# Patient Record
Sex: Female | Born: 1950 | Race: Black or African American | Hispanic: No | Smoking: Never smoker
Health system: Southern US, Community
[De-identification: ages and names within clinical notes are randomized; demographics above are authoritative.]

## PROBLEM LIST (undated history)

## (undated) DIAGNOSIS — E119 Type 2 diabetes mellitus without complications: Secondary | ICD-10-CM

## (undated) DIAGNOSIS — I214 Non-ST elevation (NSTEMI) myocardial infarction: Secondary | ICD-10-CM

## (undated) DIAGNOSIS — I82409 Acute embolism and thrombosis of unspecified deep veins of unspecified lower extremity: Secondary | ICD-10-CM

## (undated) DIAGNOSIS — D6851 Activated protein C resistance: Secondary | ICD-10-CM

## (undated) DIAGNOSIS — D689 Coagulation defect, unspecified: Secondary | ICD-10-CM

## (undated) DIAGNOSIS — K219 Gastro-esophageal reflux disease without esophagitis: Secondary | ICD-10-CM

## (undated) DIAGNOSIS — K5900 Constipation, unspecified: Secondary | ICD-10-CM

## (undated) DIAGNOSIS — N289 Disorder of kidney and ureter, unspecified: Secondary | ICD-10-CM

## (undated) DIAGNOSIS — E039 Hypothyroidism, unspecified: Secondary | ICD-10-CM

## (undated) DIAGNOSIS — I10 Essential (primary) hypertension: Secondary | ICD-10-CM

## (undated) DIAGNOSIS — G4730 Sleep apnea, unspecified: Secondary | ICD-10-CM

## (undated) HISTORY — DX: Sleep apnea, unspecified: G47.30

## (undated) HISTORY — DX: Disorder of kidney and ureter, unspecified: N28.9

## (undated) HISTORY — DX: Constipation, unspecified: K59.00

## (undated) HISTORY — DX: Gastro-esophageal reflux disease without esophagitis: K21.9

## (undated) HISTORY — DX: Coagulation defect, unspecified: D68.9

## (undated) HISTORY — DX: Activated protein C resistance: D68.51

## (undated) HISTORY — DX: Hypothyroidism, unspecified: E03.9

## (undated) HISTORY — DX: Acute embolism and thrombosis of unspecified deep veins of unspecified lower extremity: I82.409

## (undated) HISTORY — PX: COLONOSCOPY: SHX174

## (undated) HISTORY — DX: Non-ST elevation (NSTEMI) myocardial infarction: I21.4

## (undated) HISTORY — PX: UPPER GASTROINTESTINAL ENDOSCOPY: SHX188

## (undated) HISTORY — PX: INGUINAL HERNIA REPAIR: SUR1180

---

## 1993-02-18 HISTORY — PX: TUBAL LIGATION: SHX77

## 1997-09-13 ENCOUNTER — Other Ambulatory Visit: Admission: RE | Admit: 1997-09-13 | Discharge: 1997-09-13 | Payer: Self-pay | Admitting: *Deleted

## 1998-02-14 ENCOUNTER — Other Ambulatory Visit: Admission: RE | Admit: 1998-02-14 | Discharge: 1998-02-14 | Payer: Self-pay | Admitting: *Deleted

## 1998-12-11 ENCOUNTER — Encounter: Payer: Self-pay | Admitting: Family Medicine

## 1998-12-11 ENCOUNTER — Encounter: Admission: RE | Admit: 1998-12-11 | Discharge: 1998-12-11 | Payer: Self-pay | Admitting: Family Medicine

## 1999-09-19 ENCOUNTER — Other Ambulatory Visit: Admission: RE | Admit: 1999-09-19 | Discharge: 1999-09-19 | Payer: Self-pay | Admitting: Obstetrics and Gynecology

## 1999-09-27 ENCOUNTER — Ambulatory Visit (HOSPITAL_COMMUNITY): Admission: RE | Admit: 1999-09-27 | Discharge: 1999-09-27 | Payer: Self-pay | Admitting: Obstetrics and Gynecology

## 1999-09-27 ENCOUNTER — Encounter: Payer: Self-pay | Admitting: Obstetrics and Gynecology

## 1999-10-10 ENCOUNTER — Encounter: Payer: Self-pay | Admitting: Obstetrics and Gynecology

## 1999-10-10 ENCOUNTER — Encounter: Admission: RE | Admit: 1999-10-10 | Discharge: 1999-10-10 | Payer: Self-pay | Admitting: Obstetrics and Gynecology

## 2001-04-20 ENCOUNTER — Ambulatory Visit (HOSPITAL_COMMUNITY): Admission: RE | Admit: 2001-04-20 | Discharge: 2001-04-20 | Payer: Self-pay | Admitting: *Deleted

## 2001-08-26 ENCOUNTER — Other Ambulatory Visit: Admission: RE | Admit: 2001-08-26 | Discharge: 2001-08-26 | Payer: Self-pay | Admitting: Obstetrics and Gynecology

## 2001-08-28 ENCOUNTER — Other Ambulatory Visit: Admission: RE | Admit: 2001-08-28 | Discharge: 2001-08-28 | Payer: Self-pay | Admitting: Radiology

## 2001-08-28 ENCOUNTER — Encounter: Admission: RE | Admit: 2001-08-28 | Discharge: 2001-08-28 | Payer: Self-pay | Admitting: Obstetrics and Gynecology

## 2001-08-28 ENCOUNTER — Encounter (INDEPENDENT_AMBULATORY_CARE_PROVIDER_SITE_OTHER): Payer: Self-pay | Admitting: Specialist

## 2001-08-28 ENCOUNTER — Encounter: Payer: Self-pay | Admitting: Obstetrics and Gynecology

## 2003-01-26 ENCOUNTER — Inpatient Hospital Stay (HOSPITAL_COMMUNITY): Admission: EM | Admit: 2003-01-26 | Discharge: 2003-02-03 | Payer: Self-pay | Admitting: Emergency Medicine

## 2003-01-27 ENCOUNTER — Encounter (INDEPENDENT_AMBULATORY_CARE_PROVIDER_SITE_OTHER): Payer: Self-pay | Admitting: *Deleted

## 2003-02-08 ENCOUNTER — Encounter: Admission: RE | Admit: 2003-02-08 | Discharge: 2003-02-08 | Payer: Self-pay | Admitting: Family Medicine

## 2003-02-28 ENCOUNTER — Encounter: Admission: RE | Admit: 2003-02-28 | Discharge: 2003-02-28 | Payer: Self-pay | Admitting: Sports Medicine

## 2003-03-16 ENCOUNTER — Encounter: Admission: RE | Admit: 2003-03-16 | Discharge: 2003-03-16 | Payer: Self-pay | Admitting: Family Medicine

## 2003-04-21 ENCOUNTER — Encounter: Admission: RE | Admit: 2003-04-21 | Discharge: 2003-04-21 | Payer: Self-pay | Admitting: Obstetrics and Gynecology

## 2003-12-19 ENCOUNTER — Ambulatory Visit: Payer: Self-pay | Admitting: Sports Medicine

## 2003-12-26 ENCOUNTER — Ambulatory Visit: Payer: Self-pay | Admitting: Family Medicine

## 2004-04-11 ENCOUNTER — Encounter: Admission: RE | Admit: 2004-04-11 | Discharge: 2004-04-11 | Payer: Self-pay | Admitting: Gynecology

## 2004-06-06 ENCOUNTER — Ambulatory Visit: Payer: Self-pay | Admitting: Sports Medicine

## 2004-06-06 ENCOUNTER — Ambulatory Visit (HOSPITAL_COMMUNITY): Admission: RE | Admit: 2004-06-06 | Discharge: 2004-06-06 | Payer: Self-pay | Admitting: Sports Medicine

## 2004-06-07 ENCOUNTER — Ambulatory Visit: Payer: Self-pay | Admitting: Sports Medicine

## 2004-06-18 ENCOUNTER — Ambulatory Visit: Payer: Self-pay | Admitting: Sports Medicine

## 2004-12-31 ENCOUNTER — Ambulatory Visit: Payer: Self-pay | Admitting: Family Medicine

## 2005-01-21 ENCOUNTER — Ambulatory Visit: Payer: Self-pay | Admitting: Family Medicine

## 2005-02-14 ENCOUNTER — Ambulatory Visit: Payer: Self-pay | Admitting: Sports Medicine

## 2005-03-20 ENCOUNTER — Ambulatory Visit: Payer: Self-pay | Admitting: Family Medicine

## 2005-04-09 ENCOUNTER — Ambulatory Visit: Payer: Self-pay | Admitting: Sports Medicine

## 2005-05-19 ENCOUNTER — Encounter (INDEPENDENT_AMBULATORY_CARE_PROVIDER_SITE_OTHER): Payer: Self-pay | Admitting: *Deleted

## 2005-05-19 LAB — CONVERTED CEMR LAB

## 2005-06-05 ENCOUNTER — Ambulatory Visit: Payer: Self-pay | Admitting: Sports Medicine

## 2005-06-13 ENCOUNTER — Ambulatory Visit: Payer: Self-pay | Admitting: Family Medicine

## 2005-06-20 ENCOUNTER — Encounter: Admission: RE | Admit: 2005-06-20 | Discharge: 2005-06-20 | Payer: Self-pay | Admitting: *Deleted

## 2005-07-08 ENCOUNTER — Encounter: Admission: RE | Admit: 2005-07-08 | Discharge: 2005-07-08 | Payer: Self-pay | Admitting: *Deleted

## 2005-07-12 ENCOUNTER — Ambulatory Visit: Payer: Self-pay | Admitting: Family Medicine

## 2005-07-18 ENCOUNTER — Ambulatory Visit: Payer: Self-pay | Admitting: Family Medicine

## 2005-07-31 ENCOUNTER — Ambulatory Visit: Payer: Self-pay | Admitting: Obstetrics and Gynecology

## 2005-11-28 ENCOUNTER — Ambulatory Visit: Payer: Self-pay | Admitting: Family Medicine

## 2005-11-28 ENCOUNTER — Inpatient Hospital Stay (HOSPITAL_COMMUNITY): Admission: EM | Admit: 2005-11-28 | Discharge: 2005-12-01 | Payer: Self-pay | Admitting: Family Medicine

## 2005-11-28 ENCOUNTER — Encounter: Payer: Self-pay | Admitting: Vascular Surgery

## 2005-12-02 ENCOUNTER — Ambulatory Visit: Payer: Self-pay | Admitting: Sports Medicine

## 2005-12-04 ENCOUNTER — Emergency Department (HOSPITAL_COMMUNITY): Admission: EM | Admit: 2005-12-04 | Discharge: 2005-12-05 | Payer: Self-pay | Admitting: Emergency Medicine

## 2005-12-05 ENCOUNTER — Ambulatory Visit: Payer: Self-pay | Admitting: Family Medicine

## 2005-12-07 ENCOUNTER — Emergency Department (HOSPITAL_COMMUNITY): Admission: EM | Admit: 2005-12-07 | Discharge: 2005-12-07 | Payer: Self-pay | Admitting: Emergency Medicine

## 2005-12-16 ENCOUNTER — Ambulatory Visit: Payer: Self-pay | Admitting: Sports Medicine

## 2005-12-19 ENCOUNTER — Ambulatory Visit: Payer: Self-pay | Admitting: Family Medicine

## 2005-12-26 ENCOUNTER — Ambulatory Visit: Payer: Self-pay | Admitting: Sports Medicine

## 2005-12-30 ENCOUNTER — Ambulatory Visit: Payer: Self-pay | Admitting: Family Medicine

## 2006-01-13 ENCOUNTER — Ambulatory Visit: Payer: Self-pay | Admitting: Family Medicine

## 2006-01-27 ENCOUNTER — Ambulatory Visit: Payer: Self-pay | Admitting: Family Medicine

## 2006-02-19 ENCOUNTER — Ambulatory Visit: Payer: Self-pay | Admitting: Family Medicine

## 2006-03-12 ENCOUNTER — Ambulatory Visit: Payer: Self-pay | Admitting: Family Medicine

## 2006-03-26 ENCOUNTER — Ambulatory Visit: Payer: Self-pay | Admitting: Family Medicine

## 2006-04-09 ENCOUNTER — Ambulatory Visit: Payer: Self-pay | Admitting: Family Medicine

## 2006-04-17 DIAGNOSIS — I10 Essential (primary) hypertension: Secondary | ICD-10-CM | POA: Insufficient documentation

## 2006-04-17 DIAGNOSIS — E039 Hypothyroidism, unspecified: Secondary | ICD-10-CM | POA: Insufficient documentation

## 2006-04-17 DIAGNOSIS — I80299 Phlebitis and thrombophlebitis of other deep vessels of unspecified lower extremity: Secondary | ICD-10-CM | POA: Insufficient documentation

## 2006-04-18 ENCOUNTER — Encounter (INDEPENDENT_AMBULATORY_CARE_PROVIDER_SITE_OTHER): Payer: Self-pay | Admitting: *Deleted

## 2006-04-24 ENCOUNTER — Ambulatory Visit: Payer: Self-pay | Admitting: Sports Medicine

## 2006-04-24 LAB — CONVERTED CEMR LAB
INR: 1.7
Prothrombin Time: 20.9 s

## 2006-05-07 ENCOUNTER — Encounter: Payer: Self-pay | Admitting: Family Medicine

## 2006-05-07 ENCOUNTER — Ambulatory Visit: Payer: Self-pay | Admitting: Family Medicine

## 2006-05-07 LAB — CONVERTED CEMR LAB
INR: 2.3
Prothrombin Time: 27.6 s
TSH: 2.635 microintl units/mL (ref 0.350–5.50)

## 2006-05-08 ENCOUNTER — Telehealth: Payer: Self-pay | Admitting: Family Medicine

## 2006-05-21 ENCOUNTER — Ambulatory Visit: Payer: Self-pay | Admitting: Family Medicine

## 2006-05-21 LAB — CONVERTED CEMR LAB: INR: 1.5

## 2006-06-13 ENCOUNTER — Ambulatory Visit: Payer: Self-pay | Admitting: Family Medicine

## 2006-07-28 ENCOUNTER — Emergency Department (HOSPITAL_COMMUNITY): Admission: EM | Admit: 2006-07-28 | Discharge: 2006-07-29 | Payer: Self-pay | Admitting: Emergency Medicine

## 2006-07-29 ENCOUNTER — Telehealth: Payer: Self-pay | Admitting: *Deleted

## 2006-07-30 ENCOUNTER — Ambulatory Visit: Payer: Self-pay | Admitting: Family Medicine

## 2006-07-30 DIAGNOSIS — I872 Venous insufficiency (chronic) (peripheral): Secondary | ICD-10-CM | POA: Insufficient documentation

## 2006-09-26 ENCOUNTER — Telehealth (INDEPENDENT_AMBULATORY_CARE_PROVIDER_SITE_OTHER): Payer: Self-pay | Admitting: *Deleted

## 2006-09-29 ENCOUNTER — Ambulatory Visit: Payer: Self-pay | Admitting: Sports Medicine

## 2006-09-29 DIAGNOSIS — I808 Phlebitis and thrombophlebitis of other sites: Secondary | ICD-10-CM | POA: Insufficient documentation

## 2006-11-20 ENCOUNTER — Encounter: Admission: RE | Admit: 2006-11-20 | Discharge: 2006-11-20 | Payer: Self-pay | Admitting: Internal Medicine

## 2007-11-25 ENCOUNTER — Encounter: Admission: RE | Admit: 2007-11-25 | Discharge: 2007-11-25 | Payer: Self-pay | Admitting: Internal Medicine

## 2007-11-27 ENCOUNTER — Ambulatory Visit: Payer: Self-pay | Admitting: Internal Medicine

## 2007-11-27 DIAGNOSIS — R05 Cough: Secondary | ICD-10-CM

## 2007-11-27 DIAGNOSIS — R059 Cough, unspecified: Secondary | ICD-10-CM | POA: Insufficient documentation

## 2007-11-27 LAB — CONVERTED CEMR LAB
Basophils Absolute: 0 10*3/uL (ref 0.0–0.1)
Basophils Relative: 0 % (ref 0.0–3.0)
Eosinophils Absolute: 0.1 10*3/uL (ref 0.0–0.7)
Eosinophils Relative: 1.8 % (ref 0.0–5.0)
HCT: 37.4 % (ref 36.0–46.0)
Hemoglobin: 12.6 g/dL (ref 12.0–15.0)
IgE (Immunoglobulin E), Serum: 323.6 intl units/mL — ABNORMAL HIGH (ref 0.0–180.0)
Lymphocytes Relative: 30.4 % (ref 12.0–46.0)
MCHC: 33.5 g/dL (ref 30.0–36.0)
MCV: 85.7 fL (ref 78.0–100.0)
Monocytes Absolute: 0.7 10*3/uL (ref 0.1–1.0)
Monocytes Relative: 12.2 % — ABNORMAL HIGH (ref 3.0–12.0)
Neutro Abs: 3.2 10*3/uL (ref 1.4–7.7)
Neutrophils Relative %: 55.6 % (ref 43.0–77.0)
Platelets: 327 10*3/uL (ref 150–400)
RBC: 4.37 M/uL (ref 3.87–5.11)
RDW: 13.7 % (ref 11.5–14.6)
WBC: 5.8 10*3/uL (ref 4.5–10.5)

## 2007-12-03 ENCOUNTER — Ambulatory Visit: Payer: Self-pay | Admitting: Internal Medicine

## 2008-01-05 ENCOUNTER — Ambulatory Visit: Payer: Self-pay | Admitting: Internal Medicine

## 2008-06-17 ENCOUNTER — Emergency Department (HOSPITAL_COMMUNITY): Admission: EM | Admit: 2008-06-17 | Discharge: 2008-06-18 | Payer: Self-pay | Admitting: Emergency Medicine

## 2008-06-18 ENCOUNTER — Emergency Department (HOSPITAL_COMMUNITY): Admission: EM | Admit: 2008-06-18 | Discharge: 2008-06-18 | Payer: Self-pay | Admitting: Emergency Medicine

## 2008-07-27 ENCOUNTER — Encounter: Admission: RE | Admit: 2008-07-27 | Discharge: 2008-07-27 | Payer: Self-pay | Admitting: Internal Medicine

## 2008-07-27 ENCOUNTER — Inpatient Hospital Stay (HOSPITAL_COMMUNITY): Admission: EM | Admit: 2008-07-27 | Discharge: 2008-07-28 | Payer: Self-pay | Admitting: Emergency Medicine

## 2008-07-28 ENCOUNTER — Encounter (INDEPENDENT_AMBULATORY_CARE_PROVIDER_SITE_OTHER): Payer: Self-pay | Admitting: Internal Medicine

## 2008-09-28 ENCOUNTER — Ambulatory Visit: Payer: Self-pay | Admitting: Oncology

## 2008-10-11 LAB — LUPUS ANTICOAGULANT PANEL
DRVVT 1:1 Mix: 39.8 secs (ref 36.1–47.0)
DRVVT: 60.5 secs — ABNORMAL HIGH (ref 36.1–47.0)
PTT Lupus Anticoagulant: 45.8 secs (ref 36.3–48.8)

## 2008-10-11 LAB — PROTHROMBIN GENE MUTATION

## 2008-10-11 LAB — BETA-2 GLYCOPROTEIN ANTIBODIES
Beta-2 Glyco I IgG: <20 U/mL (ref <20)
Beta-2-Glycoprotein I IgA: <10 U/mL (ref <20)
Beta-2-Glycoprotein I IgM: <10 U/mL (ref <20)

## 2008-10-11 LAB — FACTOR 8 ASSAY: Coagulation Factor VIII: 215 % — ABNORMAL HIGH (ref 73–140)

## 2009-02-18 HISTORY — PX: TRANSTHORACIC ECHOCARDIOGRAM: SHX275

## 2009-02-18 HISTORY — PX: OTHER SURGICAL HISTORY: SHX169

## 2009-06-21 ENCOUNTER — Ambulatory Visit: Payer: Self-pay | Admitting: Oncology

## 2009-06-21 ENCOUNTER — Other Ambulatory Visit: Payer: Self-pay | Admitting: Oncology

## 2009-06-21 ENCOUNTER — Ambulatory Visit: Payer: Self-pay | Admitting: Vascular Surgery

## 2009-06-21 ENCOUNTER — Ambulatory Visit: Admission: RE | Admit: 2009-06-21 | Discharge: 2009-06-21 | Payer: Self-pay | Admitting: Oncology

## 2009-06-21 ENCOUNTER — Encounter: Payer: Self-pay | Admitting: Oncology

## 2009-06-21 LAB — CBC WITH DIFFERENTIAL/PLATELET
BASO%: 0.3 % (ref 0.0–2.0)
Basophils Absolute: 0 10*3/uL (ref 0.0–0.1)
EOS%: 1.9 % (ref 0.0–7.0)
Eosinophils Absolute: 0.1 10*3/uL (ref 0.0–0.5)
HCT: 37.5 % (ref 34.8–46.6)
HGB: 12.6 g/dL (ref 11.6–15.9)
LYMPH%: 38.7 % (ref 14.0–49.7)
MCH: 27.5 pg (ref 25.1–34.0)
MCHC: 33.6 g/dL (ref 31.5–36.0)
MCV: 81.7 fL (ref 79.5–101.0)
MONO#: 0.5 10*3/uL (ref 0.1–0.9)
MONO%: 7.7 % (ref 0.0–14.0)
NEUT#: 3 10*3/uL (ref 1.5–6.5)
NEUT%: 51.4 % (ref 38.4–76.8)
Platelets: 318 10*3/uL (ref 145–400)
RBC: 4.59 10*6/uL (ref 3.70–5.45)
RDW: 14.7 % — ABNORMAL HIGH (ref 11.2–14.5)
WBC: 5.8 10*3/uL (ref 3.9–10.3)
lymph#: 2.3 10*3/uL (ref 0.9–3.3)
nRBC: 0 % (ref 0–0)

## 2009-06-21 LAB — PROTIME-INR
INR: 1 — ABNORMAL LOW (ref 2.00–3.50)
Protime: 12 Seconds (ref 10.6–13.4)

## 2009-06-22 LAB — COMPREHENSIVE METABOLIC PANEL
ALT: 14 U/L (ref 0–35)
AST: 21 U/L (ref 0–37)
Albumin: 4.3 g/dL (ref 3.5–5.2)
Alkaline Phosphatase: 65 U/L (ref 39–117)
BUN: 14 mg/dL (ref 6–23)
CO2: 24 mEq/L (ref 19–32)
Calcium: 9.3 mg/dL (ref 8.4–10.5)
Chloride: 105 mEq/L (ref 96–112)
Creatinine, Ser: 1.13 mg/dL (ref 0.40–1.20)
Glucose, Bld: 121 mg/dL — ABNORMAL HIGH (ref 70–99)
Potassium: 3.8 mEq/L (ref 3.5–5.3)
Sodium: 139 mEq/L (ref 135–145)
Total Bilirubin: 0.5 mg/dL (ref 0.3–1.2)
Total Protein: 7.6 g/dL (ref 6.0–8.3)

## 2009-06-22 LAB — D-DIMER, QUANTITATIVE: D-Dimer, Quant: 0.3 ug/mL-FEU (ref 0.00–0.48)

## 2009-06-22 LAB — FACTOR 8 ASSAY: Coagulation Factor VIII: 206 % — ABNORMAL HIGH (ref 73–140)

## 2009-06-22 LAB — FIBRINOGEN: Fibrinogen: 444 mg/dL (ref 204–475)

## 2009-08-07 ENCOUNTER — Encounter: Payer: Self-pay | Admitting: Internal Medicine

## 2009-09-05 IMAGING — US US EXTREM LOW VENOUS*R*
1 series · 14 of 24 positions shown · non-contrast
Comparison: None.

CLINICAL DATA: Right leg pain



[Series 1: us extrem low venous*right* · 14 of 32 slices shown]
[im 1/32]
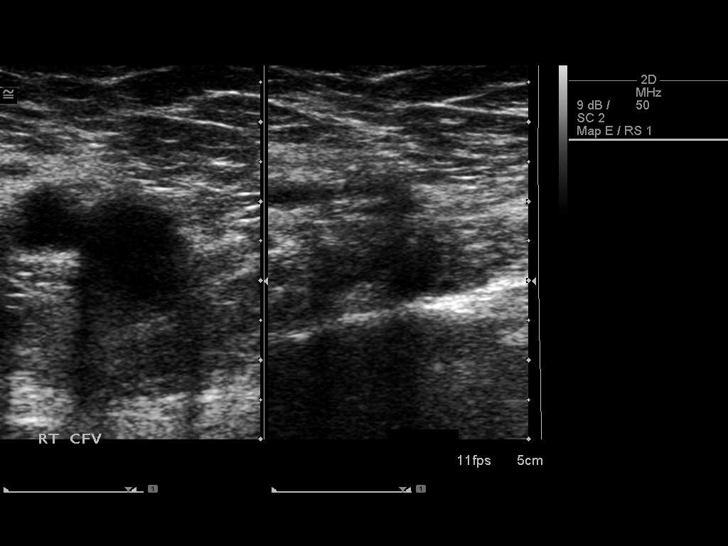
[im 3/32]
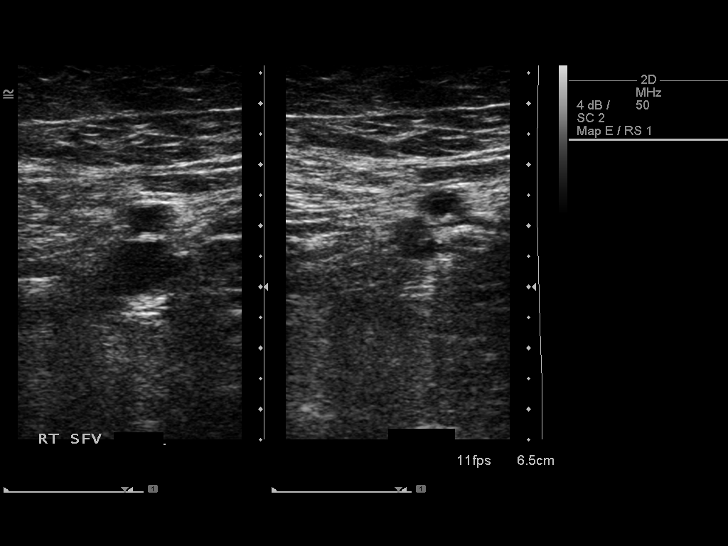
[im 6/32]
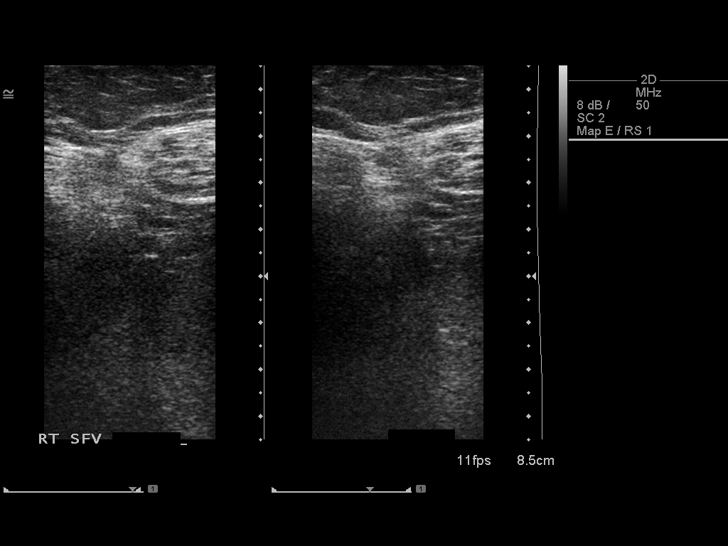
[im 9/32]
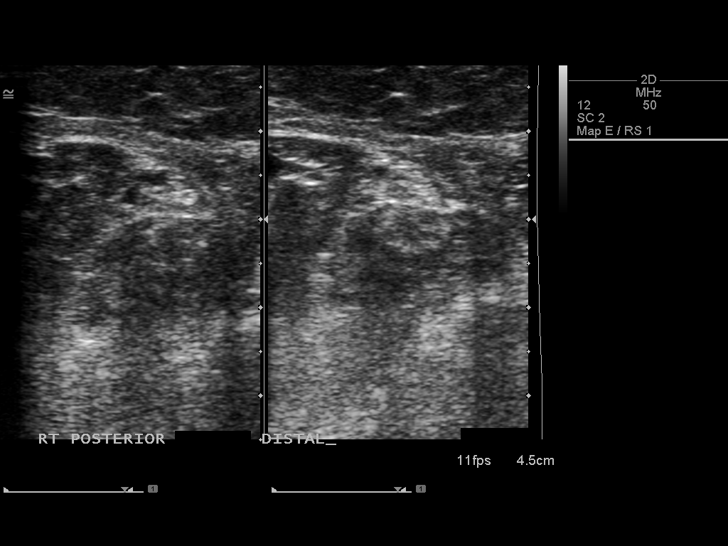
[im 10/32]
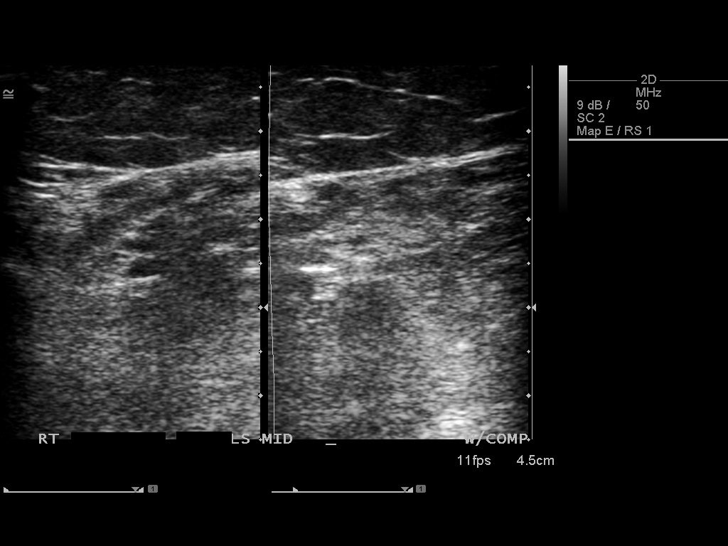
[im 13/32]
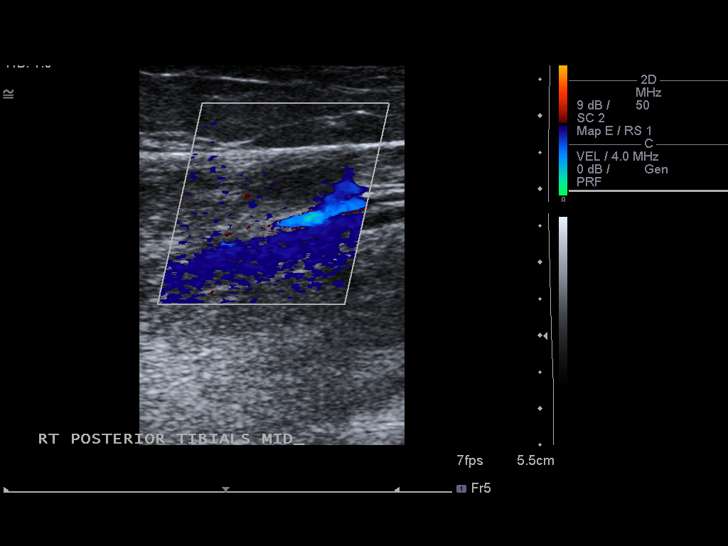
[im 15/32]
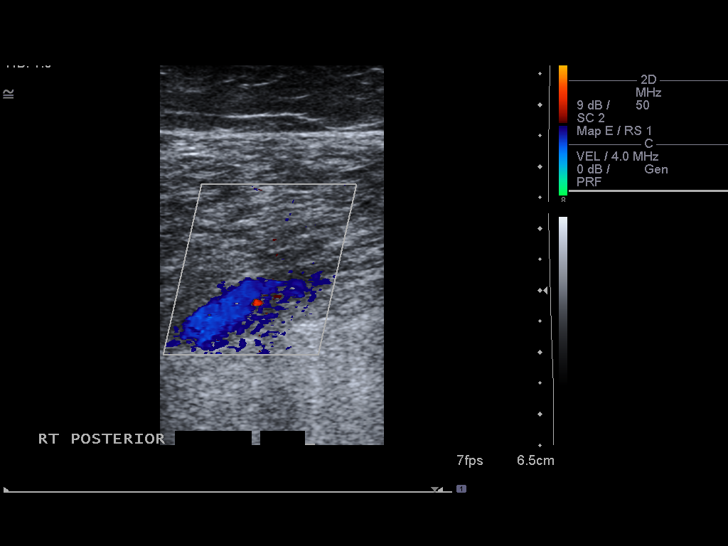
[im 17/32]
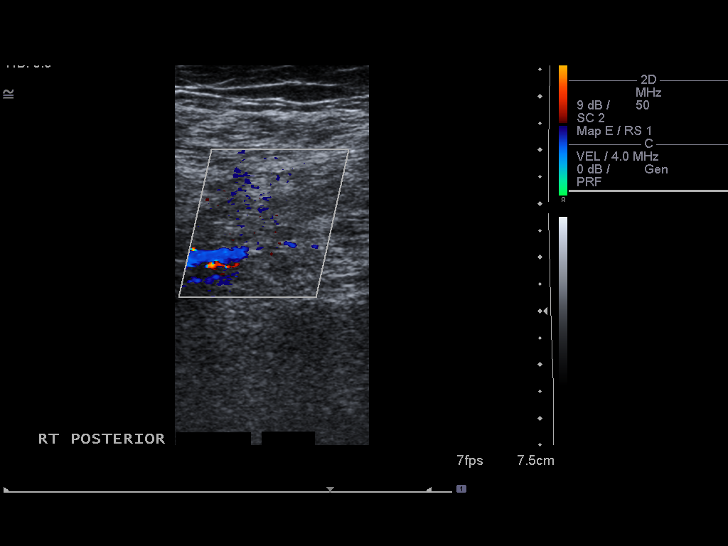
[im 19/32]
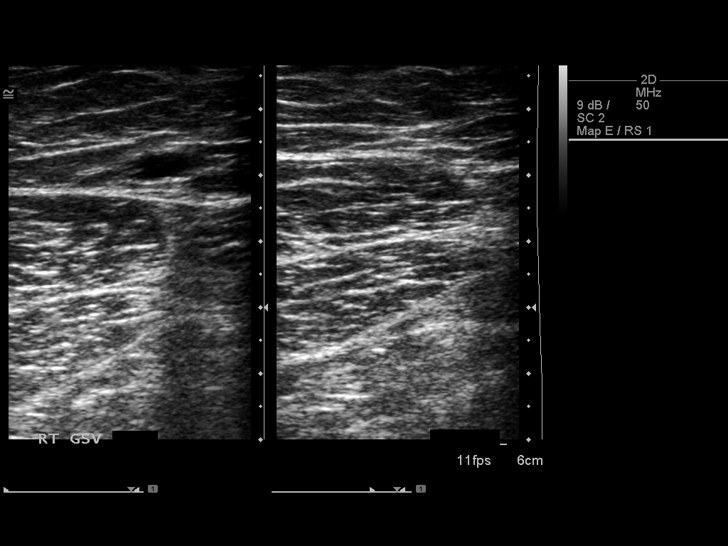
[im 22/32]
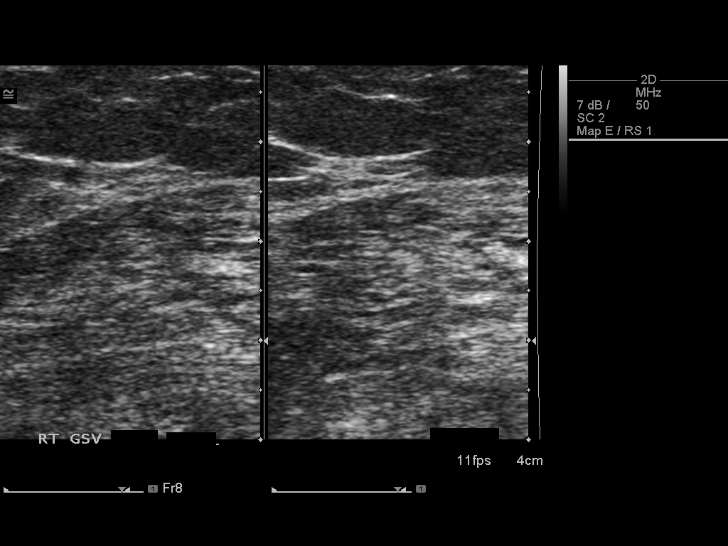
[im 25/32]
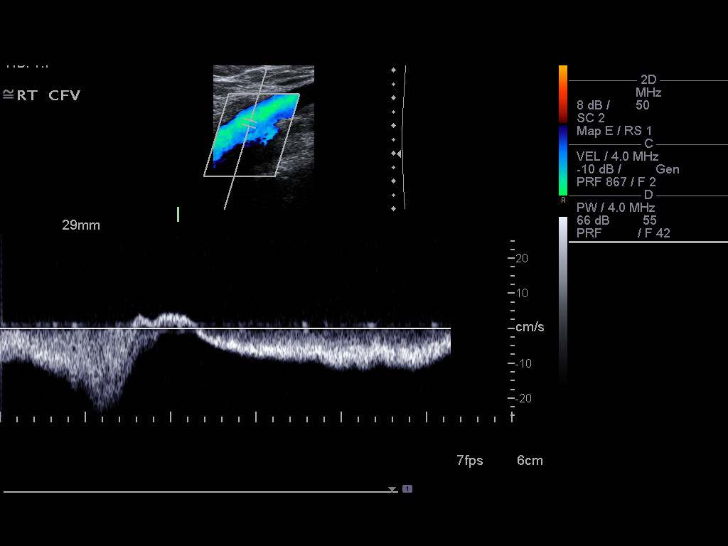
[im 26/32]
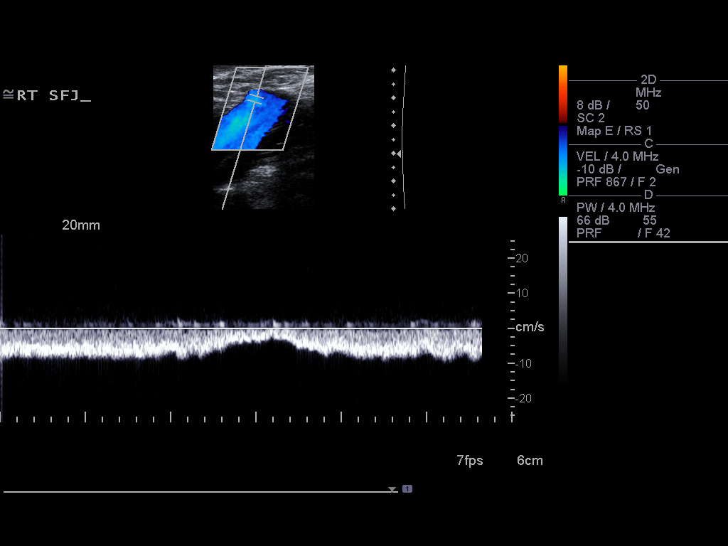
[im 29/32]
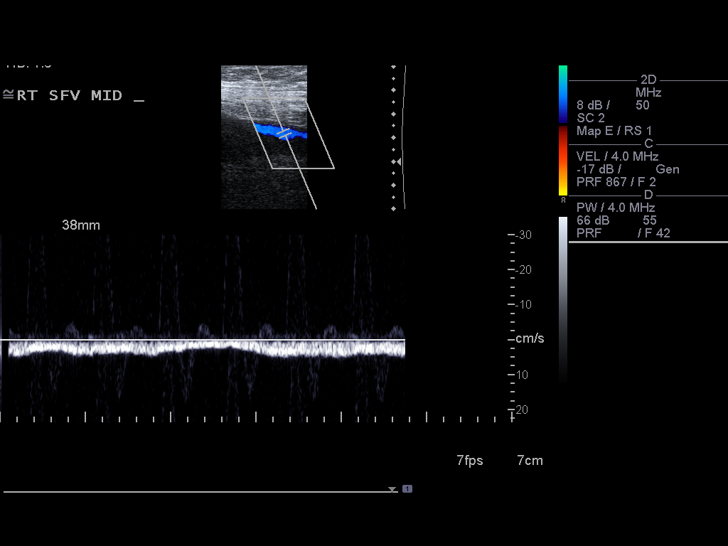
[im 32/32]
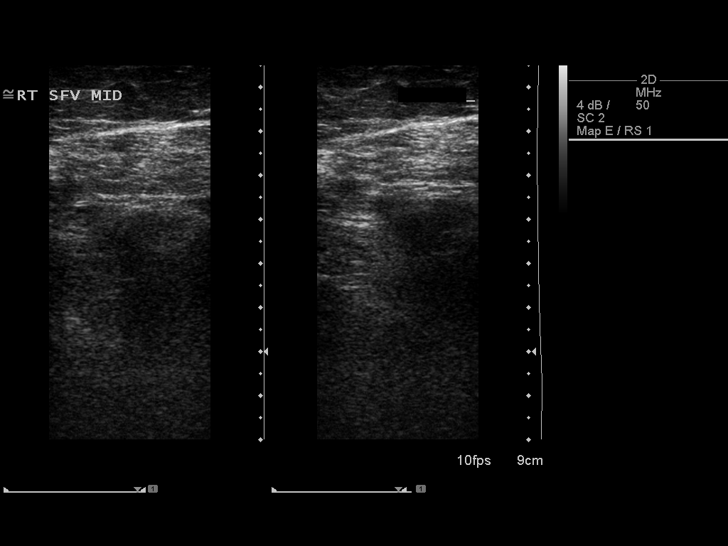

[14 of 24 positions shown; findings below may reference images not displayed]

FINDINGS: The right saphenofemoral junction, common femoral and
profunda femoral veins are normal with compressibility and
phasicity.  Within the mid and distal right superficial femoral
vein, there is hypoechoic nonocclusive clot identified.  There is
venous flow adjacent to the nonocclusive clot.  The right G S V is
normal.  Posterior tibial veins are also patent and compressible.
IMPRESSION: Right mid and distal superficial femoral nonocclusive DVT. This may
be subacute and already partially recanalized.

Critical test results telephoned to Dr. Rautela at the time of
interpretation on 07/27/2008 at [DATE] p.m.

## 2009-09-12 ENCOUNTER — Encounter: Payer: Self-pay | Admitting: Internal Medicine

## 2009-10-09 ENCOUNTER — Encounter: Payer: Self-pay | Admitting: Internal Medicine

## 2009-10-21 ENCOUNTER — Inpatient Hospital Stay (HOSPITAL_COMMUNITY): Admission: EM | Admit: 2009-10-21 | Discharge: 2009-10-28 | Payer: Self-pay | Admitting: Cardiovascular Disease

## 2009-10-21 ENCOUNTER — Encounter: Payer: Self-pay | Admitting: Emergency Medicine

## 2009-10-21 HISTORY — PX: CARDIAC CATHETERIZATION: SHX172

## 2009-10-24 ENCOUNTER — Encounter (INDEPENDENT_AMBULATORY_CARE_PROVIDER_SITE_OTHER): Payer: Self-pay | Admitting: Cardiovascular Disease

## 2009-10-27 ENCOUNTER — Telehealth (INDEPENDENT_AMBULATORY_CARE_PROVIDER_SITE_OTHER): Payer: Self-pay | Admitting: *Deleted

## 2009-11-17 ENCOUNTER — Encounter: Admission: RE | Admit: 2009-11-17 | Discharge: 2009-11-17 | Payer: Self-pay | Admitting: Internal Medicine

## 2009-11-19 ENCOUNTER — Encounter (HOSPITAL_COMMUNITY)
Admission: RE | Admit: 2009-11-19 | Discharge: 2010-02-17 | Payer: Self-pay | Source: Home / Self Care | Attending: Internal Medicine | Admitting: Internal Medicine

## 2010-01-02 ENCOUNTER — Ambulatory Visit (HOSPITAL_COMMUNITY): Admission: RE | Admit: 2010-01-02 | Discharge: 2010-01-02 | Payer: Self-pay | Admitting: Cardiovascular Disease

## 2010-02-18 ENCOUNTER — Encounter (HOSPITAL_COMMUNITY)
Admission: RE | Admit: 2010-02-18 | Discharge: 2010-03-20 | Payer: Self-pay | Source: Home / Self Care | Attending: Internal Medicine | Admitting: Internal Medicine

## 2010-02-23 LAB — GLUCOSE, CAPILLARY: Glucose-Capillary: 148 mg/dL — ABNORMAL HIGH (ref 70–99)

## 2010-03-11 ENCOUNTER — Encounter: Payer: Self-pay | Admitting: Obstetrics and Gynecology

## 2010-03-11 ENCOUNTER — Encounter: Payer: Self-pay | Admitting: *Deleted

## 2010-03-13 LAB — GLUCOSE, CAPILLARY: Glucose-Capillary: 127 mg/dL — ABNORMAL HIGH (ref 70–99)

## 2010-03-21 ENCOUNTER — Encounter (HOSPITAL_COMMUNITY): Payer: Self-pay | Attending: Internal Medicine

## 2010-03-21 DIAGNOSIS — Z7901 Long term (current) use of anticoagulants: Secondary | ICD-10-CM | POA: Insufficient documentation

## 2010-03-21 DIAGNOSIS — Z5189 Encounter for other specified aftercare: Secondary | ICD-10-CM | POA: Insufficient documentation

## 2010-03-21 DIAGNOSIS — D649 Anemia, unspecified: Secondary | ICD-10-CM | POA: Insufficient documentation

## 2010-03-21 DIAGNOSIS — E039 Hypothyroidism, unspecified: Secondary | ICD-10-CM | POA: Insufficient documentation

## 2010-03-21 DIAGNOSIS — I214 Non-ST elevation (NSTEMI) myocardial infarction: Secondary | ICD-10-CM | POA: Insufficient documentation

## 2010-03-21 DIAGNOSIS — N289 Disorder of kidney and ureter, unspecified: Secondary | ICD-10-CM | POA: Insufficient documentation

## 2010-03-21 DIAGNOSIS — Z86718 Personal history of other venous thrombosis and embolism: Secondary | ICD-10-CM | POA: Insufficient documentation

## 2010-03-21 DIAGNOSIS — E119 Type 2 diabetes mellitus without complications: Secondary | ICD-10-CM | POA: Insufficient documentation

## 2010-03-21 DIAGNOSIS — G4733 Obstructive sleep apnea (adult) (pediatric): Secondary | ICD-10-CM | POA: Insufficient documentation

## 2010-03-21 DIAGNOSIS — I059 Rheumatic mitral valve disease, unspecified: Secondary | ICD-10-CM | POA: Insufficient documentation

## 2010-03-22 NOTE — Progress Notes (Signed)
  Phone Note Other Incoming   Request: Send information Summary of Call: Request for records received from [facility]. Request forwarded to [facility].     

## 2010-03-22 NOTE — Progress Notes (Signed)
Summary: wi request  Phone Note Call from Patient Call back at Home Phone [phone]   Reason for Call: Talk to Nurse Summary of Call: pt is requesting wi appt, she sts she went to the ER last night & was told to fu with her doctor Initial call taken by: ERIN LEVAN,  July 29, 2006 2:09 PM  Follow-up for Phone Call        message left to return call Follow-up by: Theresia Lo RN,  July 29, 2006 2:34 PM  Additional Follow-up for Phone Call Additional follow up Details #1::        reports went to ER last night for severe rt calf pain and ankle swelling. has history of DVT. no evidence of DVT noted last night. was told to f/u with PCP. appointment scheduled tomorrow. Additional Follow-up by: Theresia Lo RN,  July 29, 2006 3:05 PM

## 2010-03-22 NOTE — Assessment & Plan Note (Signed)
Summary: clot to rt arm , discoloration/ls    Vital Signs:  Patient Profile:   60 Years Old Female Height:     64.75 inches Weight:      197 pounds BMI:     33.16 Pulse rate:   80 / minute BP sitting:   142 / 81  Vitals Entered By: Lillia Pauls CMA (September 29, 2006 9:43 AM)               Chief Complaint:  L ARM BRUISE AND HARDNESS WITH PAIN.  History of Present Illness: 60 yo AAF with hx of HTN, DVT ( 2 to trauma , on coumadin from 10/07 to 04/08) c/o L forearm spontaneous echymoses , now with a "lump" . Ecchymosis has been present for 3 wks (  ~ a 3 cm diameter area on the upper 1/3 of the L forearm) . She has noticed a nodule , mildly tender on palpation in the center of the echymosis.   Denies trauma , SOB, CP, L extremity pain/ sensory or motor deficit.  There is a nodule of  ~ 6 to 8 mm of diammeter in the center of the ecchymosis of top of a superficial vein.  NO swelling/ erythema/ calor. Denies fever         Physical Exam  General:     Well-developed,well-nourished,in no acute distress; alert,appropriate and cooperative throughout examination Lungs:     Normal respiratory effort, chest expands symmetrically. Lungs are clear to auscultation, no crackles or wheezes. Heart:     Normal rate and regular rhythm. S1 and S2 normal without gallop, murmur, click, rub or other extra sounds. Pulses:     2 + peripheral pulses x 4  Extremities:     no edema Neurologic:     alert & oriented X3.   5/5 x 4 . NO sensory or motor deficits. Skin:     1-   ~ a 3 cm diameter area of ecchymosis  on the upper 1/3 of the L forearm ( extension surface) 2- nodule of  ~ 6 to 8 mm of diammeter in the center of the ecchymosis of top of a superficial vein. Hard rubber consistency, partially  attached to underlying tissue , mildly tender on palpation     Impression & Recommendations:  Problem # 1:  PHLEBITIS, SUPERFICIAL VEINS, UPPER LIMB (ICD-451.82) Based on physical findings ,  ecchymosis plus nodule most likely 2 to superficial phlebitis now on resolution. Preceptor : Dr. Leveda Anna. Plan: observation. Nodule and ecchymosis are spected to reduce their size with time. PCP to f/u. If increased size or erythema, swelling, calor, tenderness, sob, cp pt is to return to [facility] or ER. Pt will make app. for F/u in 3 wks and . Also  to meet new MD           Appended Document: Orders Update     Clinical Lists Changes  Orders: Added new Test order of [facility]- Est Level  3 (04540) - Signed

## 2010-03-22 NOTE — Progress Notes (Signed)
Summary: WI request   Phone Note Call from Patient Call back at [phone]   Reason for Call: Talk to Nurse Summary of Call: pt thinks she has a blood clot, isn't sure though Initial call taken by: Haydee Salter,  September 26, 2006 10:59 AM  Follow-up for Phone Call        Pt states she has a bruise on her left forearm that she sustained approx one week ago.  She noticed a couple of days ago that a knot had developed underneath the bruise, and her whole arm started hurting.  She is concerned about blood clot, states the area where the bruise is feels warm.  Worked pt in today at 1:30pm.  Follow-up by: Jacki Cones RN,  September 26, 2006 11:27 AM

## 2010-03-22 NOTE — Assessment & Plan Note (Signed)
Summary: skin test/apc   Vital Signs:  Patient Profile:   60 Years Old Female Height:     64.75 inches Weight:      205.38 pounds O2 Sat:      100 % O2 treatment:    Room Air Pulse rate:   87 / minute BP sitting:   130 / 78  (left arm) Cuff size:   regular  Vitals Entered By: Reynaldo Minium CMA (January 05, 2008 3:06 PM)             Comments Medications reviewed with patient Reynaldo Minium CMA  January 05, 2008 3:06 PM      PCP:  Kellie Shropshire  Chief Complaint:  allergy skin testing.  History of Present Illness:   From 11/27/07- 11/27/07- 33 year old woman self-referred for allergy evaluation.  New onset of cough, 8 to 10 weeks ago.  No prior history of allergy or asthma.  She thought she had a cold.  Antihistamines help, but cough returns when they are stopped.  Throat itches.  Eyes are red.  Denies sneeze, wheeze, phlegm.  No shortness of breath.  Comfortable supine.  Much indigestion, with a hiatal hernia.Taking a "digestive cleanser". House 1983, crawl space, gas heat, denies mold, pets, smokers  01/05/08- cough, hx DVT- For ST On amoxacillin from dentist.  CBC 10/9- EOS 1.8%. Immunocap- POS- mite, dust, [place] grass. IgE 323.6 CXR NAD, mild volume loss R base.  PFT 10/15-Mild restriction, reduced DLCO 66%.  Skin Test- POS - grass, tree, dust mite.      Prior Medications Reviewed Using: Patient Recall  Prior Medication List:  HYDROCHLOROTHIAZIDE 12.5 MG TABS (HYDROCHLOROTHIAZIDE) take 1 by mouth once daily SYNTHROID 175 MCG TABS (LEVOTHYROXINE SODIUM) take 1 by mouth once daily   Updated Prior Medication List: HYDROCHLOROTHIAZIDE 12.5 MG TABS (HYDROCHLOROTHIAZIDE) take 1 by mouth once daily SYNTHROID 175 MCG TABS (LEVOTHYROXINE SODIUM) take 1 by mouth once daily  Current Allergies (reviewed today): No known allergies   Past Medical History:    Reviewed history from 11/27/2007 and no changes required:       Fe deficient anemia in hospital as well,   h/o polio, age 58,        normal EKG July 06,        Pyelonephritis in Dec `04,        R leg length descrepancy 2 cm,        TSH 19.7 11/06       COUGH (ICD-786.2)- POS skin test 01/05/08       HYPOTHYROIDISM, UNSPECIFIED (ICD-244.9)       HYPERTENSION, BENIGN SYSTEMIC (ICD-401.1)       DEEP VEIN THROMBOPHLEBITIS, 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)       PHLEBITIS, SUPERFICIAL VEINS, UPPER LIMB (ICD-451.82)       VENOUS INSUFFICIENCY, CHRONIC, RIGHT LEG (ICD-459.81)       AFTERCARE, LONG-TERM USE, ANTICOAGULANTS (ICD-V58.61)            Review of Systems      See HPI   Physical Exam  General: A/Ox3; pleasant and cooperative, NAD, SKIN: no rash, lesions NODES: no lymphadenopathy HEENT: [place]/AT, EOM- WNL, Conjuctivae- clear, PERRLA, TM-WNL, Nose- clear, Throat- clear and wnl NECK: Supple w/ fair ROM, JVD- none, normal carotid impulses w/o bruits Thyroid- normal to palpation CHEST: Clear to P&A HEART: RRR, no m/g/r heard      Impression & Recommendations:  Problem # 1:  COUGH (ICD-786.2) Elevated IgE and positive skin tests would support allergic rhinitis or cough equivalent  asthma as basis for cough, but would not explain her hx that cough is of very recent onset wiithout change in exposure. We will give inhaled steroid, try antihistamine and educate on dust and pollen control, watching for effect.  Medications Added to Medication List This Visit: 1)  Qvar 80 Mcg/act Aers (Beclomethasone dipropionate) .Marland Kitchen.. 1 puff and rinse twice daily   Patient Instructions: 1)  Please schedule a follow-up appointment in 1 month. 2)  Sample Qvar 80, 1 puff and rinse twice daily 3)  Clarinex after skin test today 4)  Try Claritin antihistamine otc  as needed for cough to see if it helps 5)  Consider info I gave on avoiding dust and pollens   Prescriptions: QVAR 80 MCG/ACT AERS (BECLOMETHASONE DIPROPIONATE) 1 puff and rinse twice daily  #1 x prn   Entered and Authorized by:   Waymon Budge MD   Signed  by:   Waymon Budge MD on 01/05/2008   Method used:   Historical   RxID:   [id number]  ]  SPIROMETRY RESULTS  Date:12/03/2007 height inches: 64.75 Gender: Female  Parameter  Measured Predicted %Predicted  FVC      2.52        3.25      78  FEV1      2.22        2.58      86  FEV1/FVC      0.88        0.79      111.00  FEF25-75              2.58      0  PEF              398      0  Post-Bronchodilator  Parameter Measured Change from Baseline  FVC               FEV1               FEV1/FVC                FEF25-75                PEF               Pulse Oximetry Pulse: 87 O2 Saturation %: 100

## 2010-03-22 NOTE — Miscellaneous (Signed)
Summary: Skin Tests/[facility]  Skin Tests/ Allergy   Imported By: Lanelle Bal 01/27/2008 15:07:02  _____________________________________________________________________  External Attachment:    Type:   Image     Comment:   External Document

## 2010-03-22 NOTE — Assessment & Plan Note (Signed)
Summary: discuss d/c coumadin/dsl   Vital Signs:  Patient Profile:   60 Years Old Female Height:     64.75 inches Weight:      195.3 pounds Temp:     98.1 degrees F Pulse rate:   81 / minute BP sitting:   125 / 77  (right arm)  Pt. in pain?   yes    Location:   back    Intensity:   2    Type:       dull  Vitals Entered By: Theresia Lo RN (June 13, 2006 11:11 AM)                Chief Complaint:  discuss d/c coumadin.  History of Present Illness: On coumadin since 10/07 for RLE DVT.  Has completed 6 months of therapy.  Here for recheck.  Swelling has diminished but is still somewhat present.  Still having some pain along the anterior medial shin that is superficial in nature, not related to movement, but hurts with pressure on the superficial veins.    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  No hisotry of abnormal pap or STD   Risk Factors:  Tobacco use:  never    Physical Exam  General:     Well-developed,well-nourished,in no acute distress; alert,appropriate and cooperative throughout examination Lungs:     Normal respiratory effort, chest expands symmetrically. Lungs are clear to auscultation, no crackles or wheezes. Heart:     Normal rate and regular rhythm. S1 and S2 normal without gallop, murmur, click, rub or other extra sounds. Msk:     normal ROM and no joint  tenderness of r knee and ankle.  tender to palpation of the veins in the anterior shins. Pulses:     R popliteal normal, R posterior tibial normal, and R dorsalis pedis normal.   Extremities:     trace right pedal edema.   Neurologic:     No cranial nerve deficits noted. Station and gait are normal. Plantar reflexes are down-going bilaterally. DTRs are symmetrical throughout. Sensory, motor and coordinative functions appear intact.    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  Life long coumadin if reoccurs.  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  recommended heat and as needed nsaids.  Other Orders: [facility]- Est Level  3 (04540)

## 2010-03-22 NOTE — Assessment & Plan Note (Signed)
Summary: er f/u for rt calf pain/ls   Vital Signs:  Patient Profile:   60 Years Old Female Height:     64.75 inches Weight:      196 pounds Pulse rate:   95 / minute BP sitting:   134 / 78  Vitals Entered By: Lillia Pauls, CMA               Chief Complaint:  er fu for r calf pain and swelling.  History of Present Illness: Pt with swelling to RLE c pain in calf. Pt completed Coumadin treatment at end of April s/p DVT of affected leg. ER visit on Monday night showed neg. Ddimer. Pt reports swelling progressively worse throughout day.       Review of Systems  CV      Complains of swelling of feet.      Swelling to RLE distal to knee c pain in calf.   Physical Exam  General:     WNWD AA female Extremities:     3+ swelling to RLE c 2+ pedal pulse. No erythema of lesions noted. Skin:     turgor normal and color normal.      Impression & Recommendations:  Problem # 1:  VENOUS INSUFFICIENCY, CHRONIC, RIGHT LEG (ICD-459.81)  Orders: [facility]- Est Level  3 (62130) Differentials include post polio syndrome. Because swelling progressively worse throughout day and relieved when lying down, highly suspect venous insufficiency. Compression stockings discussed, pt verbalized understanding.    Patient Instructions: 1)  Please schedule a follow-up appointment as needed. You can buy compression stockings either online as discussed or at any medical supply store. Wear these throughout the day to decrease the swelling and discomfort.

## 2010-03-22 NOTE — Assessment & Plan Note (Signed)
Summary: cough / mbw   Vital Signs:  Patient Profile:   60 Years Old Female Height:     64.75 inches Weight:      204.38 pounds O2 Sat:      100 % O2 treatment:    Room Air Pulse rate:   80 / minute BP sitting:   120 / 68  (left arm) Cuff size:   regular  Vitals Entered By: Reynaldo Minium CMA (November 27, 2007 9:43 AM)             Comments Medications reviewed with patient Reynaldo Minium CMA  November 27, 2007 9:43 AM      Visit Type:  IME Initial PCP:  Kellie Shropshire  Chief Complaint:  allergy consult-cough; has tried Benadryl and Claritin; helps. .  History of Present Illness: Current Problems:  PHLEBITIS, SUPERFICIAL VEINS, UPPER LIMB (ICD-451.82) VENOUS INSUFFICIENCY, CHRONIC, RIGHT LEG (ICD-459.81) HYPOTHYROIDISM, UNSPECIFIED (ICD-244.9) HYPERTENSION, BENIGN SYSTEMIC (ICD-401.1) DEEP VEIN THROMBOPHLEBITIS, 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) HYPOTHYROIDISM NOS (ICD-244.9) AFTERCARE, LONG-TERM USE, ANTICOAGULANTS (ICD-V58.61) PHLEBITIS, DEEP VEINS, LOWER LIMB NOS (ICD-451.19)  11/27/07- 53 year old woman self-referred for allergy evaluation.  New onset of cough, 8 to 10 weeks ago.  No prior history of allergy or asthma.  She thought she had a cold.  Antihistamines help, but cough returns when they are stopped.  Throat itches.  Eyes are red.  Denies sneeze, wheeze, phlegm.  No shortness of breath.  Comfortable supine.  Much indigestion, with a hiatal hernia.Taking a "digestive cleanser". House 1983, crawl space, gas heat, denies mold, pets, smokers      Prior Medications Reviewed Using: Patient Recall  Updated Prior Medication List: HYDROCHLOROTHIAZIDE 12.5 MG TABS (HYDROCHLOROTHIAZIDE) take 1 by mouth once daily SYNTHROID 175 MCG TABS (LEVOTHYROXINE SODIUM) take 1 by mouth once daily  Current Allergies (reviewed today): No known allergies   Past Medical History:    Reviewed history from 06/13/2006 and no changes required:       Fe deficient anemia in hospital as well,        h/o  polio, age 52,        normal EKG July 06,        Pyelonephritis in Dec `04,        R leg length descrepancy 2 cm,        TSH 19.7 11/06       RLE DVT 10/07  Past Surgical History:    Reviewed history from 04/17/2006 and no changes required:       BTL - 02/18/1993   Family History:    Reviewed history from 04/17/2006 and no changes required:       Father died at 32 with Lung CA, HTN and DM as well on both sides of family, Mother died at 66 with heart dz during labor, No siblings  Social History:    Reviewed history from 04/17/2006 and no changes required:        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  No hisotry of abnormal pap or STD       Widowed    Review of Systems      See HPI       nonproductive cough, weight change, sneezing.   Physical Exam  General: A/Ox3; pleasant and cooperative, NAD, overweight AAF SKIN: no rash, lesions NODES: no lymphadenopathy HEENT: [place]/AT, EOM- WNL, Conjuctivae- clear, PERRLA, TM-WNL, Nose- clear, Throat- clear and wnl NECK: Supple w/ fair ROM, JVD- none, normal carotid  impulses w/o bruits Thyroid- normal to palpation CHEST: Clear to P&A, dry cough with deep breath HEART: RRR, no m/g/r heard ABDOMEN: Soft and nl; nml bowel sounds; no organomegalywere well known to her and and he is to do a or masses noted ZOX:WRUE, nl pulses, no edema. Heavy legs, -Homan's and a  NEURO: Grossly intact to observation       Impression & Recommendations:  Problem # 1:  COUGH (ICD-786.2) Nonspecific cough. We can look for allergic mechanism as requested, but hx suggests GERD. Plan CBC , CXR, Immu nocap, PFT. Option to skin test if appropriate later. "RAST" (Allergy Full Profile) IGE ([id number]) Pulmonary Referral (Pulmonary) "RAST" (Allergy Full Profile) IGE ([id number]) Pulmonary Referral (Pulmonary) "RAST" (Allergy Full Profile) IGE ([id number]) Pulmonary Referral (Pulmonary) "RAST"  (Allergy Full Profile) IGE ([id number]) Pulmonary Referral (Pulmonary) "RAST" (Allergy Full Profile) IGE ([id number]) Pulmonary Referral (Pulmonary) "RAST" (Allergy Full Profile) IGE ([id number]) Pulmonary Referral (Pulmonary) "RAST" (Allergy Full Profile) IGE ([id number]) Pulmonary Referral (Pulmonary) "RAST" (Allergy Full Profile) IGE ([id number]) Pulmonary Referral (Pulmonary) "RAST" (Allergy Full Profile) IGE ([id number]) Pulmonary Referral (Pulmonary) "RAST" (Allergy Full Profile) IGE ([id number]) Pulmonary Referral (Pulmonary) "RAST" (Allergy Full Profile) IGE ([id number]) Pulmonary Referral (Pulmonary) "RAST" (Allergy Full Profile) IGE ([id number]) Pulmonary Referral (Pulmonary)   Problem # 2:  VENOUS INSUFFICIENCY, CHRONIC, RIGHT LEG (ICD-459.81) Chronic heavy right leg since polio, with subsequent DVT. Watch for thrombophlebitis.  Medications Added to Medication List This Visit: 1)  Hydrochlorothiazide 12.5 Mg Tabs (Hydrochlorothiazide) .... Take 1 by mouth once daily 2)  Synthroid 175 Mcg Tabs (Levothyroxine sodium) .... Take 1 by mouth once daily   Patient Instructions: 1)  Please schedule a follow-up appointment in 1 month. 2)  We may want to do allergy skin testing on return- tell the check out person. Also, will need to be off of antihistamines like claritin and Benadryl for 3 days before you return. 3)  Lab 4)  A chest x-ray has been recommended.  Your imaging study may require preauthorization. 5)  Schedule  PFT   ]

## 2010-03-23 ENCOUNTER — Encounter (HOSPITAL_COMMUNITY): Payer: Self-pay

## 2010-03-26 ENCOUNTER — Encounter (HOSPITAL_COMMUNITY): Payer: Self-pay

## 2010-03-28 ENCOUNTER — Encounter (HOSPITAL_COMMUNITY): Payer: Self-pay

## 2010-03-30 ENCOUNTER — Encounter (HOSPITAL_COMMUNITY): Payer: Self-pay

## 2010-04-02 ENCOUNTER — Encounter (HOSPITAL_COMMUNITY): Payer: Self-pay

## 2010-04-04 ENCOUNTER — Encounter (HOSPITAL_COMMUNITY): Payer: Self-pay

## 2010-04-06 ENCOUNTER — Encounter (HOSPITAL_COMMUNITY): Payer: Self-pay

## 2010-04-09 ENCOUNTER — Encounter (HOSPITAL_COMMUNITY): Payer: Self-pay

## 2010-04-11 ENCOUNTER — Encounter (HOSPITAL_COMMUNITY): Payer: Self-pay

## 2010-04-13 ENCOUNTER — Encounter (HOSPITAL_COMMUNITY): Payer: Self-pay

## 2010-04-16 ENCOUNTER — Encounter (HOSPITAL_COMMUNITY): Payer: Self-pay

## 2010-04-18 ENCOUNTER — Encounter (HOSPITAL_COMMUNITY): Payer: Self-pay

## 2010-04-20 ENCOUNTER — Encounter (HOSPITAL_COMMUNITY): Payer: Self-pay

## 2010-04-23 ENCOUNTER — Encounter (HOSPITAL_COMMUNITY): Payer: Self-pay

## 2010-04-25 ENCOUNTER — Encounter (HOSPITAL_COMMUNITY): Payer: Self-pay

## 2010-04-27 ENCOUNTER — Encounter (HOSPITAL_COMMUNITY): Payer: Self-pay

## 2010-04-30 ENCOUNTER — Encounter (HOSPITAL_COMMUNITY): Payer: Self-pay

## 2010-04-30 LAB — GLUCOSE, CAPILLARY
Glucose-Capillary: 119 mg/dL — ABNORMAL HIGH (ref 70–99)
Glucose-Capillary: 134 mg/dL — ABNORMAL HIGH (ref 70–99)

## 2010-05-01 LAB — GLUCOSE, CAPILLARY
Glucose-Capillary: 106 mg/dL — ABNORMAL HIGH (ref 70–99)
Glucose-Capillary: 107 mg/dL — ABNORMAL HIGH (ref 70–99)
Glucose-Capillary: 112 mg/dL — ABNORMAL HIGH (ref 70–99)
Glucose-Capillary: 120 mg/dL — ABNORMAL HIGH (ref 70–99)
Glucose-Capillary: 126 mg/dL — ABNORMAL HIGH (ref 70–99)
Glucose-Capillary: 142 mg/dL — ABNORMAL HIGH (ref 70–99)

## 2010-05-02 ENCOUNTER — Encounter (HOSPITAL_COMMUNITY): Payer: Self-pay

## 2010-05-02 LAB — GLUCOSE, CAPILLARY
Glucose-Capillary: 116 mg/dL — ABNORMAL HIGH (ref 70–99)
Glucose-Capillary: 119 mg/dL — ABNORMAL HIGH (ref 70–99)
Glucose-Capillary: 119 mg/dL — ABNORMAL HIGH (ref 70–99)
Glucose-Capillary: 122 mg/dL — ABNORMAL HIGH (ref 70–99)
Glucose-Capillary: 130 mg/dL — ABNORMAL HIGH (ref 70–99)
Glucose-Capillary: 100 mg/dL — ABNORMAL HIGH (ref 70–99)
Glucose-Capillary: 108 mg/dL — ABNORMAL HIGH (ref 70–99)
Glucose-Capillary: 111 mg/dL — ABNORMAL HIGH (ref 70–99)
Glucose-Capillary: 113 mg/dL — ABNORMAL HIGH (ref 70–99)
Glucose-Capillary: 115 mg/dL — ABNORMAL HIGH (ref 70–99)
Glucose-Capillary: 121 mg/dL — ABNORMAL HIGH (ref 70–99)
Glucose-Capillary: 124 mg/dL — ABNORMAL HIGH (ref 70–99)
Glucose-Capillary: 130 mg/dL — ABNORMAL HIGH (ref 70–99)
Glucose-Capillary: 89 mg/dL (ref 70–99)
Glucose-Capillary: 90 mg/dL (ref 70–99)
Glucose-Capillary: 93 mg/dL (ref 70–99)

## 2010-05-03 LAB — BASIC METABOLIC PANEL
BUN: 16 mg/dL (ref 6–23)
BUN: 19 mg/dL (ref 6–23)
BUN: 21 mg/dL (ref 6–23)
BUN: 20 mg/dL (ref 6–23)
CO2: 25 mEq/L (ref 19–32)
CO2: 25 mEq/L (ref 19–32)
CO2: 26 mEq/L (ref 19–32)
CO2: 27 mEq/L (ref 19–32)
Calcium: 8.8 mg/dL (ref 8.4–10.5)
Calcium: 8.9 mg/dL (ref 8.4–10.5)
Calcium: 9 mg/dL (ref 8.4–10.5)
Calcium: 9.6 mg/dL (ref 8.4–10.5)
Chloride: 104 mEq/L (ref 96–112)
Chloride: 107 mEq/L (ref 96–112)
Chloride: 109 mEq/L (ref 96–112)
Chloride: 100 mEq/L (ref 96–112)
Creatinine, Ser: 1.17 mg/dL (ref 0.4–1.2)
Creatinine, Ser: 1.17 mg/dL (ref 0.4–1.2)
Creatinine, Ser: 1.22 mg/dL — ABNORMAL HIGH (ref 0.4–1.2)
Creatinine, Ser: 1.42 mg/dL — ABNORMAL HIGH (ref 0.4–1.2)
GFR calc Af Amer: 55 mL/min — ABNORMAL LOW (ref >60)
GFR calc Af Amer: 57 mL/min — ABNORMAL LOW (ref >60)
GFR calc Af Amer: 57 mL/min — ABNORMAL LOW (ref >60)
GFR calc Af Amer: 46 mL/min — ABNORMAL LOW (ref >60)
GFR calc non Af Amer: 45 mL/min — ABNORMAL LOW (ref >60)
GFR calc non Af Amer: 47 mL/min — ABNORMAL LOW (ref >60)
GFR calc non Af Amer: 47 mL/min — ABNORMAL LOW (ref >60)
GFR calc non Af Amer: 38 mL/min — ABNORMAL LOW (ref >60)
Glucose, Bld: 107 mg/dL — ABNORMAL HIGH (ref 70–99)
Glucose, Bld: 116 mg/dL — ABNORMAL HIGH (ref 70–99)
Glucose, Bld: 208 mg/dL — ABNORMAL HIGH (ref 70–99)
Glucose, Bld: 145 mg/dL — ABNORMAL HIGH (ref 70–99)
Potassium: 3.6 mEq/L (ref 3.5–5.1)
Potassium: 4.2 mEq/L (ref 3.5–5.1)
Potassium: 4.3 mEq/L (ref 3.5–5.1)
Potassium: 4.1 mEq/L (ref 3.5–5.1)
Sodium: 136 mEq/L (ref 135–145)
Sodium: 139 mEq/L (ref 135–145)
Sodium: 140 mEq/L (ref 135–145)
Sodium: 135 mEq/L (ref 135–145)

## 2010-05-03 LAB — CBC
HCT: 28.6 % — ABNORMAL LOW (ref 36.0–46.0)
HCT: 31.7 % — ABNORMAL LOW (ref 36.0–46.0)
HCT: 32 % — ABNORMAL LOW (ref 36.0–46.0)
HCT: 32.4 % — ABNORMAL LOW (ref 36.0–46.0)
HCT: 34.8 % — ABNORMAL LOW (ref 36.0–46.0)
HCT: 34.8 % — ABNORMAL LOW (ref 36.0–46.0)
HCT: 41.5 % (ref 36.0–46.0)
Hemoglobin: 10.6 g/dL — ABNORMAL LOW (ref 12.0–15.0)
Hemoglobin: 10.8 g/dL — ABNORMAL LOW (ref 12.0–15.0)
Hemoglobin: 10.9 g/dL — ABNORMAL LOW (ref 12.0–15.0)
Hemoglobin: 11.7 g/dL — ABNORMAL LOW (ref 12.0–15.0)
Hemoglobin: 12 g/dL (ref 12.0–15.0)
Hemoglobin: 9.6 g/dL — ABNORMAL LOW (ref 12.0–15.0)
Hemoglobin: 13.9 g/dL (ref 12.0–15.0)
MCH: 27.3 pg (ref 26.0–34.0)
MCH: 27.3 pg (ref 26.0–34.0)
MCH: 27.4 pg (ref 26.0–34.0)
MCH: 27.5 pg (ref 26.0–34.0)
MCH: 27.7 pg (ref 26.0–34.0)
MCH: 27.7 pg (ref 26.0–34.0)
MCH: 28.8 pg (ref 26.0–34.0)
MCHC: 33.4 g/dL (ref 30.0–36.0)
MCHC: 33.6 g/dL (ref 30.0–36.0)
MCHC: 33.6 g/dL (ref 30.0–36.0)
MCHC: 33.6 g/dL (ref 30.0–36.0)
MCHC: 33.8 g/dL (ref 30.0–36.0)
MCHC: 34.5 g/dL (ref 30.0–36.0)
MCHC: 33.5 g/dL (ref 30.0–36.0)
MCV: 80.4 fL (ref 78.0–100.0)
MCV: 81.3 fL (ref 78.0–100.0)
MCV: 81.4 fL (ref 78.0–100.0)
MCV: 81.5 fL (ref 78.0–100.0)
MCV: 81.7 fL (ref 78.0–100.0)
MCV: 82.2 fL (ref 78.0–100.0)
MCV: 86.2 fL (ref 78.0–100.0)
Platelets: 281 10*3/uL (ref 150–400)
Platelets: 285 10*3/uL (ref 150–400)
Platelets: 309 10*3/uL (ref 150–400)
Platelets: 311 10*3/uL (ref 150–400)
Platelets: 317 10*3/uL (ref 150–400)
Platelets: 322 10*3/uL (ref 150–400)
Platelets: 344 10*3/uL (ref 150–400)
RBC: 3.52 MIL/uL — ABNORMAL LOW (ref 3.87–5.11)
RBC: 3.88 MIL/uL (ref 3.87–5.11)
RBC: 3.93 MIL/uL (ref 3.87–5.11)
RBC: 3.94 MIL/uL (ref 3.87–5.11)
RBC: 4.27 MIL/uL (ref 3.87–5.11)
RBC: 4.33 MIL/uL (ref 3.87–5.11)
RBC: 4.82 MIL/uL (ref 3.87–5.11)
RDW: 14.8 % (ref 11.5–15.5)
RDW: 14.9 % (ref 11.5–15.5)
RDW: 14.9 % (ref 11.5–15.5)
RDW: 15 % (ref 11.5–15.5)
RDW: 15.1 % (ref 11.5–15.5)
RDW: 15.3 % (ref 11.5–15.5)
RDW: 15.1 % (ref 11.5–15.5)
WBC: 6.6 10*3/uL (ref 4.0–10.5)
WBC: 6.9 10*3/uL (ref 4.0–10.5)
WBC: 7.6 10*3/uL (ref 4.0–10.5)
WBC: 7.6 10*3/uL (ref 4.0–10.5)
WBC: 8 10*3/uL (ref 4.0–10.5)
WBC: 8.3 10*3/uL (ref 4.0–10.5)
WBC: 6.9 10*3/uL (ref 4.0–10.5)

## 2010-05-03 LAB — CARDIAC PANEL(CRET KIN+CKTOT+MB+TROPI)
CK, MB: 11.3 ng/mL (ref 0.3–4.0)
CK, MB: 83.5 ng/mL (ref 0.3–4.0)
CK, MB: 84.7 ng/mL (ref 0.3–4.0)
CK, MB: 85.8 ng/mL (ref 0.3–4.0)
Relative Index: 16.4 — ABNORMAL HIGH (ref 0.0–2.5)
Relative Index: 16.8 — ABNORMAL HIGH (ref 0.0–2.5)
Relative Index: 17.1 — ABNORMAL HIGH (ref 0.0–2.5)
Relative Index: 6.7 — ABNORMAL HIGH (ref 0.0–2.5)
Total CK: 169 U/L (ref 7–177)
Total CK: 496 U/L — ABNORMAL HIGH (ref 7–177)
Total CK: 510 U/L — ABNORMAL HIGH (ref 7–177)
Total CK: 510 U/L — ABNORMAL HIGH (ref 7–177)
Troponin I: 1.11 ng/mL (ref 0.00–0.06)
Troponin I: 11.26 ng/mL (ref 0.00–0.06)
Troponin I: 11.56 ng/mL (ref 0.00–0.06)
Troponin I: 12.68 ng/mL (ref 0.00–0.06)

## 2010-05-03 LAB — GLUCOSE, CAPILLARY
Glucose-Capillary: 101 mg/dL — ABNORMAL HIGH (ref 70–99)
Glucose-Capillary: 102 mg/dL — ABNORMAL HIGH (ref 70–99)
Glucose-Capillary: 104 mg/dL — ABNORMAL HIGH (ref 70–99)
Glucose-Capillary: 106 mg/dL — ABNORMAL HIGH (ref 70–99)
Glucose-Capillary: 107 mg/dL — ABNORMAL HIGH (ref 70–99)
Glucose-Capillary: 108 mg/dL — ABNORMAL HIGH (ref 70–99)
Glucose-Capillary: 109 mg/dL — ABNORMAL HIGH (ref 70–99)
Glucose-Capillary: 109 mg/dL — ABNORMAL HIGH (ref 70–99)
Glucose-Capillary: 110 mg/dL — ABNORMAL HIGH (ref 70–99)
Glucose-Capillary: 112 mg/dL — ABNORMAL HIGH (ref 70–99)
Glucose-Capillary: 113 mg/dL — ABNORMAL HIGH (ref 70–99)
Glucose-Capillary: 114 mg/dL — ABNORMAL HIGH (ref 70–99)
Glucose-Capillary: 116 mg/dL — ABNORMAL HIGH (ref 70–99)
Glucose-Capillary: 116 mg/dL — ABNORMAL HIGH (ref 70–99)
Glucose-Capillary: 117 mg/dL — ABNORMAL HIGH (ref 70–99)
Glucose-Capillary: 117 mg/dL — ABNORMAL HIGH (ref 70–99)
Glucose-Capillary: 120 mg/dL — ABNORMAL HIGH (ref 70–99)
Glucose-Capillary: 128 mg/dL — ABNORMAL HIGH (ref 70–99)
Glucose-Capillary: 129 mg/dL — ABNORMAL HIGH (ref 70–99)
Glucose-Capillary: 129 mg/dL — ABNORMAL HIGH (ref 70–99)
Glucose-Capillary: 129 mg/dL — ABNORMAL HIGH (ref 70–99)
Glucose-Capillary: 133 mg/dL — ABNORMAL HIGH (ref 70–99)
Glucose-Capillary: 144 mg/dL — ABNORMAL HIGH (ref 70–99)
Glucose-Capillary: 144 mg/dL — ABNORMAL HIGH (ref 70–99)
Glucose-Capillary: 147 mg/dL — ABNORMAL HIGH (ref 70–99)
Glucose-Capillary: 162 mg/dL — ABNORMAL HIGH (ref 70–99)
Glucose-Capillary: 204 mg/dL — ABNORMAL HIGH (ref 70–99)
Glucose-Capillary: 215 mg/dL — ABNORMAL HIGH (ref 70–99)

## 2010-05-03 LAB — LIPID PANEL
Cholesterol: 187 mg/dL (ref 0–200)
HDL: 50 mg/dL (ref >39)
LDL Cholesterol: 122 mg/dL — ABNORMAL HIGH (ref 0–99)
Total CHOL/HDL Ratio: 3.7 RATIO
Triglycerides: 76 mg/dL (ref <150)
VLDL: 15 mg/dL (ref 0–40)

## 2010-05-03 LAB — POCT CARDIAC MARKERS
CKMB, poc: 48.7 ng/mL (ref 1.0–8.0)
CKMB, poc: 64.4 ng/mL (ref 1.0–8.0)
Myoglobin, poc: 208 ng/mL (ref 12–200)
Myoglobin, poc: 224 ng/mL (ref 12–200)
Troponin i, poc: 11.7 ng/mL (ref 0.00–0.09)
Troponin i, poc: 9.22 ng/mL (ref 0.00–0.09)

## 2010-05-03 LAB — PROTIME-INR
INR: 0.94 (ref 0.00–1.49)
INR: 0.98 (ref 0.00–1.49)
INR: 1.01 (ref 0.00–1.49)
INR: 1.1 (ref 0.00–1.49)
INR: 0.92 (ref 0.00–1.49)
Prothrombin Time: 12.8 seconds (ref 11.6–15.2)
Prothrombin Time: 13.2 seconds (ref 11.6–15.2)
Prothrombin Time: 13.5 seconds (ref 11.6–15.2)
Prothrombin Time: 14.4 seconds (ref 11.6–15.2)
Prothrombin Time: 12.6 seconds (ref 11.6–15.2)

## 2010-05-03 LAB — DIFFERENTIAL
Basophils Absolute: 0 10*3/uL (ref 0.0–0.1)
Basophils Relative: 0 % (ref 0–1)
Eosinophils Absolute: 0.1 10*3/uL (ref 0.0–0.7)
Eosinophils Relative: 2 % (ref 0–5)
Lymphocytes Relative: 18 % (ref 12–46)
Lymphs Abs: 1.3 10*3/uL (ref 0.7–4.0)
Monocytes Absolute: 0.8 10*3/uL (ref 0.1–1.0)
Monocytes Relative: 11 % (ref 3–12)
Neutro Abs: 4.8 10*3/uL (ref 1.7–7.7)
Neutrophils Relative %: 69 % (ref 43–77)

## 2010-05-03 LAB — HEMOGLOBIN A1C
Hgb A1c MFr Bld: 6.8 % — ABNORMAL HIGH (ref <5.7)
Mean Plasma Glucose: 148 mg/dL — ABNORMAL HIGH (ref <117)

## 2010-05-03 LAB — MAGNESIUM: Magnesium: 2.2 mg/dL (ref 1.5–2.5)

## 2010-05-03 LAB — TSH: TSH: 2.05 u[IU]/mL (ref 0.350–4.500)

## 2010-05-03 LAB — HEPARIN LEVEL (UNFRACTIONATED)
Heparin Unfractionated: 0.46 IU/mL (ref 0.30–0.70)
Heparin Unfractionated: 0.54 IU/mL (ref 0.30–0.70)
Heparin Unfractionated: 0.54 IU/mL (ref 0.30–0.70)
Heparin Unfractionated: 0.55 IU/mL (ref 0.30–0.70)

## 2010-05-03 LAB — D-DIMER, QUANTITATIVE
D-Dimer, Quant: 0.46 ug/mL-FEU (ref 0.00–0.48)
D-Dimer, Quant: 0.25 ug/mL-FEU (ref 0.00–0.48)

## 2010-05-03 LAB — SEDIMENTATION RATE: Sed Rate: 44 mm/hr — ABNORMAL HIGH (ref 0–22)

## 2010-05-03 LAB — MRSA PCR SCREENING: MRSA by PCR: NEGATIVE

## 2010-05-04 ENCOUNTER — Encounter (HOSPITAL_COMMUNITY): Payer: Self-pay

## 2010-05-07 ENCOUNTER — Encounter (HOSPITAL_COMMUNITY): Payer: Self-pay

## 2010-05-09 ENCOUNTER — Encounter (HOSPITAL_COMMUNITY): Payer: Self-pay

## 2010-05-11 ENCOUNTER — Encounter (HOSPITAL_COMMUNITY): Payer: Self-pay

## 2010-05-14 ENCOUNTER — Encounter (HOSPITAL_COMMUNITY): Payer: Self-pay

## 2010-05-16 ENCOUNTER — Encounter (HOSPITAL_COMMUNITY): Payer: Self-pay

## 2010-05-18 ENCOUNTER — Encounter (HOSPITAL_COMMUNITY): Payer: Self-pay

## 2010-05-21 ENCOUNTER — Encounter (HOSPITAL_COMMUNITY): Payer: Self-pay

## 2010-05-23 ENCOUNTER — Encounter (HOSPITAL_COMMUNITY): Payer: Self-pay

## 2010-05-25 ENCOUNTER — Encounter (HOSPITAL_COMMUNITY): Payer: Self-pay

## 2010-05-28 ENCOUNTER — Encounter (HOSPITAL_COMMUNITY): Payer: Self-pay

## 2010-05-28 LAB — POCT I-STAT, CHEM 8
BUN: 15 mg/dL (ref 6–23)
Calcium, Ion: 1.2 mmol/L (ref 1.12–1.32)
Chloride: 102 mEq/L (ref 96–112)
Creatinine, Ser: 1.2 mg/dL (ref 0.4–1.2)
Glucose, Bld: 139 mg/dL — ABNORMAL HIGH (ref 70–99)
HCT: 42 % (ref 36.0–46.0)
Hemoglobin: 14.3 g/dL (ref 12.0–15.0)
Potassium: 3.7 mEq/L (ref 3.5–5.1)
Sodium: 138 mEq/L (ref 135–145)
TCO2: 27 mmol/L (ref 0–100)

## 2010-05-28 LAB — COMPREHENSIVE METABOLIC PANEL
ALT: 18 U/L (ref 0–35)
AST: 28 U/L (ref 0–37)
Albumin: 3.1 g/dL — ABNORMAL LOW (ref 3.5–5.2)
Alkaline Phosphatase: 61 U/L (ref 39–117)
BUN: 13 mg/dL (ref 6–23)
CO2: 26 mEq/L (ref 19–32)
Calcium: 9.2 mg/dL (ref 8.4–10.5)
Chloride: 107 mEq/L (ref 96–112)
Creatinine, Ser: 1.1 mg/dL (ref 0.4–1.2)
GFR calc Af Amer: >60 mL/min (ref >60)
GFR calc non Af Amer: 51 mL/min — ABNORMAL LOW (ref >60)
Glucose, Bld: 113 mg/dL — ABNORMAL HIGH (ref 70–99)
Potassium: 3.9 mEq/L (ref 3.5–5.1)
Sodium: 139 mEq/L (ref 135–145)
Total Bilirubin: 0.6 mg/dL (ref 0.3–1.2)
Total Protein: 6.6 g/dL (ref 6.0–8.3)

## 2010-05-28 LAB — CBC
HCT: 33.4 % — ABNORMAL LOW (ref 36.0–46.0)
Hemoglobin: 11.4 g/dL — ABNORMAL LOW (ref 12.0–15.0)
MCHC: 33.7 g/dL (ref 30.0–36.0)
MCV: 83.6 fL (ref 78.0–100.0)
Platelets: 270 10*3/uL (ref 150–400)
RBC: 3.99 MIL/uL (ref 3.87–5.11)
RDW: 14.4 % (ref 11.5–15.5)
WBC: 7.7 10*3/uL (ref 4.0–10.5)

## 2010-05-28 LAB — APTT: aPTT: 40 seconds — ABNORMAL HIGH (ref 24–37)

## 2010-05-28 LAB — ANTITHROMBIN III: AntiThromb III Func: 102 % (ref 75–120)

## 2010-05-28 LAB — PROTEIN C ACTIVITY: Protein C Activity: 200 % — ABNORMAL HIGH (ref 75–133)

## 2010-05-28 LAB — PROTIME-INR
INR: 0.9 (ref 0.00–1.49)
INR: 1 (ref 0.00–1.49)
Prothrombin Time: 12.2 seconds (ref 11.6–15.2)
Prothrombin Time: 13.4 seconds (ref 11.6–15.2)

## 2010-05-28 LAB — CARDIOLIPIN ANTIBODIES, IGG, IGM, IGA
Anticardiolipin IgA: 12 [APL'U] — ABNORMAL LOW (ref <13)
Anticardiolipin IgG: <7 [GPL'U] — ABNORMAL LOW (ref <11)
Anticardiolipin IgM: <7 [MPL'U] — ABNORMAL LOW (ref <10)

## 2010-05-28 LAB — PROTEIN S, TOTAL: Protein S Ag, Total: 120 % (ref 70–140)

## 2010-05-28 LAB — POCT CARDIAC MARKERS
CKMB, poc: 1.6 ng/mL (ref 1.0–8.0)
Myoglobin, poc: 323 ng/mL (ref 12–200)
Troponin i, poc: <0.05 ng/mL (ref 0.00–0.09)

## 2010-05-28 LAB — PROTEIN S ACTIVITY: Protein S Activity: 157 % — ABNORMAL HIGH (ref 69–129)

## 2010-05-28 LAB — HOMOCYSTEINE: Homocysteine: 8.2 umol/L (ref 4.0–15.4)

## 2010-05-28 LAB — PROTEIN C, TOTAL: Protein C, Total: 104 % (ref 70–140)

## 2010-05-28 LAB — FACTOR 5 LEIDEN

## 2010-05-28 LAB — TSH: TSH: 0.104 u[IU]/mL — ABNORMAL LOW (ref 0.350–4.500)

## 2010-05-28 LAB — BETA 2 MICROGLOBULIN, SERUM: Beta-2 Microglobulin: 2.03 mg/L — ABNORMAL HIGH (ref 1.01–1.73)

## 2010-05-29 LAB — CBC
HCT: 37.6 % (ref 36.0–46.0)
Hemoglobin: 12.9 g/dL (ref 12.0–15.0)
MCHC: 34.1 g/dL (ref 30.0–36.0)
MCV: 84.1 fL (ref 78.0–100.0)
Platelets: 289 10*3/uL (ref 150–400)
RBC: 4.47 MIL/uL (ref 3.87–5.11)
RDW: 14.6 % (ref 11.5–15.5)
WBC: 5.8 10*3/uL (ref 4.0–10.5)

## 2010-05-29 LAB — DIFFERENTIAL
Basophils Absolute: 0 10*3/uL (ref 0.0–0.1)
Basophils Relative: 1 % (ref 0–1)
Eosinophils Absolute: 0.2 10*3/uL (ref 0.0–0.7)
Eosinophils Relative: 3 % (ref 0–5)
Lymphocytes Relative: 39 % (ref 12–46)
Lymphs Abs: 2.3 10*3/uL (ref 0.7–4.0)
Monocytes Absolute: 0.6 10*3/uL (ref 0.1–1.0)
Monocytes Relative: 10 % (ref 3–12)
Neutro Abs: 2.8 10*3/uL (ref 1.7–7.7)
Neutrophils Relative %: 48 % (ref 43–77)

## 2010-05-30 ENCOUNTER — Encounter (HOSPITAL_COMMUNITY): Payer: Self-pay

## 2010-05-30 LAB — APTT: aPTT: 30 seconds (ref 24–37)

## 2010-05-30 LAB — PROTIME-INR
INR: 0.9 (ref 0.00–1.49)
Prothrombin Time: 12.7 seconds (ref 11.6–15.2)

## 2010-06-01 ENCOUNTER — Encounter (HOSPITAL_COMMUNITY): Payer: Self-pay

## 2010-06-04 ENCOUNTER — Encounter (HOSPITAL_COMMUNITY): Payer: Self-pay

## 2010-06-06 ENCOUNTER — Encounter (HOSPITAL_COMMUNITY): Payer: Self-pay

## 2010-06-08 ENCOUNTER — Encounter (HOSPITAL_COMMUNITY): Payer: Self-pay

## 2010-06-11 ENCOUNTER — Encounter (HOSPITAL_COMMUNITY): Payer: Self-pay

## 2010-07-03 NOTE — Discharge Summary (Signed)
[name], [name]              ACCOUNT NO.:  [id number]   MEDICAL RECORD NO.:  [id number]          PATIENT TYPE:  INP   LOCATION:  1332                         FACILITY:  [facility]   PHYSICIAN:  Renee Ramus, MD       DATE OF BIRTH:  [birth year]   DATE OF ADMISSION:  07/27/2008  DATE OF DISCHARGE:  07/28/2008                               DISCHARGE SUMMARY   PRIMARY DISCHARGE DIAGNOSIS:  Deep vein thrombosis.   SECONDARY DIAGNOSES:  1. Hypothyroid.  2. Hypertension.  3. Chronic lower back pain.   HOSPITAL COURSE BY PROBLEM:  1. Deep venous thrombosis.  The patient is a 60 year old female who      was admitted secondary to an outpatient lower extremity venous      Doppler that demonstrated right thigh deep vein thrombosis.  The      patient had previous history of right lower extremity deep vein      thrombosis2007.  The patient had 6 months of Coumadin therapy and      now has developed an additional deep vein thrombosis.  The patient      reports 3 to 4 weeks prior to admission, noting increasing right      lower extremity swelling along with bilateral leg pain.  The      patient will be placed back on Coumadin and Arixtra.  She will      follow up with primary care physician on Monday for PT/INR check.      She is well familiar with this regimen.  She is able to give      herself injections and she has a deep vein thrombosis workup that      is currently pending.  2. Chronic back pain.  The patient will be given a prescription for      Vicodin.  She is seeing a [facility] for this.  She says from      bulging disk and pinched nerve pain.  3. Hypothyroid.  The patient's TSH is somewhat suppressed and we have      advised her to follow up with primary care physician regarding any      medication dose changes regarding her thyroid.  4. Hypertensions.  This has been relatively well controlled throughout      this admission.  She will be continued on hydrochlorothiazide  postdischarge.   LABS OF NOTE:  1. Mild anemia with hemoglobin of 11.4, hematocrit 33.4.  2. INR of 1.0.  3. Elevated protein C at 200 and protein S at 157.  4. Mild increased blood glucose at 113.  5. Beta-2 microglobulin was elevated at 2.03.  6. TSH somewhat depressed at 0.104 with no free T4.   STUDIES:  1. Ultrasound of the lower extremity showing right mid distal      superficial femoral nonocclusive DVT, likely subacute.  There is no      previous ultrasound to compare to.  2. Chest x-ray showing no active lung disease.   MEDICATIONS ON DISCHARGE:  1. Synthroid 150 mcg p.o. daily.  2. Hydrochlorothiazide 25 mg p.o. daily.  3.  Potassium supplements not specified.  4. Probiotic not specified.  5. Biotin 1 tablet daily.  6. Focus smart 1 tablet daily.  7. Colest-Off 2 tablets p.o. b.i.d.  8. Arixtra 7.5 mg subcu daily x4 days.  9. Vicodin 5/500 one to two p.o. q.6 hours p.r.n. pain.  10.Coumadin 7.5 mg p.o. daily.   FOLLOW UP:  The patient is instructed to follow up with primary care  physician on Monday for PT/INR check and adjustment of Coumadin  medication.  If she completes 4 days of subcu Arixtra she will 5 days  total of co-treatment with heparin as well as Coumadin.  There are no  labs or studies pending at time of discharge.  The patient is in stable  condition and anxious for discharge.   Time spent, 35 minutes.      Renee Ramus, MD  Electronically Signed     JF/MEDQ  D:  07/28/2008  T:  07/28/2008  Job:  [id number]   cc:   Merlene Laughter. Renae Gloss, M.D.  Fax: [phone]

## 2010-07-03 NOTE — H&P (Signed)
[name], [name]              ACCOUNT NO.:  [id number]   MEDICAL RECORD NO.:  [id number]          PATIENT TYPE:  INP   LOCATION:  1332                         FACILITY:  [facility]   PHYSICIAN:  Sarajane Marek, MD     DATE OF BIRTH:  Jul 07, 1950   DATE OF ADMISSION:  07/27/2008  DATE OF DISCHARGE:                              HISTORY & PHYSICAL   DIAGNOSIS:  Deep vein thrombosis.   HISTORY OF PRESENT ILLNESS:  [name] is a 60 year old African  American female with a past medical history significant for right lower  extremity DVT in 2007, status post 6 months of Coumadin therapy who  returns today after outpatient lower extremity venous Doppler  demonstrated right thigh DVT.  The patient reports that she has had 3-4  weeks of increasing right lower extremity swelling along with bilateral  leg pain focused in the right lower extremity as well as in the left  hip.  She associated initially this pain with her history of herniated  lumbar disk, however, noticed that the right lower extremity swelling  was somewhat increased yesterday and therefore became concerned given  her history of prior DVT and proceeded to her primary care physician's  office today.  At that time an outpatient venous Doppler was obtained  of the right lower extremity only, per the patient, which did  demonstrate a right thigh deep venous thrombosis.  The patient reports  she has not had any recent surgeries.  There has  not been any recent  injury or immobilization.  Her prior DVT was associated with a fall with  right hip injury in 2007.  She does not take any estrogen supplements  and does not smoke cigarettes.  She reports she does have a cousin who  has had multiple thrombotic events.  Her mother is deceased at age 27  and it is unknown if she had any history of thrombotic events.  The  patient does not have any history of miscarriage.  Otherwise she reports  that she has been doing well with the exception of  a mild cough which  she attributes to allergies, but specifically denies chest pain.  She  has noticed some very mild increased shortness of breath over the last  several days but reports at this time that she feels fine.   REVIEW OF SYSTEMS:  A complete 14-point review of systems was performed  and was negative except as per HPI.   PAST MEDICAL HISTORY:  1. Right lower extremity DVT,  status post Coumadin therapy x6 months      in 2007 associated with a mechanical fall and right hip injury.  2. Hypothyroid.  3. Hypertension.  4. History of bleeding hemorrhoid.  5. Remote history of polio.  6. Status post bilateral tubal ligation.   CURRENT MEDICATIONS:  1. Synthroid 150 mcg p.o. daily.  2. Hydrochlorothiazide 25 mg p.o. daily.   ALLERGIES:  No known drug allergies.   FAMILY HISTORY:  Her mother is deceased age 59 secondary to heart  failure complications during labor.  Her father is deceased age 60  secondary to  lung cancer.  She does not have any siblings.   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  She does not have any history of alcohol,  tobacco or illicit drug use.  She uses multiple herbal medications  including potassium, probiotics, Biotin, Focus Smart and Cholest-Off.   PHYSICAL EXAM:  VITAL SIGNS:  Her temperature is 98.0, blood pressure is  139/77, pulse is 94, respiratory rate is 20 and oxygen saturation is  100% on room air.  GENERAL:  This is a very pleasant 60 year old African American female in  no acute distress.  EYES:  Extraocular muscles intact.  Sclerae are clear.  ENT:  Oropharynx is clear.  Nares are clear.  Mucous membranes are  moist.  She does have a metal plate along the back side of her upper  teeth.  NECK:  Supple without lymphadenopathy.  There is no thyromegaly, bruit  or JVD.  CARDIOVASCULAR:  Heart rate is regular with normal S1 and S2.  No S3 or  S4.  No murmur, gallop or rub.   There are 2+ pulses x4 extremities.  RESPIRATORY:  Lungs are clear to auscultation bilaterally without  increased work of breathing.  ABDOMEN:  Soft and nontender with normoactive bowel sounds.  No rebound,  guarding or mass.  EXTREMITIES:  She does have trace left lower extremity edema and 1+  right lower extremity edema with visibly greater edema on the right than  left.  There is no tenderness to palpation of the right calf.  NEURO:  Alert and oriented x3 with no focal deficits.  PSYCH:  Normal judgment and insight.   Ultrasound venous Doppler right mid and distal superficial femoral  nonocclusive DVT, may be subacute and it has already partially  recanalized.   LABORATORY DATA:  Troponin is less than 0.05.  INR is 0.9.  Sodium 138,  potassium 3.7, chloride 102, bicarb 27, BUN 15, creatinine 1.2.  Hemoglobin is 14.3, hematocrit is 42, platelets are pending at this  time.   ASSESSMENT/PLAN:  [name] is a 60 year old African American female  with right lower extremity deep vein thrombosis.  1. Deep vein thrombosis:  The patient will be admitted for initiation      of Lovenox therapy as well as initiation of Coumadin.  I will      obtain a 12-lead electrocardiogram as well as a transthoracic      echocardiogram given mild shortness of breath over the weekend.  In      addition, since this is her second deep vein thrombosis with no      obvious inciting event, we .  will send a full panel of      hypercoagulable labs including homocysteine, protein C, protein S,      antithrombin III, beta 2 anticardiolipin antibodies, APLA and a      Factor V Leiden.  We will initiate Coumadin per pharmacy protocol.  2. Hypertension:  Well controlled at this time.  Continue      hydrochlorothiazide.  Electrolytes are within normal limits.  3. Hypothyroid:  Continue Synthroid at home dose and check thyroid-      stimulating hormone.  4. Prophylaxis:  The patient will be on Lovenox for full       anticoagulation.  Will initiate p.o. Protonix once daily while      hospitalized.   This patient is a full code.      Sarajane Marek, MD  Electronically  Signed     HH/MEDQ  D:  07/27/2008  T:  07/28/2008  Job:  [id number]   cc:   Merlene Laughter. Renae Gloss, M.D.  Fax: [phone]

## 2010-07-06 NOTE — Discharge Summary (Signed)
[name], [name]              ACCOUNT NO.:  [id number]   MEDICAL RECORD NO.:  [id number]          PATIENT TYPE:  INP   LOCATION:  3034                         FACILITY:  [facility]   PHYSICIAN:  Ruthe Mannan, M.D.       DATE OF BIRTH:  07/26/1950   DATE OF ADMISSION:  11/28/2005  DATE OF DISCHARGE:  12/01/2005                                 DISCHARGE SUMMARY   DATE OF ADMISSION:  October 11. 2007   DATE OF DISCHARGE:  December 01, 2005   REASON FOR ADMISSION:  Treatment of deep vein thrombosis.   DISCHARGE DIAGNOSES:  1. Right lower extremity deep vein thrombosis.  2. Hypothyroidism.  3. Hypertension.   DISCHARGE MEDICATIONS:  1. Hydrochlorothiazide 12.5 mg daily.  2. Aspirin 81 mg daily.  3. Synthroid 150 mcg daily.  4. Coumadin 7.5 mg daily.   DISPOSITION:  Patient will be discharged home with Coumadin and Lovenox.  She is to follow up within our clinic to get her followup INRs on Monday,  December 02, 2005.  She received Coumadin and Lovenox teaching while in the  inpatient service.  Discharge followup with Dr.  Gilmore Laroche on October 24  at 9:30 a.m.  Prior to that, she is to go into our clinic on December 02, 2005, for INR.   FOLLOWUP ISSUES:  Her INR is currently 1.  She is sub therapeutic.   DISCHARGE CONDITION:  Stable.   DISCHARGE LABS:  INR equals 1.  Hemoglobin of 12.5, hematocrit of 36.8,  white count 7.5, creatinine of 1.3.   HOSPITAL COURSE:  1. Right lower extremity DVT.  [name] had fallen on her right hip a      week and a half ago and had progressive swelling and pain in her right      leg.  She then presented to Dr. Caren Macadam clinic on Thursday, October      11.  Lower extremity Doppler was done and found a DVT in the mid      femoral vein/popliteal vein.  She was then sent over to the hospital      and was admitted for treatment of her lower extremity DVT.  We      initially started her on Lovenox and Coumadin 10 mg daily.  We were      hoping that  her INR would increase, but it was only 1 by the third day      of admission.  We, therefore, decided to give her some Coumadin/Lovenox      teaching and treat her as an outpatient.  Her leg pain and swelling had      resolved by the second day of admission and she was completely stable.  2. Her hypothyroidism.  We just continued her home regimen of Synthroid.      She had no problems during her      hospitalization.  3. Her high blood pressure.  We just continued her on her home regimen of      hydrochlorothiazide 12.5 mg daily and it was well-controlled throughout  her hospitalization.           ______________________________  Ruthe Mannan, M.D.     TA/MEDQ  D:  12/01/2005  T:  12/02/2005  Job:  [id number]   cc:   Gilmore Laroche, M.D.

## 2010-07-06 NOTE — H&P (Signed)
Savage, Holly              ACCOUNT NO.:  [id number]   MEDICAL RECORD NO.:  [id number]          PATIENT TYPE:  INP   LOCATION:  3034                         FACILITY:  [facility]   PHYSICIAN:  Broadus John T. Pickard II, MDDATE OF BIRTH:  Mar 25, 1950   DATE OF ADMISSION:  11/28/2005  DATE OF DISCHARGE:  07/31/2005                                HISTORY & PHYSICAL   CHIEF COMPLAINT:  Pain in the right leg.   SUBJECTIVE:  Patient is a 60 year old African American female, who fell on  her right hip approximately 1 and 1/2 weeks ago.  She landed on her greater  trochanter.  Since that time she has noticed progressive swelling in the  right leg.  She also reports achy pain in the right calf and in the right  thigh.  She came to clinic today with these complaints.  In clinic her right  calf at 10 cm distal to the inferior pole of the patella was 45 cm in  diameter.  The left calf 10 cm inferior to the inferior pole of the patella  was 40 cm.   REVIEW OF SYSTEMS:  Otherwise negative.   PAST MEDICAL HISTORY:  1. Hypertension.  2. Hypothyroidism.  3. History of pyelonephritis in December of 2004 was admitted to the      hospital for that.  4. History of iron-deficient anemia in the hospital.  5. She has a history of bleeding hemorrhoids and is followed by Dr.      Bosie Clos with GI.   MEDICATIONS:  1. Aspirin 81 mg p.o. q. day.  2. Hydrochlorothiazide 12.5 mg p.o. q. day.  3. Synthroid 150 mcg p.o. q. day.   ALLERGIES:  No known drug allergies.   PAST SURGICAL HISTORY:  BTL in 1995.   FAMILY HISTORY:  1. Mother died at 54 from heart disease during labor.  2. Father died at 43 with lung cancer.  3. Hypertension and diabetes in both sides of the family with no siblings.   SOCIAL HISTORY:  She lives with her husband.  She is employed as an  Environmental health practitioner with [employer], she also works  part time with [employer].  She has no history of abnormal Pap or STD.   PHYSICAL EXAM:  VITAL SIGNS:  Are noted.  GENERAL:  No apparent distress, alert and oriented x3.  HEENT:  Atraumatic, normocephalic.  Pupils equal, round, and reactive to  light.  Extraocular movements are intact.  TMs and canals are clear.  On  examination there is no erythema or exudate in the posterior oropharynx.  LUNGS:  Clear to auscultation bilaterally, no wheezes, crackles, or rales.  CARDIOVASCULAR:  Regular rate and rhythm, no murmurs, rubs, or gallops.  ABDOMEN:  Soft, nontender, nondistended, positive bowel sounds.  EXTREMITY EXAM:  As dictated in the H&P at 10 cm distal to the inferior pole  of the patella, the calves are 45 cm in circumference on the right with a  positive Homans sign and trace edema, and 40 cm in circumference in the left  with no edema and no pain.  She has  full internal and external rotation of  her right hip without pain and full flexion and extension of the hip without  pain.   STUDIES:  A right lower extremity Doppler ultrasound shows a DVT in the mid  femoral vein and popliteal vein.   ASSESSMENT AND PLAN:  1. Right lower extremity deep vein thrombosis, I think its cause status      post her fall and trauma.  We will admit.  We will start the patient on      Lovenox until Coumadin is therapeutic.  We will get a social work      consult to arrange outpatient Lovenox therapy.  If patient elects the      trial about oral __________  antagonist we will switch from Lovenox to      trial protocol.  She is determining whether or not she wants to start      the trial now.  At present however, she will be on Lovenox 1 mg/kg Sub-      Q b.i.d. and we will give her a dose of Coumadin 10 mg p.o. x1 in the      morning and then arrange her to be followed at our Coumadin Clinic if      she declines the trial.  2. FEN.  We will start her on a regular diet, keep her on IV fluids of      normal saline and check a BNP.  3. Heme, we will check a CBC and guaiac her  stools.  4. Prophylaxis, we will start her on Protonix.  5. Activity, we will ad lib.  6. Hypothyroidism, we will start her Synthroid at its present dose.  7. Hypertension, we will resume her hydrochlorothiazide.      Broadus John T. Pamalee Leyden, MD     WTP/MEDQ  D:  11/28/2005  T:  11/29/2005  Job:  [id number]

## 2010-07-06 NOTE — Procedures (Signed)
[facility]  Patient:    [name], [name] Visit Number: [id number] MRN: [id number]          Service Type: END Location: ENDO Attending Physician:  Sabino Gasser Dictated by:   Sabino Gasser, M.D. Proc. Date: 04/20/01 Admit Date:  04/20/2001                             Procedure Report  PROCEDURE:  Colonoscopy.  INDICATION FOR PROCEDURE:  Rectal bleeding.  ANESTHESIA:  Demerol 30, Versed 3 mg.  DESCRIPTION OF PROCEDURE:  With the patient mildly sedated in the left lateral decubitus position, the Olympus videoscopic colonoscope was inserted in the rectum and passed under direct vision to the cecum identified by the ileocecal valve and appendiceal orifice both of which were photographed. From this point, the colonoscope was slowly withdrawn taking circumferential views of the entire colonic mucosa, stopping in the rectum which appeared normal in direct and in retroflexed view. The endoscope was then straightened and withdrawn through the anal canal where external hemorrhoids were seen and photographed. The endoscope was withdrawn. The patients vital signs and pulse oximeter remained stable. The patient tolerated the procedure well without apparent complications.  FINDINGS:  Hemorrhoids otherwise unremarkable examination.  PLAN:  See endoscopy note. Dictated by:   Sabino Gasser, M.D. Attending Physician:  Sabino Gasser DD:  04/20/01 TD:  04/20/01 Job: [id number]

## 2010-07-06 NOTE — Discharge Summary (Signed)
[name], [name]                        ACCOUNT NO.:  [id number]   MEDICAL RECORD NO.:  [id number]                   PATIENT TYPE:  INP   LOCATION:  5704                                 FACILITY:  [facility]   PHYSICIAN:  Franchot Mimes, MD                   DATE OF BIRTH:  05/18/1950   DATE OF ADMISSION:  01/26/2003  DATE OF DISCHARGE:  02/03/2003                                 DISCHARGE SUMMARY   DISCHARGE DIAGNOSES:  1. Pyelonephritis.  2. Pneumonia.  3. Hyperthyroidism.  4. Anemia.   PROCEDURES:  1. Renal ultrasound performed on January 28, 2003.  Results:  Symmetric     kidneys with bilateral pyelectasis but not definite caliectasis.  Cannot     rule out mild obstructive uropathy.  Bilateral edematous renal cortex     with a questionable pyelonephritis.  2. Abdominal ultrasound on January 31, 2003:  Results:  Normal gallbladder.     Normal common bile duct.  Interval improvement, left pelvocalycectasis,     and slight improvement in the right pelvocalycectasis.   CONSULTATIONS:  None.   HISTORY OF PRESENT ILLNESS:  [name] is a 60 year old African-American  female who has been relatively healthy, who presents with a five-day history  of productive cough, rhinorrhea, and having chills.  The patient states that  four days prior to admission, she had vomiting that was primarily  posttussive, but she was unable to keep any liquids or foods down.  She also  had a decreased appetite.  Patient was seen one day prior to admission at  [facility].  Was noted to have UA suspicious for a urinary tract infection.  Was given Rocephin IV and a Levaquin course; however, she had not been able  to take her Levaquin.  Patient returned for a recheck and was found to have  a creatinine of 3.6.  The decision was made to send to [facility] emergency  department for evaluation and admission.   PHYSICAL EXAMINATION:  VITAL SIGNS:  Temperature 98.1, blood pressure  119/80, pulse 126.  GENERAL:  The patient is moderately ill-appearing but in no apparent  distress.  HEENT:  Dry mucous membranes.  CARDIOVASCULAR:  Normal.  LUNGS:  Bilateral crackles in the bases; however, there were no wheezes or  rhonchi.  ABDOMEN:  Epigastric tenderness to palpation but no rebound or guarding.  BACK:  No CVA tenderness at the time of admission.   LABORATORY DATA:  On admission, the patient had a white count of 21.1,  hemoglobin 12.6.  Sodium 137, potassium 3.7, chloride 107, bicarb 21, BUN  39, creatinine 2.3, total bilirubin 1.7.   Urine culture was negative.   Blood cultures were also negative.  Please see the admission H&P for full  admission details.   HOSPITAL COURSE:  1. Pyelonephritis:  Patient was originally decided to be started on Tequin;     however, due  to hospital protocol, the patient was started on Avelox.     This was continued for approximately one day after admission; however,     the patient did not improve.  The patient was then started on Rocephin     and azithromycin, IV antibiotics.  She began to show slow improvement in     approximately three days after the start of IV antibiotics.  The patient     did not spike any temperatures.  White blood cell count returned to     normal.  Creatinine also returned to normal to a level of 1.1.  Patient     began to tolerate IV fluids with a saline lock.  At the time of     discharge, the patient was tolerating p.o. without complication.  2. Bilateral pneumonia:  The antibiotic course, as stated above.  The     patient would have periods of hyperventilation.  It is unclear whether     she was undergoing actual respiratory distress or if she was simply     anxious.  The patient's saturations remained above 92% at all times.  As     stated above, she did not spike any temperatures greater than 100.4     degrees.  At the time of discharge, the patient's respiratory status was     stable on room air.  In addition, lung  exam improved to reveal no     crackles by the time of discharge.  3. Acute renal failure:  This is believed to be due to the above problem #1.     Patient was given vigorous IV fluid hydration.  Patient's urinary output     increased dramatically following aggressive IV fluids, as did her     creatinine return to normal.  4. Anemia:  Following hydration, the patient's hemoglobin did drop to 8.9;     however, at the time of discharge, the patient's hemoglobin is 9.7.  She     was discharged with iron, given her severe disease.  With iron, the     patient's hemoglobin should return back to normal.  5. Hyperthyroidism:  Patient does have a diagnosis of hyperthyroidism from     May.  TSH drawn during hyper bilious was 0.09.  T3 and T4 were within     normal limits; however, given the patient's severe disease, these labs     are less than accurate.  Patient's TSH and thyroid function will be     followed up as an outpatient.   DISCHARGE DESTINATION:  Patient was discharged home.   DISCHARGE MEDICATIONS:  1. Ceftin 500 mg p.o. b.i.d. x9 days.  2. Metoprolol 50 mg p.o. b.i.d.  3. Iron sulfate 325 mg p.o. q.d.   DISCHARGE INSTRUCTIONS:  None.   FOLLOW-UP APPOINTMENT:  Patient was instructed to follow up with [facility] on January 10th at 3 p.m.                                                Franchot Mimes, MD    TV/MEDQ  D:  02/03/2003  T:  02/04/2003  Job:  [id number]

## 2010-07-06 NOTE — Procedures (Signed)
[facility]  Patient:    [name], [name] Visit Number: [id number] MRN: [id number]          Service Type: END Location: ENDO Attending Physician:  Sabino Gasser Dictated by:   Sabino Gasser, M.D. Proc. Date: 04/20/01 Admit Date:  04/20/2001                             Procedure Report  DATE OF BIRTH:  08/14/1950.  PROCEDURE:  Upper endoscopy.  INDICATIONS:  Gastroesophageal reflux disease.  ANESTHESIA:  Demerol 60 mg, Versed 6 mg.  DESCRIPTION OF PROCEDURE:  With the patient mildly sedated in the left lateral decubitus position, the Olympus videoscopic endoscope was inserted in the mouth and passed under direct vision through the esophagus, which appeared normal. There was no evidence of Barretts. There was a small hiatal hernia seen. We entered into the stomach. Fundus, body, antrum, duodenal bulb, second portion of duodenum all appeared normal. Photographs taken. From this point, the endoscope was slowly withdrawn taking circumferential views of the entire duodenal mucosa until the endoscope then pulled back into the stomach and placed in retroflexion to view the stomach from below. The hiatal hernia was once again visualized. The endoscope was then straightened and withdrawn taking circumferential views of the remaining gastric and esophageal mucosa. The patients vital signs and pulse oximeter remained stable. The patient tolerated the procedure well without apparent complications.  FINDINGS:  Hiatal hernia; otherwise unremarkable endoscopic examination.  PLAN:  Proceed to colonoscopy as planned. Dictated by:   Sabino Gasser, M.D. Attending Physician:  Sabino Gasser DD:  04/20/01 TD:  04/20/01 Job: [id number]

## 2010-12-06 LAB — BASIC METABOLIC PANEL
BUN: 20
CO2: 29
Calcium: 9.8
Chloride: 102
Creatinine, Ser: 1.15
GFR calc Af Amer: 59 — ABNORMAL LOW
GFR calc non Af Amer: 49 — ABNORMAL LOW
Glucose, Bld: 100 — ABNORMAL HIGH
Potassium: 3.8
Sodium: 137

## 2010-12-06 LAB — D-DIMER, QUANTITATIVE: D-Dimer, Quant: <0.22

## 2010-12-06 LAB — DIFFERENTIAL
Basophils Absolute: 0
Basophils Relative: 1
Eosinophils Absolute: 0.1
Eosinophils Relative: 1
Lymphocytes Relative: 21
Lymphs Abs: 1.5
Monocytes Absolute: 0.7
Monocytes Relative: 10
Neutro Abs: 4.8
Neutrophils Relative %: 67

## 2010-12-06 LAB — CBC
HCT: 34.5 — ABNORMAL LOW
Hemoglobin: 11.6 — ABNORMAL LOW
MCHC: 33.6
MCV: 84.1
Platelets: 333
RBC: 4.1
RDW: 14.8 — ABNORMAL HIGH
WBC: 7.2

## 2012-01-22 ENCOUNTER — Ambulatory Visit (HOSPITAL_COMMUNITY)
Admit: 2012-01-22 | Discharge: 2012-01-22 | Disposition: A | Discharge status: Home / Self Care | Payer: 59 | Attending: Family Medicine | Admitting: Family Medicine

## 2012-01-22 ENCOUNTER — Encounter (HOSPITAL_COMMUNITY): Payer: Self-pay

## 2012-01-22 ENCOUNTER — Emergency Department (HOSPITAL_COMMUNITY)
Admission: EM | Admit: 2012-01-22 | Discharge: 2012-01-22 | Disposition: A | Discharge status: Other Acute Inpatient Hospital | Payer: 59 | Source: Home / Self Care | Attending: Family Medicine | Admitting: Family Medicine

## 2012-01-22 DIAGNOSIS — R52 Pain, unspecified: Secondary | ICD-10-CM | POA: Insufficient documentation

## 2012-01-22 DIAGNOSIS — M79609 Pain in unspecified limb: Secondary | ICD-10-CM

## 2012-01-22 DIAGNOSIS — M7989 Other specified soft tissue disorders: Secondary | ICD-10-CM

## 2012-01-22 DIAGNOSIS — I252 Old myocardial infarction: Secondary | ICD-10-CM | POA: Insufficient documentation

## 2012-01-22 DIAGNOSIS — M79604 Pain in right leg: Secondary | ICD-10-CM

## 2012-01-22 DIAGNOSIS — Z86718 Personal history of other venous thrombosis and embolism: Secondary | ICD-10-CM | POA: Insufficient documentation

## 2012-01-22 DIAGNOSIS — Z7901 Long term (current) use of anticoagulants: Secondary | ICD-10-CM | POA: Insufficient documentation

## 2012-01-22 HISTORY — DX: Essential (primary) hypertension: I10

## 2012-01-22 HISTORY — DX: Type 2 diabetes mellitus without complications: E11.9

## 2012-01-22 LAB — PROTIME-INR
INR: 1.24 (ref 0.00–1.49)
Prothrombin Time: 15.4 seconds — ABNORMAL HIGH (ref 11.6–15.2)

## 2012-01-22 NOTE — ED Provider Notes (Addendum)
History     CSN: [id number]  Arrival date & time 01/22/12  1200   First MD Initiated Contact with Patient 01/22/12 1301      Chief Complaint  Patient presents with  . 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 On chronic Coumadin. Comes here complaining of right leg discrete swelling and pain for 3 weeks. Patient reports that she scratch her right lower leg several weeks ago but is already healed patient is unsure if had anything to do with her symptoms. She denies claudication and states that she has been standing for prolonged time daily in the last few weeks. No changes in temperature. Denies cough, difficulty breathing or chest pain. Patient states she has not had her INR checked in about 3 months.    Past Medical History  Diagnosis Date  . Hypertension   . Diabetes mellitus without complication   . Thrombus     History reviewed. No pertinent past surgical history.  History reviewed. No pertinent family history.  History  Substance Use Topics  . Smoking status: Not on file  . Smokeless tobacco: Not on file  . Alcohol Use:     OB History    Grav Para Term Preterm Abortions TAB SAB Ect Mult Living                  Review of Systems  Constitutional: Negative for fever and chills.  Respiratory: Negative for cough, chest tightness and shortness of breath.   Cardiovascular: Positive for leg swelling. Negative for chest pain and palpitations.  Gastrointestinal: Negative for nausea, vomiting and abdominal pain.  Skin: Negative for rash and wound.  Neurological: Negative for dizziness.  All other systems reviewed and are negative.    Allergies  Review of patient's allergies indicates no known allergies.  Home Medications   Current  Outpatient Rx  Name  Route  Sig  Dispense  Refill  . LEVOTHYROXINE SODIUM 200 MCG PO TABS   Oral   Take 200 mcg by mouth daily.         Marland Kitchen METFORMIN HCL 1000 MG PO TABS   Oral   Take 1,000 mg by mouth 1 day or 1 dose.         Marland Kitchen METOPROLOL TARTRATE 25 MG PO TABS   Oral   Take 50 mg by mouth 2 (two) times daily.         . WARFARIN SODIUM 7.5 MG PO TABS   Oral   Take 7.5 mg by mouth daily.           BP 155/85  Pulse 77  Temp 97.8 F (36.6 C) (Oral)  Resp 18  SpO2 98%  Physical Exam  Nursing note and vitals reviewed. Constitutional: She is oriented to person, place, and time. She appears well-developed and well-nourished. No distress.  HENT:  Head: Normocephalic and atraumatic.  Eyes: Conjunctivae normal are normal. No scleral icterus.  Neck: Neck supple. No JVD present.  Cardiovascular: Normal rate, regular rhythm and normal heart sounds.  Exam reveals no gallop and no friction rub.   No murmur heard. Pulmonary/Chest: Effort normal and breath sounds normal. No respiratory distress. She has no wheezes. She has no rales. She exhibits no tenderness.  Musculoskeletal:       Right leg: No obvious swelling or erythema. Reported tenderness to palpation in lower leg  lateral and medial aspects as well as calf. Also reported tenderness in the medial thigh. No pitting edema, skin is soft, no indurated and no tense.  Right lower leg measured 17.5 inches at mid calf compare with left lower leg 18 inches at same level. No obvious findings suggestive of Baker's cyst. Knee exam is normal. No knee effusion. Both lower extremities equally warm. Patent dorsal pedal and tibial posterior pulses.     Neurological: She is alert and oriented to person, place, and time.  Skin: No rash noted. She is not diaphoretic.    ED Course  Procedures (including critical care time)   Labs Reviewed  PROTIME-INR   No results found.   1. Leg pain, right       MDM  61 year old female with  history of diabetes and coagulopathy with prior right leg DVT, already on chronic coumadin here complaining of right leg pain and swelling for the last 3 weeks. Clinical findings no classic for DVT. Patient is high risk. INR was rechecked and Doppler was ordered to be done today. Will contact patient with INR and Dopplex results as soon as they get reported to me.        Sharin Grave, MD 01/22/12 1753  Update: received call from vascular lab, no evidence or blood clots on low extremity venus duplex studies. INR is sub therapeutic patient has not followed at her coumadin clinic for 3 months. Unsure about compliance will call to inform about test results. If confirms compliance could increase coumadin by 15% weekly dose and recheck protime in 1 week.   Sharin Grave, MD 01/22/12 1803  Called patient to her cell phone, left a message about lab results and need to follow up at her coumadin clinic.   Sharin Grave, MD 01/24/12 0400

## 2012-01-22 NOTE — Progress Notes (Signed)
VASCULAR LAB PRELIMINARY  PRELIMINARY  PRELIMINARY  PRELIMIN   Right lower extremity venous duplex has been completed.    Preliminary report:  Right lower extremity is negative for deep and superficial vein thrombosis.Report called to Dr. Rosanna Randy.  Willia Lampert, 01/22/2012, 5:55 PM

## 2012-01-22 NOTE — ED Notes (Signed)
States she has been having pani and swelling in her right lower leg for past 3 weeks. Had sustained a minor injury , and had reportedly been up on her feet a lot lately . Reported history of "clot " in her right leg. On warfarin, metformin

## 2012-01-23 ENCOUNTER — Other Ambulatory Visit (HOSPITAL_COMMUNITY): Payer: Self-pay | Admitting: Vascular Surgery

## 2012-02-03 ENCOUNTER — Other Ambulatory Visit: Payer: Self-pay | Admitting: Internal Medicine

## 2012-02-03 DIAGNOSIS — Z1231 Encounter for screening mammogram for malignant neoplasm of breast: Secondary | ICD-10-CM

## 2012-02-13 ENCOUNTER — Ambulatory Visit
Admission: RE | Admit: 2012-02-13 | Discharge: 2012-02-13 | Disposition: A | Discharge status: Home / Self Care | Payer: 59 | Source: Ambulatory Visit | Attending: Internal Medicine | Admitting: Internal Medicine

## 2012-02-13 DIAGNOSIS — Z1231 Encounter for screening mammogram for malignant neoplasm of breast: Secondary | ICD-10-CM

## 2012-12-25 ENCOUNTER — Encounter: Payer: Self-pay | Admitting: Internal Medicine

## 2013-01-11 ENCOUNTER — Other Ambulatory Visit: Payer: Self-pay | Admitting: Obstetrics & Gynecology

## 2013-01-28 ENCOUNTER — Telehealth: Payer: Self-pay | Admitting: *Deleted

## 2013-01-28 ENCOUNTER — Ambulatory Visit (AMBULATORY_SURGERY_CENTER): Payer: Self-pay | Admitting: *Deleted

## 2013-01-28 VITALS — Ht 64.0 in | Wt 188.4 lb

## 2013-01-28 DIAGNOSIS — Z86010 Personal history of colonic polyps: Secondary | ICD-10-CM

## 2013-01-28 MED ORDER — NA SULFATE-K SULFATE-MG SULF 17.5-3.13-1.6 GM/177ML PO SOLN: 1.0000 | Freq: Once | ORAL | Status: DC

## 2013-01-28 NOTE — Telephone Encounter (Signed)
Pt says she is going to try to find out where procedure was in 2007.  She will call and ask for Patti [place] and let her know if she was able to get information.  Release of information form signed at [facility]

## 2013-01-28 NOTE — Progress Notes (Signed)
No allergies to eggs or soy. No problems with anesthesia.  

## 2013-01-28 NOTE — Telephone Encounter (Signed)
Dr Leone Payor: pt is scheduled for direct colonoscopy 02/24/13.  Colonoscopy 2003 with Dr Virginia Rochester; pt had hemorrhoids (report is in [facility].)   Colonoscopy 7 years ago; pt does not remember where or with who.  She does say that she had polyps.  Is pt okay for direct colon?  Thanks, [employer]

## 2013-01-29 ENCOUNTER — Encounter: Payer: Self-pay | Admitting: Internal Medicine

## 2013-01-29 NOTE — Telephone Encounter (Signed)
If she cannot get the records then best we can do is patient hx and will proceed w/ colonoscopy

## 2013-02-09 NOTE — Telephone Encounter (Signed)
Spoke with patient , she has been unable to find Drs. Name of last procedure .  I informed her that per Dr. Leone Payor we will proceed with the colonoscopy which she has r/s for 04/13/13.  She asked if she has to pre-medicate with antibiotics like she does for dental procedures.   I told her we do not require that.

## 2013-02-24 ENCOUNTER — Encounter: Payer: 59 | Admitting: Internal Medicine

## 2013-04-13 ENCOUNTER — Encounter: Payer: BC Managed Care – PPO | Admitting: Internal Medicine

## 2013-07-27 ENCOUNTER — Other Ambulatory Visit: Payer: Self-pay | Admitting: Cardiology

## 2013-07-30 ENCOUNTER — Other Ambulatory Visit: Payer: Self-pay | Admitting: *Deleted

## 2013-08-02 ENCOUNTER — Other Ambulatory Visit: Payer: Self-pay | Admitting: *Deleted

## 2013-08-02 MED ORDER — METOPROLOL SUCCINATE ER 25 MG PO TB24: 25.0000 mg | ORAL_TABLET | Freq: Every day | ORAL | Status: DC

## 2013-08-02 NOTE — Telephone Encounter (Signed)
Rx refill sent to patient pharmacy   

## 2013-08-11 ENCOUNTER — Other Ambulatory Visit: Payer: Self-pay | Admitting: Internal Medicine

## 2013-08-19 NOTE — Telephone Encounter (Signed)
Rx was sent to pharmacy electronically. Patient needs an office visit for future refills. Last office visit - 05/13/2012 with Dr Wells Guiles.

## 2013-08-30 ENCOUNTER — Other Ambulatory Visit: Payer: Self-pay | Admitting: Internal Medicine

## 2013-09-01 NOTE — Telephone Encounter (Signed)
Rx refill denied. Patient needs appointment and was informed on 08/19/13, yet still has not made appt.

## 2013-09-03 ENCOUNTER — Other Ambulatory Visit: Payer: Self-pay | Admitting: Internal Medicine

## 2013-09-03 NOTE — Telephone Encounter (Signed)
Rx refill denied and message sent to schedulers for patient to make appointment before refill can be approved

## 2013-09-28 ENCOUNTER — Other Ambulatory Visit: Payer: Self-pay | Admitting: Internal Medicine

## 2013-10-01 ENCOUNTER — Encounter: Payer: Self-pay | Admitting: Cardiovascular Disease

## 2013-10-01 ENCOUNTER — Telehealth: Payer: Self-pay | Admitting: Internal Medicine

## 2013-10-01 NOTE — Telephone Encounter (Signed)
Closed encounter °

## 2013-11-10 ENCOUNTER — Encounter: Payer: Self-pay | Admitting: *Deleted

## 2013-11-11 ENCOUNTER — Ambulatory Visit (INDEPENDENT_AMBULATORY_CARE_PROVIDER_SITE_OTHER): Payer: BC Managed Care – PPO | Admitting: Internal Medicine

## 2013-11-11 ENCOUNTER — Encounter: Payer: Self-pay | Admitting: Internal Medicine

## 2013-11-11 VITALS — BP 120/80 | HR 73 | Ht 65.0 in | Wt 187.0 lb

## 2013-11-11 DIAGNOSIS — I10 Essential (primary) hypertension: Secondary | ICD-10-CM

## 2013-11-11 DIAGNOSIS — I80299 Phlebitis and thrombophlebitis of other deep vessels of unspecified lower extremity: Secondary | ICD-10-CM

## 2013-11-11 MED ORDER — METOPROLOL SUCCINATE ER 25 MG PO TB24: ORAL_TABLET | ORAL | Status: DC

## 2013-11-11 MED ORDER — LOSARTAN POTASSIUM 50 MG PO TABS: ORAL_TABLET | ORAL | Status: DC

## 2013-11-11 NOTE — Patient Instructions (Signed)
Your physician wants you to follow-up in: 1 year with Dr. Hilty. You will receive a reminder letter in the mail two months in advance. If you don't receive a letter, please call our office to schedule the follow-up appointment.  

## 2013-11-11 NOTE — Progress Notes (Signed)
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 She may have had a small PE at the time, too, but was not diagnosed. 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 She also has sleep apnea, on CPAP, has done well, but has stopped using it, however, because she did not feel it helped her. Last year she was in [place], reported to have had some chest pains, which were thought to be musculoskeletal related to her Lipitor, and they recommended discontinuing that. Overall, she has done fairly well and has had no further problems with palpitations or any chest pain.   Today she returns for followup. She reports that sometime in the past year she had an episode of bleeding after she cut her finger which took more than 10 minutes to stop. This was very scary for her and she decided to discontinue taking warfarin. She said was not related to cost or the convenience of getting her INRs checked. She does understand that she has a factor V Leiden deficiency and even though her DVT/PE was provoked the injury, she is at increased risk for recurrent DVT or PE. Aspirin may only provide minimal benefit.  She was also commended and the fact that she's lost over 20 pounds. She's been active and has been exercising 4 days a week.  PMHx:  Past Medical History  Diagnosis Date  . Hypertension   . Diabetes mellitus without complication   . DVT (deep venous thrombosis)     2007  . NSTEMI (non-ST elevated myocardial infarction)     2011 - possibly r/t DVT/embolization?   Marland Kitchen Hypothyroidism   . Factor V Leiden     deficiency   . Sleep apnea   . GERD (gastroesophageal reflux disease)     Past Surgical History  Procedure Laterality Date  . Tubal ligation  1995  .  Inguinal hernia repair      when 45 months old  . Transthoracic echocardiogram  2011    EF 55-60%, trivial MR, TR, pulm valve regurg  . Cardiac catheterization  10/21/2009    mild bridging(?) segment in mid LAD, otherwise normal coronaries (Dr. Alma Friendly)  . Lower extremity arterial doppler  2011    normal LEA  . Sleep study  2011    AHI 8.5 and REM AHI 18    FAMHx:  Family History  Problem Relation Age of Onset  . Colon cancer Neg Hx   . Lung cancer Father   . Other Mother     childbirth - enlarged heart    SOCHx:   reports that she has never smoked. She has never used smokeless tobacco. She reports that she drinks alcohol. She reports that she does not use illicit drugs.  ALLERGIES:  No Known Allergies  ROS: A comprehensive review of systems was negative.  HOME MEDS: Current Outpatient Prescriptions  Medication Sig Dispense Refill  . Ascorbic Acid (VITAMIN C) 1000 MG tablet Take 1,000 mg by mouth daily.      Marland Kitchen aspirin 81 MG tablet Take 81 mg by mouth daily.      . Cholecalciferol (VITAMIN D-3) 1000 UNITS CAPS Take by mouth daily.      Marland Kitchen co-enzyme Q-10 30 MG capsule Take 100  mg by mouth daily.      Marland Kitchen levothyroxine (SYNTHROID, LEVOTHROID) 200 MCG tablet Take 200 mcg by mouth daily.      Marland Kitchen losartan (COZAAR) 50 MG tablet TAKE 1 TABLET BY MOUTH EVERY DAY  90 tablet  3  . Magnesium 200 MG TABS Take by mouth daily.      . metFORMIN (GLUCOPHAGE) 1000 MG tablet Take 1,000 mg by mouth 1 day or 1 dose.      . metoprolol succinate (TOPROL-XL) 25 MG 24 hr tablet TAKE 1 TABLET BY MOUTH EVERY DAY  90 tablet  3  . Multiple Vitamin (MULTIVITAMIN) tablet Take 1 tablet by mouth daily.      . Multiple Vitamin (STRESS FORMULA 500/BIOTIN PO) Take by mouth daily.      . Omega-3 Fatty Acids (FISH OIL) 300 MG CAPS Take by mouth daily.      . polyethylene glycol powder (GLYCOLAX/MIRALAX) powder Take 15 g by mouth as needed.       . Potassium Gluconate 595 MG TBCR Take by mouth daily.      Marland Kitchen  PREVIDENT 5000 BOOSTER PLUS 1.1 % PSTE Take 1 capsule by mouth as needed.      . vitamin B-12 (CYANOCOBALAMIN) 1000 MCG tablet Take 1,000 mcg by mouth daily.      Marland Kitchen zolpidem (AMBIEN CR) 6.25 MG CR tablet Take 6.25 mg by mouth at bedtime as needed for sleep.       No current facility-administered medications for this visit.    LABS/IMAGING: No results found for this or any previous visit (from the past 48 hour(s)). No results found.  VITALS: BP 120/80  Pulse 73  Ht 5\' 5"  (1.651 m)  Wt 187 lb (84.823 kg)  BMI 31.12 kg/m2  EXAM: General appearance: alert and no distress Neck: no carotid bruit and no JVD Lungs: clear to auscultation bilaterally Heart: regular rate and rhythm, S1, S2 normal, no murmur, click, rub or gallop Abdomen: soft, non-tender; bowel sounds normal; no masses,  no organomegaly Extremities: extremities normal, atraumatic, no cyanosis or edema Pulses: 2+ and symmetric Skin: Skin color, texture, turgor normal. No rashes or lesions Neurologic: Grossly normal PSych: Normal  EKG: Normal sinus rhythm at 73  ASSESSMENT: 1. History of NSTEMI secondary to thrombosis 2. History of DVT-on warfarin however self discontinued 3. GERD 4. Possible factor V Leiden deficiency 5. HTN - controlled  PLAN: 1.   [name] is doing very well. She's had no recurrence of DVT, PE or other associated symptoms. She took herself off of warfarin. She does realize that she may be increased risk of the Novo clot and I've encouraged her to continue stay on at least aspirin. Her blood pressure is well controlled. Otherwise she is asymptomatic. She occasionally has problems with reflux.  Followup can be an early or sooner as necessary.  Pixie Casino, MD, [facility] Attending Cardiologist [facility]  Janyiah Silveri C 11/11/2013, 1:22 PM

## 2013-11-30 ENCOUNTER — Telehealth: Payer: Self-pay | Admitting: Internal Medicine

## 2013-11-30 NOTE — Telephone Encounter (Signed)
Pt called and left a vm stating that she has not heard back from her nurse and she called yesterday in regards to her UTI. Please follow up with pt.

## 2013-12-01 NOTE — Telephone Encounter (Signed)
Left voice message to return call 

## 2013-12-02 ENCOUNTER — Telehealth: Payer: Self-pay | Admitting: Internal Medicine

## 2013-12-02 ENCOUNTER — Telehealth: Payer: Self-pay | Admitting: Emergency Medicine

## 2013-12-02 NOTE — Telephone Encounter (Signed)
Pt. Returning call from nurse, please f/u with pt.

## 2013-12-02 NOTE — Telephone Encounter (Signed)
Left message for pt to call to schedule nurse visit

## 2014-03-21 ENCOUNTER — Ambulatory Visit: Payer: Self-pay | Admitting: Internal Medicine

## 2014-08-17 ENCOUNTER — Encounter: Payer: Self-pay | Admitting: Internal Medicine

## 2014-08-17 ENCOUNTER — Other Ambulatory Visit: Payer: Self-pay

## 2014-08-19 ENCOUNTER — Other Ambulatory Visit: Payer: Self-pay

## 2014-08-19 DIAGNOSIS — Z1231 Encounter for screening mammogram for malignant neoplasm of breast: Secondary | ICD-10-CM

## 2014-08-30 ENCOUNTER — Ambulatory Visit: Payer: Self-pay

## 2014-10-13 ENCOUNTER — Ambulatory Visit
Admission: RE | Admit: 2014-10-13 | Discharge: 2014-10-13 | Disposition: A | Discharge status: Home / Self Care | Payer: Commercial Managed Care - HMO | Source: Ambulatory Visit

## 2014-10-13 DIAGNOSIS — Z1231 Encounter for screening mammogram for malignant neoplasm of breast: Secondary | ICD-10-CM

## 2014-10-18 ENCOUNTER — Ambulatory Visit (AMBULATORY_SURGERY_CENTER): Payer: Self-pay

## 2014-10-18 VITALS — Ht 64.0 in | Wt 177.8 lb

## 2014-10-18 DIAGNOSIS — Z86010 Personal history of colonic polyps: Secondary | ICD-10-CM

## 2014-10-18 NOTE — Progress Notes (Signed)
Per pt, no allergies to soy or egg products.Pt not taking any weight loss meds or using  O2 at home.   Pt came into the office today for her pre-visit prior to her colonoscopy with Dr Carlean Purl on 11/02/14. Pt states she had an colonscopy with Dr Lajoyce Corners in the past, but was unable to get the records.She has a history of colon polyps.

## 2014-10-19 ENCOUNTER — Encounter: Payer: Self-pay | Admitting: Internal Medicine

## 2014-11-02 ENCOUNTER — Encounter: Payer: Self-pay | Admitting: Internal Medicine

## 2014-11-02 ENCOUNTER — Ambulatory Visit (AMBULATORY_SURGERY_CENTER): Payer: Commercial Managed Care - HMO | Admitting: Internal Medicine

## 2014-11-02 VITALS — BP 133/77 | HR 66 | Temp 97.8°F | Resp 13 | Ht 65.0 in | Wt 187.0 lb

## 2014-11-02 DIAGNOSIS — D123 Benign neoplasm of transverse colon: Secondary | ICD-10-CM | POA: Diagnosis not present

## 2014-11-02 DIAGNOSIS — K635 Polyp of colon: Secondary | ICD-10-CM | POA: Diagnosis not present

## 2014-11-02 DIAGNOSIS — Z1211 Encounter for screening for malignant neoplasm of colon: Secondary | ICD-10-CM | POA: Diagnosis present

## 2014-11-02 LAB — GLUCOSE, CAPILLARY
Glucose-Capillary: 185 mg/dL — ABNORMAL HIGH (ref 65–99)
Glucose-Capillary: 155 mg/dL — ABNORMAL HIGH (ref 65–99)

## 2014-11-02 MED ORDER — SODIUM CHLORIDE 0.9 % IV SOLN: 500.0000 mL | INTRAVENOUS | Status: DC

## 2014-11-02 NOTE — Progress Notes (Signed)
Transferred to recovery room. A/O x3, pleased with MAC.  VSS.  Report to Celia, RN. 

## 2014-11-02 NOTE — Patient Instructions (Addendum)
I found and removed one tiny polyp.    You also have a condition called diverticulosis - common and not usually a problem. Please read the handout provided.  I will let you know pathology results and when to have another routine colonoscopy by mail.  I appreciate the opportunity to care for you. Gatha Mayer, MD, [facility]   Discharge instructions given. Handouts on polyps and diverticulosis. Resume previous medications. YOU HAD AN ENDOSCOPIC PROCEDURE TODAY AT [facility]:   Refer to the procedure report that was given to you for any specific questions about what was found during the examination.  If the procedure report does not answer your questions, please call your gastroenterologist to clarify.  If you requested that your care partner not be given the details of your procedure findings, then the procedure report has been included in a sealed envelope for you to review at your convenience later.  YOU SHOULD EXPECT: Some feelings of bloating in the abdomen. Passage of more gas than usual.  Walking can help get rid of the air that was put into your GI tract during the procedure and reduce the bloating. If you had a lower endoscopy (such as a colonoscopy or flexible sigmoidoscopy) you may notice spotting of blood in your stool or on the toilet paper. If you underwent a bowel prep for your procedure, you may not have a normal bowel movement for a few days.  Please Note:  You might notice some irritation and congestion in your nose or some drainage.  This is from the oxygen used during your procedure.  There is no need for concern and it should clear up in a day or so.  SYMPTOMS TO REPORT IMMEDIATELY:   Following lower endoscopy (colonoscopy or flexible sigmoidoscopy):  Excessive amounts of blood in the stool  Significant tenderness or worsening of abdominal pains  Swelling of the abdomen that is new, acute  Fever of 100F or higher   For urgent or emergent  issues, a gastroenterologist can be reached at any hour by calling [phone].   DIET: Your first meal following the procedure should be a small meal and then it is ok to progress to your normal diet. Heavy or fried foods are harder to digest and may make you feel nauseous or bloated.  Likewise, meals heavy in dairy and vegetables can increase bloating.  Drink plenty of fluids but you should avoid alcoholic beverages for 24 hours.  ACTIVITY:  You should plan to take it easy for the rest of today and you should NOT DRIVE or use heavy machinery until tomorrow (because of the sedation medicines used during the test).    FOLLOW UP: Our staff will call the number listed on your records the next business day following your procedure to check on you and address any questions or concerns that you may have regarding the information given to you following your procedure. If we do not reach you, we will leave a message.  However, if you are feeling well and you are not experiencing any problems, there is no need to return our call.  We will assume that you have returned to your regular daily activities without incident.  If any biopsies were taken you will be contacted by phone or by letter within the next 1-3 weeks.  Please call us at [phone] if you have not heard about the biopsies in 3 weeks.    SIGNATURES/CONFIDENTIALITY: You and/or your care partner have signed  paperwork which will be entered into your electronic medical record.  These signatures attest to the fact that that the information above on your After Visit Summary has been reviewed and is understood.  Full responsibility of the confidentiality of this discharge information lies with you and/or your care-partner.

## 2014-11-02 NOTE — Op Note (Signed)
[facility]  [address] [place], [address]   COLONOSCOPY PROCEDURE REPORT  PATIENT: [name], [name]  MR#: [id number] BIRTHDATE: 1950-06-13 , 13  yrs. old GENDER: female ENDOSCOPIST: Gatha Mayer, MD, [facility] PROCEDURE DATE:  11/02/2014 PROCEDURE:   Colonoscopy, screening and Colonoscopy with biopsy First Screening Colonoscopy - Avg.  risk and is 50 yrs.  old or older - No.  Prior Negative Screening - Now for repeat screening. 10 or more years since last screening  History of Adenoma - Now for follow-up colonoscopy & has been > or = to 3 yrs.  N/A  Polyps removed today? Yes ASA CLASS:   Class II INDICATIONS:Screening for colonic neoplasia and Colorectal Neoplasm Risk Assessment for this procedure is average risk. MEDICATIONS: Propofol 175 mg IV and Monitored anesthesia care  DESCRIPTION OF PROCEDURE:   After the risks benefits and alternatives of the procedure were thoroughly explained, informed consent was obtained.  The digital rectal exam revealed no abnormalities of the rectum.   The LB [id number]  endoscope was introduced through the anus and advanced to the cecum, which was identified by both the appendix and ileocecal valve. No adverse events experienced.   The quality of the prep was excellent. (MiraLax was used)  The instrument was then slowly withdrawn as the colon was fully examined. Estimated blood loss is zero unless otherwise noted in this procedure report.      COLON FINDINGS: A polypoid shaped sessile polyp measuring 2 mm in size was found in the distal transverse colon.  A polypectomy was performed with cold forceps.  The resection was complete, the polyp tissue was completely retrieved and sent to histology.   There was mild diverticulosis noted in the sigmoid colon.   The examination was otherwise normal.  Retroflexed views revealed no abnormalities. The time to cecum = 1.9 Withdrawal time = 9.3   The scope was withdrawn and the  procedure completed. COMPLICATIONS: There were no immediate complications.  ENDOSCOPIC IMPRESSION: 1.   Sessile polyp was found in the distal transverse colon; polypectomy was performed with cold forceps 2.   Mild diverticulosis was noted in the sigmoid colon 3.   The examination was otherwise normal - excellent prep  RECOMMENDATIONS: Timing of repeat colonoscopy will be determined by pathology findings.  eSigned:  Gatha Mayer, MD, [facility] 11/02/2014 9:31 AM   cc: The Patient and Velna Hatchet, MD

## 2014-11-02 NOTE — Progress Notes (Signed)
Called to room to assist during endoscopic procedure.  Patient ID and intended procedure confirmed with present staff. Received instructions for my participation in the procedure from the performing physician.  

## 2014-11-03 ENCOUNTER — Telehealth: Payer: Self-pay

## 2014-11-03 NOTE — Telephone Encounter (Signed)
  Follow up Call-  Call back number 11/02/2014  Post procedure Call Back phone  # [phone]  Permission to leave phone message Yes     Patient questions:  Do you have a fever, pain , or abdominal swelling? No. Pain Score  0 *  Have you tolerated food without any problems? Yes.    Have you been able to return to your normal activities? Yes.    Do you have any questions about your discharge instructions: Diet   No. Medications  No. Follow up visit  No.  Do you have questions or concerns about your Care? No.  Actions: * If pain score is 4 or above: No action needed, pain <4.

## 2014-11-15 ENCOUNTER — Encounter: Payer: Self-pay | Admitting: Internal Medicine

## 2014-11-15 NOTE — Progress Notes (Signed)
Quick Note:  Polypoid mucosa repeat colonoscopy 2026 ______

## 2014-11-16 ENCOUNTER — Encounter: Payer: Self-pay | Admitting: Internal Medicine

## 2015-01-16 ENCOUNTER — Ambulatory Visit (INDEPENDENT_AMBULATORY_CARE_PROVIDER_SITE_OTHER): Payer: Commercial Managed Care - HMO | Admitting: Emergency Medicine

## 2015-01-16 VITALS — BP 130/78 | HR 80 | Temp 98.5°F | Resp 16 | Ht 64.2 in | Wt 175.0 lb

## 2015-01-16 DIAGNOSIS — L309 Dermatitis, unspecified: Secondary | ICD-10-CM

## 2015-01-16 MED ORDER — TERBINAFINE HCL 250 MG PO TABS: 250.0000 mg | ORAL_TABLET | Freq: Every day | ORAL | Status: DC

## 2015-01-16 MED ORDER — TRIAMCINOLONE ACETONIDE 0.1 % EX CREA: 1.0000 "application " | TOPICAL_CREAM | Freq: Two times a day (BID) | CUTANEOUS | Status: DC

## 2015-01-16 NOTE — Progress Notes (Signed)
Subjective:  Patient ID: [name], female    DOB: 07/02/1950  Age: 64 y.o. MRN: [id number]  CC: Rash   HPI [name] presents  she is had a rash on her left ring finger for the last 3 weeks. She describes as very pruritic. She has no allergy exposure denies any spread of the rash off her finger. And has had no improvement with over-the-counter medication.  History [name] has a past medical history of Hypertension; Diabetes mellitus without complication ([place]); DVT (deep venous thrombosis) ([place]); NSTEMI (non-ST elevated myocardial infarction) ([place]); Hypothyroidism; Factor V Leiden ([place]); Sleep apnea; GERD (gastroesophageal reflux disease); Constipation; Kidney disease; and Clotting disorder ([place]).   She has past surgical history that includes Tubal ligation (1995); Inguinal hernia repair; transthoracic echocardiogram (2011); Cardiac catheterization (10/21/2009); LOWER EXTREMITY ARTERIAL DOPPLER (2011); SLEEP STUDY (2011); Upper gastrointestinal endoscopy; and Colonoscopy.   Her  family history includes Cancer in her father and paternal grandmother; Diabetes in her father and paternal grandmother; Hypertension in her paternal grandfather; Lung cancer in her father; Mental illness in her paternal grandmother; Other in her mother. There is no history of Colon cancer.  She   reports that she has never smoked. She has never used smokeless tobacco. She reports that she does not drink alcohol or use illicit drugs.  Outpatient Prescriptions Prior to Visit  Medication Sig Dispense Refill  . aspirin 81 MG tablet Take 81 mg by mouth daily.    Marland Kitchen co-enzyme Q-10 30 MG capsule Take 100 mg by mouth as needed.     Marland Kitchen levothyroxine (SYNTHROID, LEVOTHROID) 200 MCG tablet Take 200 mcg by mouth daily.    Marland Kitchen losartan (COZAAR) 50 MG tablet TAKE 1 TABLET BY MOUTH EVERY DAY 90 tablet 3  . Melatonin 1 MG TABS Take by mouth as needed.    . metFORMIN (GLUCOPHAGE) 1000 MG tablet Take 1,000 mg by mouth 1  day or 1 dose.    . metoprolol succinate (TOPROL-XL) 25 MG 24 hr tablet TAKE 1 TABLET BY MOUTH EVERY DAY 90 tablet 3  . Multiple Vitamin (MULTIVITAMIN) tablet Take 1 tablet by mouth daily.    . Multiple Vitamin (STRESS FORMULA 500/BIOTIN PO) Take by mouth daily.    . Omega-3 Fatty Acids (FISH OIL) 300 MG CAPS Take by mouth daily.    Marland Kitchen PREVIDENT 5000 BOOSTER PLUS 1.1 % PSTE Take 1 capsule by mouth as needed.    . pyridOXINE (VITAMIN B-6) 100 MG tablet Take 100 mg by mouth daily.    . vitamin B-12 (CYANOCOBALAMIN) 1000 MCG tablet Take 1,000 mcg by mouth daily.    . Ascorbic Acid (VITAMIN C) 1000 MG tablet Take 1,000 mg by mouth daily.    . Cholecalciferol (VITAMIN D-3) 1000 UNITS CAPS Take by mouth daily.    . Magnesium 200 MG TABS Take by mouth daily.    . polyethylene glycol powder (GLYCOLAX/MIRALAX) powder Take 15 g by mouth as needed.     . Potassium Gluconate 595 MG TBCR Take by mouth daily.    Marland Kitchen zolpidem (AMBIEN CR) 6.25 MG CR tablet Take 6.25 mg by mouth at bedtime as needed for sleep.    . bisacodyl (DULCOLAX) 5 MG EC tablet Take 5 mg by mouth. Dulcolax 5mg  bowel prep #4-Take as directed     No facility-administered medications prior to visit.    Social History   Social History  . Marital Status: Widowed    Spouse Name: N/A  . Number of Children: 2  .  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 Smoking status: Never Smoker   . Smokeless tobacco: Never Used  . Alcohol Use: No     Comment: 2 glasses wine a month  . Drug Use: No  . Sexual Activity: Not Asked   Other Topics Concern  . None   Social History Narrative     Review of Systems  Constitutional: Negative for fever, chills and appetite change.  HENT: Negative for congestion, ear pain, postnasal drip, sinus pressure and sore throat.   Eyes: Negative for pain and redness.  Respiratory: Negative for cough, shortness of breath and  wheezing.   Cardiovascular: Negative for leg swelling.  Gastrointestinal: Negative for nausea, vomiting, abdominal pain, diarrhea, constipation and blood in stool.  Endocrine: Negative for polyuria.  Genitourinary: Negative for dysuria, urgency, frequency and flank pain.  Musculoskeletal: Negative for gait problem.  Skin: Positive for rash.  Neurological: Negative for weakness and headaches.  Psychiatric/Behavioral: Negative for confusion and decreased concentration. The patient is not nervous/anxious.     Objective:  BP 130/78 mmHg  Pulse 80  Temp(Src) 98.5 F (36.9 C) (Oral)  Resp 16  Ht 5' 4.2" (1.631 m)  Wt 175 lb (79.379 kg)  BMI 29.84 kg/m2  SpO2 98%  Physical Exam  Constitutional: She is oriented to person, place, and time. She appears well-developed and well-nourished.  HENT:  Head: Normocephalic and atraumatic.  Eyes: Conjunctivae are normal. Pupils are equal, round, and reactive to light.  Pulmonary/Chest: Effort normal.  Musculoskeletal: She exhibits no edema.  Neurological: She is alert and oriented to person, place, and time.  Skin: Skin is dry. Rash noted.  Psychiatric: She has a normal mood and affect. Her behavior is normal. Thought content normal.      Assessment & Plan:   [name] was seen today for rash.  Diagnoses and all orders for this visit:  Eczema  Other orders -     terbinafine (LAMISIL) 250 MG tablet; Take 1 tablet (250 mg total) by mouth daily. -     triamcinolone cream (KENALOG) 0.1 %; Apply 1 application topically 2 (two) times daily.   I have discontinued [name]'s bisacodyl. I am also having her start on terbinafine and triamcinolone cream. Additionally, I am having her maintain her metFORMIN, levothyroxine, zolpidem, aspirin, multivitamin, vitamin C, Potassium Gluconate, Magnesium, Multiple Vitamin (STRESS FORMULA 500/BIOTIN PO), vitamin B-12, co-enzyme Q-10, FISH OIL, Vitamin D-3, PREVIDENT 5000 BOOSTER PLUS, polyethylene glycol  powder, metoprolol succinate, losartan, pyridOXINE, and Melatonin.  Meds ordered this encounter  Medications  . terbinafine (LAMISIL) 250 MG tablet    Sig: Take 1 tablet (250 mg total) by mouth daily.    Dispense:  14 tablet    Refill:  0  . triamcinolone cream (KENALOG) 0.1 %    Sig: Apply 1 application topically 2 (two) times daily.    Dispense:  30 g    Refill:  0   Is not clear whether this represents a tinea or an eczema so I elected treated with Lamisil and triamcinolone and she'll follow-up if there's no improvement  Appropriate red flag conditions were discussed with the patient as well as actions that should be taken.  Patient expressed his understanding.  Follow-up: Return if symptoms worsen or fail to improve.  Roselee Culver, MD

## 2015-01-16 NOTE — Patient Instructions (Signed)
Eczema Eczema, also called atopic dermatitis, is a skin disorder that causes inflammation of the skin. It causes a red rash and dry, scaly skin. The skin becomes very itchy. Eczema is generally worse during the cooler winter months and often improves with the warmth of summer. Eczema usually starts showing signs in infancy. Some children outgrow eczema, but it may last through adulthood.  CAUSES  The exact cause of eczema is not known, but it appears to run in families. People with eczema often have a family history of eczema, allergies, asthma, or hay fever. Eczema is not contagious. Flare-ups of the condition may be caused by:   Contact with something you are sensitive or allergic to.   Stress. SIGNS AND SYMPTOMS  Dry, scaly skin.   Red, itchy rash.   Itchiness. This may occur before the skin rash and may be very intense.  DIAGNOSIS  The diagnosis of eczema is usually made based on symptoms and medical history. TREATMENT  Eczema cannot be cured, but symptoms usually can be controlled with treatment and other strategies. A treatment plan might include:  Controlling the itching and scratching.   Use over-the-counter antihistamines as directed for itching. This is especially useful at night when the itching tends to be worse.   Use over-the-counter steroid creams as directed for itching.   Avoid scratching. Scratching makes the rash and itching worse. It may also result in a skin infection (impetigo) due to a break in the skin caused by scratching.   Keeping the skin well moisturized with creams every day. This will seal in moisture and help prevent dryness. Lotions that contain alcohol and water should be avoided because they can dry the skin.   Limiting exposure to things that you are sensitive or allergic to (allergens).   Recognizing situations that cause stress.   Developing a plan to manage stress.  HOME CARE INSTRUCTIONS   Only take over-the-counter or  prescription medicines as directed by your health care provider.   Do not use anything on the skin without checking with your health care provider.   Keep baths or showers short (5 minutes) in warm (not hot) water. Use mild cleansers for bathing. These should be unscented. You may add nonperfumed bath oil to the bath water. It is best to avoid soap and bubble bath.   Immediately after a bath or shower, when the skin is still damp, apply a moisturizing ointment to the entire body. This ointment should be a petroleum ointment. This will seal in moisture and help prevent dryness. The thicker the ointment, the better. These should be unscented.   Keep fingernails cut short. Children with eczema may need to wear soft gloves or mittens at night after applying an ointment.   Dress in clothes made of cotton or cotton blends. Dress lightly, because heat increases itching.   A child with eczema should stay away from anyone with fever blisters or cold sores. The virus that causes fever blisters (herpes simplex) can cause a serious skin infection in children with eczema. SEEK MEDICAL CARE IF:   Your itching interferes with sleep.   Your rash gets worse or is not better within 1 week after starting treatment.   You see pus or soft yellow scabs in the rash area.   You have a fever.   You have a rash flare-up after contact with someone who has fever blisters.    This information is not intended to replace advice given to you by your health care   provider. Make sure you discuss any questions you have with your health care provider.   Document Released: 02/02/2000 Document Revised: 11/25/2012 Document Reviewed: 09/07/2012 Elsevier Interactive Patient Education 2016 [employer]  

## 2015-03-20 ENCOUNTER — Other Ambulatory Visit: Payer: Self-pay | Admitting: Emergency Medicine

## 2015-05-19 ENCOUNTER — Ambulatory Visit (INDEPENDENT_AMBULATORY_CARE_PROVIDER_SITE_OTHER): Payer: BLUE CROSS/BLUE SHIELD | Admitting: Family Medicine

## 2015-05-19 VITALS — BP 142/86 | HR 104 | Temp 98.0°F | Resp 16 | Ht 64.2 in | Wt 174.0 lb

## 2015-05-19 DIAGNOSIS — R197 Diarrhea, unspecified: Secondary | ICD-10-CM

## 2015-05-19 DIAGNOSIS — R112 Nausea with vomiting, unspecified: Secondary | ICD-10-CM | POA: Diagnosis not present

## 2015-05-19 DIAGNOSIS — E119 Type 2 diabetes mellitus without complications: Secondary | ICD-10-CM

## 2015-05-19 DIAGNOSIS — A084 Viral intestinal infection, unspecified: Secondary | ICD-10-CM | POA: Diagnosis not present

## 2015-05-19 DIAGNOSIS — E860 Dehydration: Secondary | ICD-10-CM

## 2015-05-19 LAB — POC MICROSCOPIC URINALYSIS (UMFC): Mucus: ABSENT

## 2015-05-19 LAB — POCT CBC
Granulocyte percent: 88.6 %G — AB (ref 37–80)
HCT, POC: 42.3 % (ref 37.7–47.9)
Hemoglobin: 14.3 g/dL (ref 12.2–16.2)
Lymph, poc: 0.8 (ref 0.6–3.4)
MCH, POC: 28.4 pg (ref 27–31.2)
MCHC: 33.9 g/dL (ref 31.8–35.4)
MCV: 83.9 fL (ref 80–97)
MID (cbc): 0.4 (ref 0–0.9)
MPV: 7.3 fL (ref 0–99.8)
POC Granulocyte: 9.9 — AB (ref 2–6.9)
POC LYMPH PERCENT: 7.4 %L — AB (ref 10–50)
POC MID %: 4 %M (ref 0–12)
Platelet Count, POC: 284 10*3/uL (ref 142–424)
RBC: 5.05 M/uL (ref 4.04–5.48)
RDW, POC: 14.4 %
WBC: 11.2 10*3/uL — AB (ref 4.6–10.2)

## 2015-05-19 LAB — POCT URINALYSIS DIP (MANUAL ENTRY)
Bilirubin, UA: NEGATIVE
Glucose, UA: NEGATIVE
Leukocytes, UA: NEGATIVE
Nitrite, UA: NEGATIVE
Protein Ur, POC: >=300 — AB
Spec Grav, UA: 1.025
Urobilinogen, UA: 0.2
pH, UA: 5

## 2015-05-19 LAB — GLUCOSE, POCT (MANUAL RESULT ENTRY): POC Glucose: 203 mg/dl — AB (ref 70–99)

## 2015-05-19 MED ORDER — ONDANSETRON 4 MG PO TBDP
4.0000 mg | ORAL_TABLET | Freq: Once | ORAL | Status: AC
  Administered 2015-05-19: 4 mg via ORAL

## 2015-05-19 MED ORDER — ONDANSETRON 4 MG PO TBDP: 4.0000 mg | ORAL_TABLET | Freq: Three times a day (TID) | ORAL | Status: DC | PRN

## 2015-05-19 NOTE — Progress Notes (Signed)
Patient ID: [name], female    DOB: 01/04/1951  Age: 65 y.o. MRN: [id number]  Chief Complaint  Patient presents with  . Emesis    Began during the night   . Diarrhea  . Hypertension  . Hyperglycemia    Glucose was 230 this AM     Subjective:   65 year old diabetic whose primary care doctors are at [place]. She has a history of diabetes. She was fine yesterday. At 2 AM she started vomiting and is vomited a number of times since then, twice since being here. She has not had any major abdominal pain. She has had diarrhea several times. She feels very dry and feels like she cannot keep anything down. She has had to wait a long time here today. She has not been febrile. She does not know of exposure to anyone else who is sick. She does not often get gastroenteritis. She said her sugar at 230 was higher than usual today. Her last A1c was 7. something. She feels very weak and dry..  Current allergies, medications, problem list, past/family and social histories reviewed.  Objective:  BP 142/86 mmHg  Pulse 104  Temp(Src) 98 F (36.7 C) (Oral)  Resp 16  Ht 5' 4.2" (1.631 m)  Wt 174 lb (78.926 kg)  BMI 29.67 kg/m2  No major acute distress. Her throat is clear the tongue looks a little dry. This lower dose of left side of the tip of the tongue, looks like she might be at it some time. Her neck was supple without nodes. Chest clear to auscultation. Heart regular without murmur. Abdomen has bowel sounds present. Soft without organomegaly or masses. Some mild nonspecific generalized tenderness. Extremities unremarkable.  Assessment & Plan:   Assessment: 1. Viral gastroenteritis   2. Nausea and vomiting, intractability of vomiting not specified, unspecified vomiting type   3. Diarrhea, unspecified type   4. Dehydration   5. Type 2 diabetes mellitus without complication, unspecified long term insulin use status ([facility])       Plan: Gastroenteritis Vomiting Diarrhea Weakness  and dehydration  Will hydrate her and check couple of labs I think this is probably just gastroenteritis. Give her a 4 mg ondansetron.  Orders Placed This Encounter  Procedures  . POCT CBC  . POCT glucose (manual entry)  . POCT Microscopic Urinalysis ([facility])  . POCT urinalysis dipstick    Meds ordered this encounter  Medications  . ondansetron (ZOFRAN-ODT) disintegrating tablet 4 mg    Sig:   . ondansetron (ZOFRAN ODT) 4 MG disintegrating tablet    Sig: Take 1 tablet (4 mg total) by mouth every 8 (eight) hours as needed for nausea or vomiting.    Dispense:  10 tablet    Refill:  0     Results for orders placed or performed in visit on 05/19/15  POCT CBC  Result Value Ref Range   WBC 11.2 (A) 4.6 - 10.2 K/uL   Lymph, poc 0.8 0.6 - 3.4   POC LYMPH PERCENT 7.4 (A) 10 - 50 %L   MID (cbc) 0.4 0 - 0.9   POC MID % 4.0 0 - 12 %M   POC Granulocyte 9.9 (A) 2 - 6.9   Granulocyte percent 88.6 (A) 37 - 80 %G   RBC 5.05 4.04 - 5.48 M/uL   Hemoglobin 14.3 12.2 - 16.2 g/dL   HCT, POC 42.3 37.7 - 47.9 %   MCV 83.9 80 - 97 fL   MCH, POC 28.4 27 -  31.2 pg   MCHC 33.9 31.8 - 35.4 g/dL   RDW, POC 14.4 %   Platelet Count, POC 284 142 - 424 K/uL   MPV 7.3 0 - 99.8 fL  POCT glucose (manual entry)  Result Value Ref Range   POC Glucose 203 (A) 70 - 99 mg/dl  POCT Microscopic Urinalysis ([facility])  Result Value Ref Range   WBC,UR,HPF,POC Few (A) None WBC/hpf   RBC,UR,HPF,POC Few (A) None RBC/hpf   Bacteria None None, Too numerous to count   Mucus Absent Absent   Epithelial Cells, UR Per Microscopy Few (A) None, Too numerous to count cells/hpf  POCT urinalysis dipstick  Result Value Ref Range   Color, UA yellow yellow   Clarity, UA clear clear   Glucose, UA negative negative   Bilirubin, UA negative negative   Ketones, POC UA trace (5) (A) negative   Spec Grav, UA 1.025    Blood, UA small (A) negative   pH, UA 5.0    Protein Ur, POC >=300 (A) negative   Urobilinogen, UA 0.2    Nitrite,  UA Negative Negative   Leukocytes, UA Negative Negative       Patient Instructions  Take the ondansetron 4 mg every 6 or 8 hours only if needed for nausea or vomiting  Drink plenty of fluids  If needed, take over-the-counter Imodium for diarrhea  Return at anytime if significantly worse. In the nighttime go to the emergency room if necessary.  Continue to watch your blood sugars carefully.     Return if symptoms worsen or fail to improve.   Xochilt Conant, MD 05/19/2015

## 2015-05-19 NOTE — Patient Instructions (Signed)
Take the ondansetron 4 mg every 6 or 8 hours only if needed for nausea or vomiting  Drink plenty of fluids  If needed, take over-the-counter Imodium for diarrhea  Return at anytime if significantly worse. In the nighttime go to the emergency room if necessary.  Continue to watch your blood sugars carefully.

## 2015-06-22 DIAGNOSIS — Z794 Long term (current) use of insulin: Secondary | ICD-10-CM | POA: Insufficient documentation

## 2015-06-22 DIAGNOSIS — E119 Type 2 diabetes mellitus without complications: Secondary | ICD-10-CM | POA: Insufficient documentation

## 2015-06-22 DIAGNOSIS — E1110 Type 2 diabetes mellitus with ketoacidosis without coma: Secondary | ICD-10-CM | POA: Insufficient documentation

## 2015-12-12 ENCOUNTER — Encounter (HOSPITAL_COMMUNITY): Payer: Self-pay

## 2015-12-12 ENCOUNTER — Emergency Department (HOSPITAL_COMMUNITY)
Admission: EM | Admit: 2015-12-12 | Discharge: 2015-12-12 | Disposition: A | Discharge status: Home / Self Care | Payer: Medicare Other | Attending: Emergency Medicine | Admitting: Emergency Medicine

## 2015-12-12 DIAGNOSIS — Z7984 Long term (current) use of oral hypoglycemic drugs: Secondary | ICD-10-CM | POA: Insufficient documentation

## 2015-12-12 DIAGNOSIS — E039 Hypothyroidism, unspecified: Secondary | ICD-10-CM | POA: Insufficient documentation

## 2015-12-12 DIAGNOSIS — I252 Old myocardial infarction: Secondary | ICD-10-CM | POA: Diagnosis not present

## 2015-12-12 DIAGNOSIS — E1165 Type 2 diabetes mellitus with hyperglycemia: Secondary | ICD-10-CM | POA: Diagnosis not present

## 2015-12-12 DIAGNOSIS — Z7982 Long term (current) use of aspirin: Secondary | ICD-10-CM | POA: Diagnosis not present

## 2015-12-12 DIAGNOSIS — Z79899 Other long term (current) drug therapy: Secondary | ICD-10-CM | POA: Insufficient documentation

## 2015-12-12 DIAGNOSIS — I10 Essential (primary) hypertension: Secondary | ICD-10-CM | POA: Insufficient documentation

## 2015-12-12 DIAGNOSIS — R739 Hyperglycemia, unspecified: Secondary | ICD-10-CM

## 2015-12-12 LAB — BASIC METABOLIC PANEL
Anion gap: 8 (ref 5–15)
BUN: 22 mg/dL — ABNORMAL HIGH (ref 6–20)
CO2: 22 mmol/L (ref 22–32)
Calcium: 9.2 mg/dL (ref 8.9–10.3)
Chloride: 106 mmol/L (ref 101–111)
Creatinine, Ser: 1.3 mg/dL — ABNORMAL HIGH (ref 0.44–1.00)
GFR calc Af Amer: 49 mL/min — ABNORMAL LOW (ref >60)
GFR calc non Af Amer: 42 mL/min — ABNORMAL LOW (ref >60)
Glucose, Bld: 119 mg/dL — ABNORMAL HIGH (ref 65–99)
Potassium: 3.9 mmol/L (ref 3.5–5.1)
Sodium: 136 mmol/L (ref 135–145)

## 2015-12-12 LAB — URINE MICROSCOPIC-ADD ON

## 2015-12-12 LAB — CBC
HCT: 37 % (ref 36.0–46.0)
Hemoglobin: 12.8 g/dL (ref 12.0–15.0)
MCH: 28.3 pg (ref 26.0–34.0)
MCHC: 34.6 g/dL (ref 30.0–36.0)
MCV: 81.7 fL (ref 78.0–100.0)
Platelets: 311 10*3/uL (ref 150–400)
RBC: 4.53 MIL/uL (ref 3.87–5.11)
RDW: 13.9 % (ref 11.5–15.5)
WBC: 9 10*3/uL (ref 4.0–10.5)

## 2015-12-12 LAB — URINALYSIS, ROUTINE W REFLEX MICROSCOPIC
Bilirubin Urine: NEGATIVE
Glucose, UA: >1000 mg/dL — AB
Ketones, ur: NEGATIVE mg/dL
Leukocytes, UA: NEGATIVE
Nitrite: NEGATIVE
Protein, ur: 30 mg/dL — AB
Specific Gravity, Urine: 1.034 — ABNORMAL HIGH (ref 1.005–1.030)
pH: 5.5 (ref 5.0–8.0)

## 2015-12-12 LAB — CBG MONITORING, ED: Glucose-Capillary: 139 mg/dL — ABNORMAL HIGH (ref 65–99)

## 2015-12-12 MED ORDER — SODIUM CHLORIDE 0.9 % IV BOLUS (SEPSIS)
1000.0000 mL | Freq: Once | INTRAVENOUS | Status: AC
  Administered 2015-12-12: 1000 mL via INTRAVENOUS

## 2015-12-12 NOTE — ED Notes (Signed)
Per EMS- took blood sugar at 2100, said her blood sugar was over 600. Took 30 units of relative's insulin shortly after. Last cbg of 234 by EMS. Complaining of full body muscle cramping.

## 2015-12-12 NOTE — ED Notes (Signed)
Bed: WA08 Expected date:  Expected time:  Means of arrival:  Comments: EMS hyperglycemia 

## 2015-12-12 NOTE — Discharge Instructions (Signed)
Please read and follow all provided instructions.  Your diagnoses today include:  1. Hyperglycemia     Tests performed today include: Vital signs. See below for your results today.   Medications prescribed:  Take as prescribed   Home care instructions:  Follow any educational materials contained in this packet.  Follow-up instructions: Please follow-up with your primary care provider for further evaluation of symptoms and treatment   Return instructions:  Please return to the Emergency Department if you do not get better, if you get worse, or new symptoms OR  - Fever (temperature greater than 101.28F)  - Bleeding that does not stop with holding pressure to the area    -Severe pain (please note that you may be more sore the day after your accident)  - Chest Pain  - Difficulty breathing  - Severe nausea or vomiting  - Inability to tolerate food and liquids  - Passing out  - Skin becoming red around your wounds  - Change in mental status (confusion or lethargy)  - New numbness or weakness    Please return if you have any other emergent concerns.  Additional Information:  Your vital signs today were: BP 114/76    Pulse 100    Resp 18    Ht 5\' 4"  (1.626 m)    Wt 72.6 kg    SpO2 100%    BMI 27.46 kg/m  If your blood pressure (BP) was elevated above 135/85 this visit, please have this repeated by your doctor within one month. ---------------

## 2015-12-12 NOTE — ED Provider Notes (Signed)
[name] DEPT Provider Note   CSN: [id number] Arrival date & time: 12/12/15  0025  By signing my name below, I, Gwenlyn Fudge, attest that this documentation has been prepared under the direction and in the presence of Shary Decamp, PA-C. Electronically Signed: Gwenlyn Fudge, ED Scribe. 12/12/15. 12:34 AM.  History   Chief Complaint Chief Complaint  Patient presents with  . Hyperglycemia   The history is provided by the patient. No language interpreter was used.   HPI Comments: [name] is a 65 y.o. female with PMHx of DM, DVT, GERD, NSTEMI, HTN, and Kidney disease who presents to the Emergency Department via EMS complaining of gradual onset hyperglycemia onset 9 PM. Pt took her blood sugar at 8:30 PM tonight at home and it was over 600. She took 15 units of insulin and measured again 30 minutes later and states her blood sugar was still over 600. She took another 15 units of insulin, but started to experience severe lower leg cramping and hand cramping. She has minor leg cramping at baseline, but has never experienced leg cramping as severe as tonight. Leg and hand cramping have slowly resolved and she reports she is in no current distress.Denies nausea, vomiting, abdminal pain and fever. Pt has never been in DKA before.   Past Medical History:  Diagnosis Date  . Clotting disorder ([place])   . Constipation    history of  . Diabetes mellitus without complication ([place])   . DVT (deep venous thrombosis) ([place])    2007  . Factor V Leiden ([place])    deficiency   . GERD (gastroesophageal reflux disease)   . Hypertension   . Hypothyroidism   . Kidney disease    early stage  . NSTEMI (non-ST elevated myocardial infarction) ([place])    2011 - possibly r/t DVT/embolization?   . Sleep apnea    no c-pap    Patient Active Problem List   Diagnosis Date Noted  . COUGH 11/27/2007  . PHLEBITIS, SUPERFICIAL VEINS, UPPER LIMB 09/29/2006  . VENOUS INSUFFICIENCY, CHRONIC, RIGHT LEG  07/30/2006  . HYPOTHYROIDISM, UNSPECIFIED 04/17/2006  . HYPERTENSION, BENIGN SYSTEMIC 04/17/2006  . DEEP VEIN THROMBOPHLEBITIS, LEG 04/17/2006    Past Surgical History:  Procedure Laterality Date  . CARDIAC CATHETERIZATION  10/21/2009   mild bridging(?) segment in mid LAD, otherwise normal coronaries (Dr. Alma Friendly)  . COLONOSCOPY    . INGUINAL HERNIA REPAIR     when 19 months old  . LOWER EXTREMITY ARTERIAL DOPPLER  2011   normal LEA  . SLEEP STUDY  2011   AHI 8.5 and REM AHI 18  . TRANSTHORACIC ECHOCARDIOGRAM  2011   EF 55-60%, trivial MR, TR, pulm valve regurg  . TUBAL LIGATION  1995  . UPPER GASTROINTESTINAL ENDOSCOPY      OB History    No data available       Home Medications    Prior to Admission medications   Medication Sig Start Date End Date Taking? Authorizing Provider  Ascorbic Acid (VITAMIN C) 1000 MG tablet Take 1,000 mg by mouth daily.    Historical Provider, MD  aspirin 81 MG tablet Take 81 mg by mouth daily.    Historical Provider, MD  Cholecalciferol (VITAMIN D-3) 1000 UNITS CAPS Take by mouth daily.    Historical Provider, MD  co-enzyme Q-10 30 MG capsule Take 100 mg by mouth as needed.     Historical Provider, MD  levothyroxine (SYNTHROID, LEVOTHROID) 200 MCG tablet Take 200 mcg by mouth daily.  Historical Provider, MD  losartan (COZAAR) 50 MG tablet TAKE 1 TABLET BY MOUTH EVERY DAY 11/11/13   Pixie Casino, MD  Magnesium 200 MG TABS Take by mouth daily. Reported on 05/19/2015    Historical Provider, MD  Melatonin 1 MG TABS Take by mouth as needed.    Historical Provider, MD  metFORMIN (GLUCOPHAGE) 1000 MG tablet Take 1,000 mg by mouth 1 day or 1 dose.    Historical Provider, MD  metoprolol succinate (TOPROL-XL) 25 MG 24 hr tablet TAKE 1 TABLET BY MOUTH EVERY DAY 11/11/13   Pixie Casino, MD  Multiple Vitamin (MULTIVITAMIN) tablet Take 1 tablet by mouth daily.    Historical Provider, MD  Multiple Vitamin (STRESS FORMULA 500/BIOTIN PO) Take by mouth daily.  Reported on 05/19/2015    Historical Provider, MD  Omega-3 Fatty Acids (FISH OIL) 300 MG CAPS Take by mouth daily. Reported on 05/19/2015    Historical Provider, MD  ondansetron (ZOFRAN ODT) 4 MG disintegrating tablet Take 1 tablet (4 mg total) by mouth every 8 (eight) hours as needed for nausea or vomiting. 05/19/15   Posey Boyer, MD  polyethylene glycol powder (GLYCOLAX/MIRALAX) powder Take 15 g by mouth as needed.  10/17/13   Historical Provider, MD  Potassium Gluconate 595 MG TBCR Take by mouth daily. Reported on 05/19/2015    Historical Provider, MD  PREVIDENT 5000 BOOSTER PLUS 1.1 % PSTE Take 1 capsule by mouth as needed. Reported on 05/19/2015 11/08/13   Historical Provider, MD  pyridOXINE (VITAMIN B-6) 100 MG tablet Take 100 mg by mouth daily. Reported on 05/19/2015    Historical Provider, MD  terbinafine (LAMISIL) 250 MG tablet Take 1 tablet (250 mg total) by mouth daily. Patient not taking: Reported on 05/19/2015 01/16/15   Roselee Culver, MD  triamcinolone cream (KENALOG) 0.1 % Apply 1 application topically 2 (two) times daily. 01/16/15   Roselee Culver, MD  vitamin B-12 (CYANOCOBALAMIN) 1000 MCG tablet Take 1,000 mcg by mouth daily. Reported on 05/19/2015    Historical Provider, MD  zolpidem (AMBIEN CR) 6.25 MG CR tablet Take 6.25 mg by mouth at bedtime as needed for sleep.    Historical Provider, MD    Family History Family History  Problem Relation Age of Onset  . Colon cancer Neg Hx   . Lung cancer Father   . Cancer Father   . Diabetes Father   . Other Mother     childbirth - enlarged heart  . Cancer Paternal Grandmother   . Diabetes Paternal Grandmother   . Mental illness Paternal Grandmother   . Hypertension Paternal Grandfather     Social History Social History  Substance Use Topics  . Smoking status: Never Smoker  . Smokeless tobacco: Never Used  . Alcohol use No     Comment: 2 glasses wine a month     Allergies   Review of patient's allergies indicates no  known allergies.   Review of Systems Review of Systems A complete 10 system review of systems was obtained and all systems are negative except as noted in the HPI and PMH.    Physical Exam Updated Vital Signs BP 114/76   Pulse 100   Resp 18   Ht 5\' 4"  (1.626 m)   Wt 72.6 kg   SpO2 100%   BMI 27.46 kg/m   Physical Exam  Constitutional: She is oriented to person, place, and time. Vital signs are normal. She appears well-developed and well-nourished. She is active. No distress.  No acute distress, sitting comfortably  HENT:  Head: Normocephalic and atraumatic.  Right Ear: Hearing normal.  Left Ear: Hearing normal.  Eyes: Conjunctivae and EOM are normal. Pupils are equal, round, and reactive to light.  Neck: Normal range of motion. Neck supple.  Cardiovascular: Normal rate, regular rhythm, normal heart sounds and intact distal pulses.   Pulmonary/Chest: Effort normal and breath sounds normal. No respiratory distress. She has no wheezes. She has no rales.  Abdominal: Soft. Bowel sounds are normal. There is no tenderness.  Musculoskeletal: Normal range of motion.  Neurological: She is alert and oriented to person, place, and time.  Skin: Skin is warm and dry.  Psychiatric: She has a normal mood and affect. Her speech is normal and behavior is normal. Thought content normal.  Nursing note and vitals reviewed.  ED Treatments / Results  DIAGNOSTIC STUDIES: Oxygen Saturation is 97% on RA, adequate by my interpretation.    COORDINATION OF CARE: 12:53 AM Discussed treatment plan with pt at bedside which includes fluids and pt agreed to plan.  Labs (all labs ordered are listed, but only abnormal results are displayed) Labs Reviewed  URINALYSIS, ROUTINE W REFLEX MICROSCOPIC (NOT AT [facility]) - Abnormal; Notable for the following:       Result Value   Specific Gravity, Urine 1.034 (*)    Glucose, UA >1000 (*)    Hgb urine dipstick TRACE (*)    Protein, ur 30 (*)    All other  components within normal limits  BASIC METABOLIC PANEL - Abnormal; Notable for the following:    Glucose, Bld 119 (*)    BUN 22 (*)    Creatinine, Ser 1.30 (*)    GFR calc non Af Amer 42 (*)    GFR calc Af Amer 49 (*)    All other components within normal limits  URINE MICROSCOPIC-ADD ON - Abnormal; Notable for the following:    Squamous Epithelial / LPF 0-5 (*)    Bacteria, UA RARE (*)    All other components within normal limits  CBG MONITORING, ED - Abnormal; Notable for the following:    Glucose-Capillary 139 (*)    All other components within normal limits  CBC   EKG  EKG Interpretation None      Radiology No results found.  Procedures Procedures (including critical care time)  Medications Ordered in ED Medications - No data to display   Initial Impression / Assessment and Plan / ED Course  I have reviewed the triage vital signs and the nursing notes.  Pertinent labs & imaging results that were available during my care of the patient were reviewed by me and considered in my medical decision making (see chart for details).  Clinical Course   Final Clinical Impressions(s) / ED Diagnoses   I have reviewed and evaluated the relevant laboratory values I have reviewed and evaluated the relevant imaging studies.  I have reviewed the relevant previous healthcare records.I obtained HPI from historian.  ED Course:  Assessment: Pt is a 65yF with hx DM2 who presents with hyperglycemia several hours ago when checking at home. Noted >600 on home monitor. Took insulin before arrival. On exam, pt in NAD. Nontoxic/nonseptic appearing. VSS. Afebrile. Lungs CTA. Heart RRR. Abdomen nontender soft. POC Glucose 139. CBC unremarkable. Potassium 3.9. Gap 8. UA without ketones. Given NS bolus 1L in ED. No evidence of DKA. Plan is to [place] with follow up to PCP. At time of discharge, Patient is in no acute distress. Vital  Signs are stable. Patient is able to ambulate. Patient able to  tolerate PO.    Disposition/Plan:  DC Home Additional Verbal discharge instructions given and discussed with patient.  Pt Instructed to f/u with PCP in the next week for evaluation and treatment of symptoms. Return precautions given Pt acknowledges and agrees with plan  Supervising Physician Merryl Hacker, MD  I personally performed the services described in this documentation, which was scribed in my presence. The recorded information has been reviewed and is accurate.   Final diagnoses:  Hyperglycemia    New Prescriptions New Prescriptions   No medications on file     Shary Decamp, PA-C 12/12/15 [id number]    Merryl Hacker, MD 12/13/15 [phone]

## 2016-07-01 ENCOUNTER — Ambulatory Visit (INDEPENDENT_AMBULATORY_CARE_PROVIDER_SITE_OTHER): Payer: Medicare Other | Admitting: Internal Medicine

## 2016-07-01 ENCOUNTER — Encounter: Payer: Self-pay | Admitting: Internal Medicine

## 2016-07-01 VITALS — BP 146/82 | HR 71 | Ht 64.0 in | Wt 171.0 lb

## 2016-07-01 DIAGNOSIS — I10 Essential (primary) hypertension: Secondary | ICD-10-CM | POA: Diagnosis not present

## 2016-07-01 DIAGNOSIS — Z13220 Encounter for screening for lipoid disorders: Secondary | ICD-10-CM | POA: Diagnosis not present

## 2016-07-01 DIAGNOSIS — I252 Old myocardial infarction: Secondary | ICD-10-CM | POA: Diagnosis not present

## 2016-07-01 DIAGNOSIS — E119 Type 2 diabetes mellitus without complications: Secondary | ICD-10-CM | POA: Insufficient documentation

## 2016-07-01 DIAGNOSIS — Z794 Long term (current) use of insulin: Secondary | ICD-10-CM

## 2016-07-01 NOTE — Progress Notes (Signed)
OFFICE NOTE  Chief Complaint:  No complaints - did not take BP meds today  Primary Care Physician: Velna Hatchet, MD  HPI:  [name] is a 66 year old female with history of non-STEMI, thought to be due to DVT and embolization. She may have had a small PE at the time, too, but was not diagnosed. 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 She also has sleep apnea, on CPAP, has done well, but has stopped using it, however, because she did not feel it helped her. Last year she was in [place], reported to have had some chest pains, which were thought to be musculoskeletal related to her Lipitor, and they recommended discontinuing that. Overall, she has done fairly well and has had no further problems with palpitations or any chest pain.   Today she returns for followup. She reports that sometime in the past year she had an episode of bleeding after she cut her finger which took more than 10 minutes to stop. This was very scary for her and she decided to discontinue taking warfarin. She said was not related to cost or the convenience of getting her INRs checked. She does understand that she has a factor V Leiden deficiency and even though her DVT/PE was provoked the injury, she is at increased risk for recurrent DVT or PE. Aspirin may only provide minimal benefit.  She was also commended and the fact that she's lost over 20 pounds. She's been active and has been exercising 4 days a week.  07/01/2016  Mrs. [name] returns for follow-up today. It's been about 2 years since I last saw her. She's been variably compliant with her medications. She says she did not take her blood pressure medicines today and blood pressure was elevated. She's not taking aspirin regularly either. She took herself off of warfarin and is at risk for future clots due to history of factor V Leiden deficiency. Recently she presented the ER  was found to be hyperglycemic and new diagnosis of diabetes. She is working on dietary changes to help with that. She's now on metformin. I reiterated the fact that she should be on low-dose aspirin as diabetes is a coronary artery disease risk equivalent. She'll also need lipid profile prior to her follow-up in 6 months.  PMHx:  Past Medical History:  Diagnosis Date  . Clotting disorder ([place])   . Constipation    history of  . Diabetes mellitus without complication ([place])   . DVT (deep venous thrombosis) ([place])    2007  . Factor V Leiden ([place])    deficiency   . GERD (gastroesophageal reflux disease)   . Hypertension   . Hypothyroidism   . Kidney disease    early stage  . NSTEMI (non-ST elevated myocardial infarction) ([place])    2011 - possibly r/t DVT/embolization?   . Sleep apnea    no c-pap    Past Surgical History:  Procedure Laterality Date  . CARDIAC CATHETERIZATION  10/21/2009   mild bridging(?) segment in mid LAD, otherwise normal coronaries (Dr. Alma Friendly)  . COLONOSCOPY    . INGUINAL HERNIA REPAIR     when 29 months old  . LOWER EXTREMITY ARTERIAL DOPPLER  2011   normal LEA  . SLEEP STUDY  2011   AHI 8.5 and REM AHI 18  . TRANSTHORACIC ECHOCARDIOGRAM  2011   EF 55-60%, trivial MR, TR, pulm  valve regurg  . TUBAL LIGATION  1995  . UPPER GASTROINTESTINAL ENDOSCOPY      FAMHx:  Family History  Problem Relation Age of Onset  . Lung cancer Father   . Cancer Father   . Diabetes Father   . Other Mother        childbirth - enlarged heart  . Cancer Paternal Grandmother   . Diabetes Paternal Grandmother   . Mental illness Paternal Grandmother   . Hypertension Paternal Grandfather   . Colon cancer Neg Hx     SOCHx:   reports that she has never smoked. She has never used smokeless tobacco. She reports that she does not drink alcohol or use drugs.  ALLERGIES:  No Known Allergies  ROS: Pertinent items noted in HPI and remainder of comprehensive ROS otherwise  negative.  HOME MEDS: Current Outpatient Prescriptions  Medication Sig Dispense Refill  . aspirin EC 81 MG tablet Take 81 mg by mouth daily.    Marland Kitchen losartan (COZAAR) 50 MG tablet TAKE 1 TABLET BY MOUTH EVERY DAY 90 tablet 3  . metFORMIN (GLUCOPHAGE-XR) 500 MG 24 hr tablet Take 1,000 mg by mouth 2 (two) times daily.    . metoprolol succinate (TOPROL-XL) 25 MG 24 hr tablet TAKE 1 TABLET BY MOUTH EVERY DAY 90 tablet 3   No current facility-administered medications for this visit.     LABS/IMAGING: No results found for this or any previous visit (from the past 48 hour(s)). No results found.  VITALS: BP (!) 146/82   Pulse 71   Ht 5\' 4"  (1.626 m)   Wt 171 lb (77.6 kg)   BMI 29.35 kg/m   EXAM: General appearance: alert and no distress Neck: no carotid bruit and no JVD Lungs: clear to auscultation bilaterally Heart: regular rate and rhythm, S1, S2 normal, no murmur, click, rub or gallop Abdomen: soft, non-tender; bowel sounds normal; no masses,  no organomegaly Extremities: extremities normal, atraumatic, no cyanosis or edema Pulses: 2+ and symmetric Skin: Skin color, texture, turgor normal. No rashes or lesions Neurologic: Grossly normal Psych: Pleasant  EKG: NSR at 71  ASSESSMENT: 1. History of NSTEMI secondary to thrombosis 2. History of DVT-on warfarin however self discontinued 3. GERD 4. Possible factor V Leiden deficiency 5. HTN - controlled 6. DM2  PLAN: 1.   [name] is variably compliant with her medications. She did not take her blood pressure medicines today and blood pressure is mildly elevated. She is also not taking daily aspirin which is strongly recommended given her history of possible factor V Leiden deficiency, prior DVT and thrombosis events. She does need to be on aspirin daily. She's also newly diagnosed diabetic. She'll need a lipid profile as well -she is not currently on statin therapy. Follow-up with me in 6 months or sooner as necessary.  Pixie Casino, MD, [facility] Attending Cardiologist [place] 07/01/2016, 1:54 PM

## 2016-07-01 NOTE — Patient Instructions (Signed)
Your physician has recommended you make the following change in your medication:  -- START aspirin 81mg  once daily -- Your physician recommends that you continue on your current medications as directed. Please refer to the Current Medication list given to you today.  Your physician wants you to follow-up in: 6 months with Dr. Debara Pickett. You will receive a reminder letter in the mail two months in advance. If you don't receive a letter, please call our office to schedule the follow-up appointment.

## 2016-08-08 ENCOUNTER — Telehealth: Payer: Self-pay | Admitting: Internal Medicine

## 2016-08-08 NOTE — Telephone Encounter (Signed)
New message       Pt stopped taking warfarin years ago.  She is wondering if she needs to be taking a blood thinner?  If yes, she is wondering about plavix.  Please call

## 2016-08-08 NOTE — Telephone Encounter (Signed)
S/w Dr Debara Pickett he states that pt does not need to take Plavix or Warfarin. Have pt just continue on the ASA.  Pt notified she will continue ASA

## 2017-06-10 ENCOUNTER — Encounter (HOSPITAL_COMMUNITY): Payer: Self-pay | Admitting: Emergency Medicine

## 2017-06-10 ENCOUNTER — Emergency Department (HOSPITAL_COMMUNITY): Payer: Medicare Other

## 2017-06-10 ENCOUNTER — Other Ambulatory Visit: Payer: Self-pay

## 2017-06-10 ENCOUNTER — Emergency Department (HOSPITAL_COMMUNITY)
Admission: EM | Admit: 2017-06-10 | Discharge: 2017-06-11 | Disposition: A | Discharge status: Home / Self Care | Payer: Medicare Other | Attending: Emergency Medicine | Admitting: Emergency Medicine

## 2017-06-10 DIAGNOSIS — Z79899 Other long term (current) drug therapy: Secondary | ICD-10-CM | POA: Insufficient documentation

## 2017-06-10 DIAGNOSIS — Z7984 Long term (current) use of oral hypoglycemic drugs: Secondary | ICD-10-CM | POA: Insufficient documentation

## 2017-06-10 DIAGNOSIS — Z7982 Long term (current) use of aspirin: Secondary | ICD-10-CM | POA: Diagnosis not present

## 2017-06-10 DIAGNOSIS — I10 Essential (primary) hypertension: Secondary | ICD-10-CM | POA: Diagnosis not present

## 2017-06-10 DIAGNOSIS — E039 Hypothyroidism, unspecified: Secondary | ICD-10-CM | POA: Insufficient documentation

## 2017-06-10 DIAGNOSIS — E119 Type 2 diabetes mellitus without complications: Secondary | ICD-10-CM | POA: Insufficient documentation

## 2017-06-10 DIAGNOSIS — I252 Old myocardial infarction: Secondary | ICD-10-CM | POA: Insufficient documentation

## 2017-06-10 DIAGNOSIS — R079 Chest pain, unspecified: Secondary | ICD-10-CM | POA: Insufficient documentation

## 2017-06-10 LAB — BASIC METABOLIC PANEL
Anion gap: 12 (ref 5–15)
BUN: 18 mg/dL (ref 6–20)
CO2: 21 mmol/L — ABNORMAL LOW (ref 22–32)
Calcium: 9.3 mg/dL (ref 8.9–10.3)
Chloride: 102 mmol/L (ref 101–111)
Creatinine, Ser: 1.2 mg/dL — ABNORMAL HIGH (ref 0.44–1.00)
GFR calc Af Amer: 53 mL/min — ABNORMAL LOW (ref >60)
GFR calc non Af Amer: 46 mL/min — ABNORMAL LOW (ref >60)
Glucose, Bld: 292 mg/dL — ABNORMAL HIGH (ref 65–99)
Potassium: 4.3 mmol/L (ref 3.5–5.1)
Sodium: 135 mmol/L (ref 135–145)

## 2017-06-10 LAB — I-STAT TROPONIN, ED: Troponin i, poc: 0 ng/mL (ref 0.00–0.08)

## 2017-06-10 LAB — CBC
HCT: 37.4 % (ref 36.0–46.0)
Hemoglobin: 12.6 g/dL (ref 12.0–15.0)
MCH: 28.9 pg (ref 26.0–34.0)
MCHC: 33.7 g/dL (ref 30.0–36.0)
MCV: 85.8 fL (ref 78.0–100.0)
Platelets: 278 10*3/uL (ref 150–400)
RBC: 4.36 MIL/uL (ref 3.87–5.11)
RDW: 14.2 % (ref 11.5–15.5)
WBC: 6.6 10*3/uL (ref 4.0–10.5)

## 2017-06-10 MED ORDER — IBUPROFEN 800 MG PO TABS
800.0000 mg | ORAL_TABLET | Freq: Once | ORAL | Status: AC
  Administered 2017-06-11: 800 mg via ORAL
  Filled 2017-06-10: qty 1

## 2017-06-10 NOTE — ED Provider Notes (Signed)
[facility] DEPT Provider Note   CSN: [id number] Arrival date & time: 06/10/17  1842     History   Chief Complaint Chief Complaint  Patient presents with  . Chest Pain    HPI [name] is a 67 y.o. female.  The history is provided by the patient and medical records.  Chest Pain      67 y.o. F with hx of factor V Leiden, DM, hx of DVT and PE, HTN, hypothyroidism, sleep apnea,  NSTEMI, presenting to the ED with chest pain.  Patient states this starting this morning around 0530-- mostly localized to left posterior neck, shoulder blade, and posterior left arm.  Some pain in the chest.  States she took some tylenol and got better but returned again around 1530.  Went to PCP office and was sent here for further evaluation.  States pain currently is mild, more like a discomfort.  Denies SOB, pain with breathing, diaphoresis, nausea, or vomiting.  States her chest discomfort felt like indigestion.  She does have hx of GERD. She has had prior NSTEMI in setting of DVT/PE.  No other cardiac history.  She currently takes daily ASA.  Patient states her pain feels like it is in the muscle of her arm.  She has been injecting her insulin in her arm intermittently.    Past Medical History:  Diagnosis Date  . Clotting disorder ([place])   . Constipation    history of  . Diabetes mellitus without complication ([place])   . DVT (deep venous thrombosis) ([place])    2007  . Factor V Leiden ([place])    deficiency   . GERD (gastroesophageal reflux disease)   . Hypertension   . Hypothyroidism   . Kidney disease    early stage  . NSTEMI (non-ST elevated myocardial infarction) ([place])    2011 - possibly r/t DVT/embolization?   . Sleep apnea    no c-pap    Patient Active Problem List   Diagnosis Date Noted  . Type 2 diabetes mellitus without complication, without long-term current use of insulin ([place]) 07/01/2016  . Hx of non-ST elevation myocardial infarction (NSTEMI)  07/01/2016  . Screening for lipid disorders 07/01/2016  . COUGH 11/27/2007  . PHLEBITIS, SUPERFICIAL VEINS, UPPER LIMB 09/29/2006  . VENOUS INSUFFICIENCY, CHRONIC, RIGHT LEG 07/30/2006  . HYPOTHYROIDISM, UNSPECIFIED 04/17/2006  . Essential hypertension 04/17/2006  . DEEP VEIN THROMBOPHLEBITIS, LEG 04/17/2006    Past Surgical History:  Procedure Laterality Date  . CARDIAC CATHETERIZATION  10/21/2009   mild bridging(?) segment in mid LAD, otherwise normal coronaries (Dr. Alma Friendly)  . COLONOSCOPY    . INGUINAL HERNIA REPAIR     when 70 months old  . LOWER EXTREMITY ARTERIAL DOPPLER  2011   normal LEA  . SLEEP STUDY  2011   AHI 8.5 and REM AHI 18  . TRANSTHORACIC ECHOCARDIOGRAM  2011   EF 55-60%, trivial MR, TR, pulm valve regurg  . TUBAL LIGATION  1995  . UPPER GASTROINTESTINAL ENDOSCOPY       OB History   None      Home Medications    Prior to Admission medications   Medication Sig Start Date End Date Taking? Authorizing Provider  aspirin EC 81 MG tablet Take 81 mg by mouth daily.    [provider]  insulin lispro (HUMALOG) 100 UNIT/ML injection Inject 3 Units into the skin as needed for high blood sugar.    [provider]  losartan (COZAAR) 50  MG tablet TAKE 1 TABLET BY MOUTH EVERY DAY 11/11/13   Hilty, Nadean Corwin, MD  metFORMIN (GLUCOPHAGE-XR) 500 MG 24 hr tablet Take 1,000 mg by mouth 2 (two) times daily.    [provider]  metoprolol succinate (TOPROL-XL) 25 MG 24 hr tablet TAKE 1 TABLET BY MOUTH EVERY DAY 11/11/13   Hilty, Nadean Corwin, MD    Family History Family History  Problem Relation Age of Onset  . Lung cancer Father   . Cancer Father   . Diabetes Father   . Other Mother        childbirth - enlarged heart  . Cancer Paternal Grandmother   . Diabetes Paternal Grandmother   . Mental illness Paternal Grandmother   . Hypertension Paternal Grandfather   . Colon cancer Neg Hx     Social History Social History   Tobacco Use  .  Smoking status: Never Smoker  . Smokeless tobacco: Never Used  Substance Use Topics  . Alcohol use: Yes    Alcohol/week: 0.0 oz    Comment: wine  . Drug use: No     Allergies   Patient has no known allergies.   Review of Systems Review of Systems  Cardiovascular: Positive for chest pain.  All other systems reviewed and are negative.    Physical Exam Updated Vital Signs BP (!) 178/86 (BP Location: Left Arm)   Pulse 73   Temp 98.2 F (36.8 C) (Oral)   Resp 16   Ht 5\' 4"  (1.626 m)   Wt 77.1 kg (170 lb)   SpO2 100%   BMI 29.18 kg/m   Physical Exam  Constitutional: She is oriented to person, place, and time. She appears well-developed and well-nourished.  HENT:  Head: Normocephalic and atraumatic.  Mouth/Throat: Oropharynx is clear and moist.  Eyes: Pupils are equal, round, and reactive to light. Conjunctivae and EOM are normal.  Neck: Normal range of motion.  Cardiovascular: Normal rate, regular rhythm and normal heart sounds.  Pulmonary/Chest: Effort normal and breath sounds normal. She has no decreased breath sounds. She has no wheezes. She has no rales.  Abdominal: Soft. Bowel sounds are normal.  Musculoskeletal: Normal range of motion.  Some mild tenderness of left posterior shoulder musculature along trapezius, no noted spasm or deformity; full ROM of left shoulder; endorses pain along left upper arm, however no swelling, warmth to touch, palpable cords, or overlying skin changes; arm is NVI No leg edema, calf swelling, or tenderness  Neurological: She is alert and oriented to person, place, and time.  Skin: Skin is warm and dry.  Psychiatric: She has a normal mood and affect.  Nursing note and vitals reviewed.    ED Treatments / Results  Labs (all labs ordered are listed, but only abnormal results are displayed) Labs Reviewed  BASIC METABOLIC PANEL - Abnormal; Notable for the following components:      Result Value   CO2 21 (*)    Glucose, Bld 292 (*)      Creatinine, Ser 1.20 (*)    GFR calc non Af Amer 46 (*)    GFR calc Af Amer 53 (*)    All other components within normal limits  CBC  I-STAT TROPONIN, ED  I-STAT TROPONIN, ED    EKG EKG Interpretation  Date/Time:  Tuesday June 10 2017 19:29:54 EDT Ventricular Rate:  74 PR Interval:    QRS Duration: 98 QT Interval:  437 QTC Calculation: 485 R Axis:   59 Text Interpretation:  Sinus rhythm  Borderline repolarization abnormality No acute changes Nonspecific ST and T wave abnormality No significant change since last tracing Confirmed by Varney Biles [phone]) on 06/11/2017 12:26:06 AM   Radiology Dg Chest 2 View  Result Date: 06/10/2017 CLINICAL DATA:  Chest pain EXAM: CHEST - 2 VIEW COMPARISON:  10/21/2009 FINDINGS: Normal heart size and mediastinal contours. No acute infiltrate or edema. No effusion or pneumothorax. No acute osseous findings. IMPRESSION: Negative chest. Electronically Signed   By: Monte Fantasia M.D.   On: 06/10/2017 19:57    Procedures Procedures (including critical care time)  Medications Ordered in ED Medications - No data to display   Initial Impression / Assessment and Plan / ED Course  I have reviewed the triage vital signs and the nursing notes.  Pertinent labs & imaging results that were available during my care of the patient were reviewed by me and considered in my medical decision making (see chart for details).  67 year old female here with chest pain.  Intermittent throughout the day, mostly seems localized to left shoulder and left arm.  States brief chest pain she had earlier felt like indigestion.  She does inform me that she has been injecting her insulin intermittently in the left arm.  She is afebrile and nontoxic.  Exam is benign aside from some mild tenderness along the left trapezius musculature.  There is no significant spasm or deformity.  No pain with range of motion of the left shoulder.  No signs or symptoms concerning for upper  extremity DVT of the left arm.  EKG unchanged from previous.  Labs overall reassuring.  Chest x-ray is clear.  Patient does have history of factor V Leiden, currently only on aspirin.  She has not had any palpitations, tachycardia, or hypoxia.  She has no signs or symptoms concerning for DVT on exam.  Some of her symptoms may be due to injecting her insulin in her arm.  She does have some cardiac risk factors as well as history of NSTEMI.  Will obtain delta troponin.  Delta troponin is negative.  Low suspicion for ACS, PE, dissection, or other acute cardiac event.  Feel she is stable for discharge.  Will have her follow-up with PCP and her cardiologist.  Discussed plan with patient, she acknowledged understanding and agreed with plan of care.  Return precautions given for new or worsening symptoms.  Patient seen and evaluated with attending physician, Dr. Kathrynn Humble, who agrees with assessment and plan of care.  Final Clinical Impressions(s) / ED Diagnoses   Final diagnoses:  Chest pain in adult    ED Discharge Orders    None       Larene Pickett, PA-C 06/11/17 [id number]    Varney Biles, MD 06/12/17 [phone]

## 2017-06-10 NOTE — ED Triage Notes (Signed)
Pt states this morning she woke up about 0530 with pain in the left side of her neck that went down into her left arm and into her left chest  Pt states she took some tylenol and it eased off around 7am  Pt states it came back about 1530 so she went to her PCP and was told to come here  Pt states she has some numbness to the back of her left arm  Pt states her chest discomfort felt more like indigestion

## 2017-06-11 LAB — I-STAT TROPONIN, ED: Troponin i, poc: 0 ng/mL (ref 0.00–0.08)

## 2017-06-11 NOTE — Discharge Instructions (Signed)
Cardiac tests today looked great. Some of your pain may be musculoskeletal.   I would advise against injecting your insulin in your arm, this may account for some of your arm pain/discomfort. Follow-up with your primary care doctor. Return here for any new/acute changes.

## 2017-07-18 ENCOUNTER — Ambulatory Visit: Payer: Medicare Other | Admitting: Internal Medicine

## 2017-07-18 ENCOUNTER — Encounter: Payer: Self-pay | Admitting: Internal Medicine

## 2017-07-18 VITALS — BP 140/82 | HR 67 | Ht 64.0 in | Wt 180.4 lb

## 2017-07-18 DIAGNOSIS — Z86718 Personal history of other venous thrombosis and embolism: Secondary | ICD-10-CM | POA: Insufficient documentation

## 2017-07-18 DIAGNOSIS — I10 Essential (primary) hypertension: Secondary | ICD-10-CM | POA: Diagnosis not present

## 2017-07-18 DIAGNOSIS — E785 Hyperlipidemia, unspecified: Secondary | ICD-10-CM | POA: Diagnosis not present

## 2017-07-18 DIAGNOSIS — M7989 Other specified soft tissue disorders: Secondary | ICD-10-CM | POA: Diagnosis not present

## 2017-07-18 DIAGNOSIS — M79604 Pain in right leg: Secondary | ICD-10-CM | POA: Insufficient documentation

## 2017-07-18 MED ORDER — EZETIMIBE-SIMVASTATIN 10-40 MG PO TABS: 1.0000 | ORAL_TABLET | Freq: Every day | ORAL | 3 refills | Status: DC

## 2017-07-18 NOTE — Patient Instructions (Addendum)
Your physician has recommended you make the following change in your medication: START vytorin 10/40 daily. This is combination of zetia & simvastatin  Your physician recommends that you return for lab work in 3-4 months (prior to your next appointment with Dr. Debara Pickett).  >> you will need to fast for this blood work   Your physician has requested that you have a lower venous reflux >> this is done at [facility], [place], [place] [address]  Your physician recommends that you schedule a follow-up appointment in 3-4 months with Dr. Debara Pickett.

## 2017-07-18 NOTE — Progress Notes (Signed)
OFFICE NOTE  Chief Complaint:  Right leg pain  Primary Care Physician: Velna Hatchet, MD  HPI:  [name] is a 67 year old female with history of non-STEMI, thought to be due to DVT and embolization. She may have had a small PE at the time, too, but was not diagnosed. 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 She also has sleep apnea, on CPAP, has done well, but has stopped using it, however, because she did not feel it helped her. Last year she was in [place], reported to have had some chest pains, which were thought to be musculoskeletal related to her Lipitor, and they recommended discontinuing that. Overall, she has done fairly well and has had no further problems with palpitations or any chest pain.   Today she returns for followup. She reports that sometime in the past year she had an episode of bleeding after she cut her finger which took more than 10 minutes to stop. This was very scary for her and she decided to discontinue taking warfarin. She said was not related to cost or the convenience of getting her INRs checked. She does understand that she has a factor V Leiden deficiency and even though her DVT/PE was provoked the injury, she is at increased risk for recurrent DVT or PE. Aspirin may only provide minimal benefit.  She was also commended and the fact that she's lost over 20 pounds. She's been active and has been exercising 4 days a week.  07/01/2016  [name] returns for follow-up today. It's been about 2 years since I last saw her. She's been variably compliant with her medications. She says she did not take her blood pressure medicines today and blood pressure was elevated. She's not taking aspirin regularly either. She took herself off of warfarin and is at risk for future clots due to history of factor V Leiden deficiency. Recently she presented the ER was found to be  hyperglycemic and new diagnosis of diabetes. She is working on dietary changes to help with that. She's now on metformin. I reiterated the fact that she should be on low-dose aspirin as diabetes is a coronary artery disease risk equivalent. She'll also need lipid profile prior to her follow-up in 6 months.  07/18/2017  [name] is seen today in follow-up.  She is doing quite well over the last year.  Today she is complaining of some right leg pain she is also had some left arm pain.  She reports lower extremity bruising and some insignificant edema.  She has a history of DVT in the past and is concerned about recurrence.  She is been compliant with aspirin which likely explains her bruising.  She did have lab work which I requested last year and showed total cholesterol 304, HDL 84, LDL 192 and triglycerides 140.  She was supposed to start a atorvastatin however did not.  She is interested in taking simvastatin because her Elenor Legato is on that and it is tolerated.  I did explain this is a moderate potency statin and unlikely to get her to goal LDL less than 70 given her history of non-STEMI.  PMHx:  Past Medical History:  Diagnosis Date  . Clotting disorder ([place])   . Constipation    history of  . Diabetes mellitus without complication ([place])   . DVT (deep venous thrombosis) ([place])    2007  . Factor  V Leiden ([place])    deficiency   . GERD (gastroesophageal reflux disease)   . Hypertension   . Hypothyroidism   . Kidney disease    early stage  . NSTEMI (non-ST elevated myocardial infarction) ([place])    2011 - possibly r/t DVT/embolization?   . Sleep apnea    no c-pap    Past Surgical History:  Procedure Laterality Date  . CARDIAC CATHETERIZATION  10/21/2009   mild bridging(?) segment in mid LAD, otherwise normal coronaries (Dr. Alma Friendly)  . COLONOSCOPY    . INGUINAL HERNIA REPAIR     when 33 months old  . LOWER EXTREMITY ARTERIAL DOPPLER  2011   normal LEA  . SLEEP STUDY  2011   AHI 8.5 and  REM AHI 18  . TRANSTHORACIC ECHOCARDIOGRAM  2011   EF 55-60%, trivial MR, TR, pulm valve regurg  . TUBAL LIGATION  1995  . UPPER GASTROINTESTINAL ENDOSCOPY      FAMHx:  Family History  Problem Relation Age of Onset  . Lung cancer Father   . Cancer Father   . Diabetes Father   . Other Mother        childbirth - enlarged heart  . Cancer Paternal Grandmother   . Diabetes Paternal Grandmother   . Mental illness Paternal Grandmother   . Hypertension Paternal Grandfather   . Colon cancer Neg Hx     SOCHx:   reports that she has never smoked. She has never used smokeless tobacco. She reports that she drinks alcohol. She reports that she does not use drugs.  ALLERGIES:  No Known Allergies  ROS: Pertinent items noted in HPI and remainder of comprehensive ROS otherwise negative.  HOME MEDS: Current Outpatient Medications  Medication Sig Dispense Refill  . acetaminophen (TYLENOL) 500 MG tablet Take 1,000 mg by mouth every 6 (six) hours as needed for mild pain.    Marland Kitchen aspirin EC 81 MG tablet Take 81 mg by mouth daily.    . insulin glargine (LANTUS) 100 UNIT/ML injection Inject 25 Units into the skin at bedtime.    . insulin lispro (HUMALOG) 100 UNIT/ML injection Inject 5-10 Units into the skin 3 (three) times daily with meals.     Marland Kitchen levothyroxine (SYNTHROID, LEVOTHROID) 200 MCG tablet Take 200 mcg by mouth daily.    Marland Kitchen losartan (COZAAR) 50 MG tablet TAKE 1 TABLET BY MOUTH EVERY DAY 90 tablet 3  . metFORMIN (GLUCOPHAGE-XR) 500 MG 24 hr tablet Take 1,000 mg by mouth 2 (two) times daily.    . metoprolol succinate (TOPROL-XL) 25 MG 24 hr tablet TAKE 1 TABLET BY MOUTH EVERY DAY 90 tablet 3   No current facility-administered medications for this visit.     LABS/IMAGING: No results found for this or any previous visit (from the past 48 hour(s)). No results found.  VITALS: BP 140/82   Pulse 67   Ht 5\' 4"  (1.626 m)   Wt 180 lb 6.4 oz (81.8 kg)   BMI 30.97 kg/m   EXAM: General  appearance: alert and no distress Neck: no carotid bruit and no JVD Lungs: clear to auscultation bilaterally Heart: regular rate and rhythm, S1, S2 normal, no murmur, click, rub or gallop Abdomen: soft, non-tender; bowel sounds normal; no masses,  no organomegaly Extremities: extremities normal, atraumatic, no cyanosis or edema, venous stasis dermatitis noted and Mild ecchymosis Pulses: 2+ and symmetric Skin: Skin color, texture, turgor normal. No rashes or lesions Neurologic: Grossly normal Psych: Pleasant  EKG: Normal sinus rhythm at  67-personally reviewed  ASSESSMENT: 1. History of NSTEMI secondary to thrombosis 2. History of DVT-on warfarin however self discontinued 3. GERD 4. Possible factor V Leiden deficiency 5. HTN - controlled 6. DM2 7. Marked dyslipidemia  PLAN: 1.   Ms. Skolnick has marked dyslipidemia but I suspect a very atherogenic diet.  She eats fried chicken least twice a week and other foods high in saturated fats.  She will need to make significant dietary changes and I encouraged her to try at least for a few months to see the impact that has on her lipid profile.  We will also recommend starting simvastatin since this is her suggestion in addition to ezetimibe 40/10 mg daily.  Hopefully this will give her 40 to 50% reduction in her lipid profile with significant dietary changes may be she can get close to what her goals are.  Also because of right leg pain and swelling with some ecchymosis, we will go ahead and check lower extremity venous insufficiency studies.  She has a history of DVT and is at increased risk of venous insufficiency which may be causing her leg pain.  Follow-up with me in 3 to 4 months.  Pixie Casino, MD, [facility], [place] Director of the [facility] Lip[facility]  Card[facility]ovascular [facility] Diplomate of the American Board of Clinical Lipidology Attending Cardiologist  Direct Dial: [phone]   Fax: [phone]  Website:  [email or web address] Twilia Yaklin 07/18/2017, 2:45 PM

## 2017-07-23 ENCOUNTER — Ambulatory Visit (HOSPITAL_COMMUNITY)
Admission: RE | Admit: 2017-07-23 | Discharge: 2017-07-23 | Disposition: A | Discharge status: Home / Self Care | Payer: Medicare Other | Source: Ambulatory Visit | Attending: Family | Admitting: Family

## 2017-07-23 DIAGNOSIS — M79604 Pain in right leg: Secondary | ICD-10-CM | POA: Insufficient documentation

## 2017-07-23 DIAGNOSIS — M7989 Other specified soft tissue disorders: Secondary | ICD-10-CM | POA: Diagnosis not present

## 2017-07-23 DIAGNOSIS — Z86718 Personal history of other venous thrombosis and embolism: Secondary | ICD-10-CM | POA: Insufficient documentation

## 2017-07-29 ENCOUNTER — Telehealth: Payer: Self-pay | Admitting: Internal Medicine

## 2017-07-29 NOTE — Telephone Encounter (Signed)
New Message    Patient is returning call in reference to her vascular study results. Please call.

## 2017-07-29 NOTE — Telephone Encounter (Signed)
Notes recorded by Pixie Casino, MD on 07/25/2017 at 3:39 PM EDT No DVT or deep/superficial reflux bilaterally.  Dr. Lemmie Evens  PT NOTIFIED

## 2017-09-04 ENCOUNTER — Telehealth: Payer: Self-pay | Admitting: Internal Medicine

## 2017-09-04 NOTE — Telephone Encounter (Addendum)
Called patient back. Patient complaining of pain in her palm of her hand and tingling in her arm. Patient denies any chest pain or SOB. Patient through she slept on her arm wrong, but it has not gotten any better. Patient stated she called her PCP and they suggested going to ED. Patient stated she does not want to go to ED, that it's too expensive, and she would like to see Dr. Debara Pickett. Informed patient that she needs to see someone (like her PCP) due to the pain, and there are no available appointments the rest of this week for our offices. Informed patient that this does not sound heart related. Encouraged patient to go to Urgent Care as a cheaper option and they could direct her to ED if needed. Patient agreed to plan and will go to Urgent Care.

## 2017-09-04 NOTE — Telephone Encounter (Addendum)
New message   Patient calling with concerns of arm and hand pain. Patient reached out to PCP, who is out of the office.  BP 158/91 HR 70 No chest pain No SOB

## 2017-09-05 ENCOUNTER — Encounter (HOSPITAL_COMMUNITY): Payer: Self-pay

## 2017-09-05 ENCOUNTER — Ambulatory Visit (HOSPITAL_COMMUNITY)
Admission: EM | Admit: 2017-09-05 | Discharge: 2017-09-05 | Disposition: A | Discharge status: Home / Self Care | Payer: Medicare Other | Attending: Internal Medicine | Admitting: Internal Medicine

## 2017-09-05 DIAGNOSIS — M5412 Radiculopathy, cervical region: Secondary | ICD-10-CM | POA: Diagnosis not present

## 2017-09-05 DIAGNOSIS — M7542 Impingement syndrome of left shoulder: Secondary | ICD-10-CM

## 2017-09-05 MED ORDER — NAPROXEN 500 MG PO TABS: 500.0000 mg | ORAL_TABLET | Freq: Two times a day (BID) | ORAL | 0 refills | Status: DC

## 2017-09-05 NOTE — Discharge Instructions (Signed)
No danger signs on exam today.  Pain in left neck and shoulder seems likely to be related to wear and tear type changes at the neck and shoulder.  Prescription for naproxen, anti-inflammatory/pain reliever, was sent to the pharmacy.  Physical therapy may also help decrease pain, if discomfort persists.  Ice for 5 to 10 minutes several times daily can also help decrease pain.

## 2017-09-05 NOTE — ED Triage Notes (Signed)
Pt presents with ongoing pain in left arm with no relief.

## 2017-09-05 NOTE — ED Provider Notes (Signed)
[facility]    CSN: [id number] Arrival date & time: 09/05/17  1155     History   Chief Complaint Chief Complaint  Patient presents with  . Arm Pain    left arm     HPI [name] is a 67 y.o. female. She presents today with discomfort in her left neck/shoulder that started about a month ago.  She gardens, and had been lifting bags of mulch just prior to onset of symptoms.  Pain increases with neck extension and with external rotation/extension of left shoulder overhead.  Usually sleeps on her left side and finding it uncomfortable now to do so.  No weakness/clumsiness of the left arm.  Pain does radiate down arm into radial aspect of hand at times.    HPI  Past Medical History:  Diagnosis Date  . Clotting disorder ([place])   . Constipation    history of  . Diabetes mellitus without complication ([place])   . DVT (deep venous thrombosis) ([place])    2007  . Factor V Leiden ([place])    deficiency   . GERD (gastroesophageal reflux disease)   . Hypertension   . Hypothyroidism   . Kidney disease    early stage  . NSTEMI (non-ST elevated myocardial infarction) ([place])    2011 - possibly r/t DVT/embolization?   . Sleep apnea    no c-pap    Patient Active Problem List   Diagnosis Date Noted  . History of DVT (deep vein thrombosis) 07/18/2017  . Right leg pain 07/18/2017  . Swelling of both lower extremities 07/18/2017  . Dyslipidemia 07/18/2017  . Type 2 diabetes mellitus without complication, without long-term current use of insulin ([place]) 07/01/2016  . Hx of non-ST elevation myocardial infarction (NSTEMI) 07/01/2016  . Screening for lipid disorders 07/01/2016  . COUGH 11/27/2007  . PHLEBITIS, SUPERFICIAL VEINS, UPPER LIMB 09/29/2006  . VENOUS INSUFFICIENCY, CHRONIC, RIGHT LEG 07/30/2006  . HYPOTHYROIDISM, UNSPECIFIED 04/17/2006  . Essential hypertension 04/17/2006  . DEEP VEIN THROMBOPHLEBITIS, LEG 04/17/2006    Past Surgical History:  Procedure Laterality  Date  . CARDIAC CATHETERIZATION  10/21/2009   mild bridging(?) segment in mid LAD, otherwise normal coronaries (Dr. Alma Friendly)  . COLONOSCOPY    . INGUINAL HERNIA REPAIR     when 22 months old  . LOWER EXTREMITY ARTERIAL DOPPLER  2011   normal LEA  . SLEEP STUDY  2011   AHI 8.5 and REM AHI 18  . TRANSTHORACIC ECHOCARDIOGRAM  2011   EF 55-60%, trivial MR, TR, pulm valve regurg  . TUBAL LIGATION  1995  . UPPER GASTROINTESTINAL ENDOSCOPY       Home Medications    Prior to Admission medications   Medication Sig Start Date End Date Taking? Authorizing Provider  acetaminophen (TYLENOL) 500 MG tablet Take 1,000 mg by mouth every 6 (six) hours as needed for mild pain.    [provider]  aspirin EC 81 MG tablet Take 81 mg by mouth daily.    [provider]  ezetimibe-simvastatin (VYTORIN) 10-40 MG tablet Take 1 tablet by mouth daily. 07/18/17   Hilty, Nadean Corwin, MD  insulin glargine (LANTUS) 100 UNIT/ML injection Inject 25 Units into the skin at bedtime.    [provider]  insulin lispro (HUMALOG) 100 UNIT/ML injection Inject 5-10 Units into the skin 3 (three) times daily with meals.     [provider]  levothyroxine (SYNTHROID, LEVOTHROID) 200 MCG tablet Take 200 mcg by mouth daily.  [provider]  losartan (COZAAR) 50 MG tablet TAKE 1 TABLET BY MOUTH EVERY DAY 11/11/13   Hilty, Nadean Corwin, MD  metFORMIN (GLUCOPHAGE-XR) 500 MG 24 hr tablet Take 1,000 mg by mouth 2 (two) times daily.    [provider]  metoprolol succinate (TOPROL-XL) 25 MG 24 hr tablet TAKE 1 TABLET BY MOUTH EVERY DAY 11/11/13   Hilty, Nadean Corwin, MD  naproxen (NAPROSYN) 500 MG tablet Take 1 tablet (500 mg total) by mouth 2 (two) times daily. 09/05/17   Wynona Luna, MD    Family History Family History  Problem Relation Age of Onset  . Lung cancer Father   . Cancer Father   . Diabetes Father   . Other Mother        childbirth - enlarged heart  . Cancer  Paternal Grandmother   . Diabetes Paternal Grandmother   . Mental illness Paternal Grandmother   . Hypertension Paternal Grandfather   . Colon cancer Neg Hx     Social History Social History   Tobacco Use  . Smoking status: Never Smoker  . Smokeless tobacco: Never Used  Substance Use Topics  . Alcohol use: Yes    Alcohol/week: 0.0 oz    Comment: wine  . Drug use: No     Allergies   Patient has no known allergies.   Review of Systems Review of Systems  All other systems reviewed and are negative.    Physical Exam Triage Vital Signs ED Triage Vitals  Enc Vitals Group     BP 09/05/17 1245 (!) 165/85     Pulse Rate 09/05/17 1245 71     Resp 09/05/17 1245 20     Temp 09/05/17 1245 98 F (36.7 C)     Temp Source 09/05/17 1245 Oral     SpO2 09/05/17 1245 95 %     Weight --      Height --      Pain Score 09/05/17 1244 4     Pain Loc --    Updated Vital Signs BP (!) 165/85 (BP Location: Left Arm)   Pulse 71   Temp 98 F (36.7 C) (Oral)   Resp 20   SpO2 95%  Physical Exam  Constitutional: She is oriented to person, place, and time. No distress.  Alert, nicely groomed  HENT:  Head: Atraumatic.  Eyes:  Conjugate gaze, no eye redness/drainage  Neck: Neck supple.  Cardiovascular: Normal rate.  Pulmonary/Chest: No respiratory distress.  Lungs clear, symmetric breath sounds  Abdominal: She exhibits no distension.  Musculoskeletal: Normal range of motion.  No leg swelling Pain in left neck/shoulder increases with neck extension, and slightly increases with left neck rotation.  Pain also increases with left shoulder extension overhead/external rotation.  Neurological: She is alert and oriented to person, place, and time.  Skin: Skin is warm and dry.  No cyanosis  Nursing note and vitals reviewed.   Final Clinical Impressions(s) / UC Diagnoses   Final diagnoses:  Left cervical radiculopathy  Impingement syndrome of left shoulder     Discharge  Instructions     No danger signs on exam today.  Pain in left neck and shoulder seems likely to be related to wear and tear type changes at the neck and shoulder.  Prescription for naproxen, anti-inflammatory/pain reliever, was sent to the pharmacy.  Physical therapy may also help decrease pain, if discomfort persists.  Ice for 5 to 10 minutes several times daily can also help decrease pain.  ED Prescriptions    Medication Sig Dispense Auth. Provider   naproxen (NAPROSYN) 500 MG tablet Take 1 tablet (500 mg total) by mouth 2 (two) times daily. 30 tablet Wynona Luna, MD        Wynona Luna, MD 09/08/17 [phone]

## 2017-11-13 ENCOUNTER — Ambulatory Visit: Payer: Medicare Other | Admitting: Internal Medicine

## 2017-11-26 ENCOUNTER — Ambulatory Visit: Payer: Medicare Other | Admitting: Internal Medicine

## 2017-12-11 ENCOUNTER — Ambulatory Visit: Payer: Medicare Other | Admitting: Internal Medicine

## 2017-12-11 VITALS — BP 168/90 | HR 75 | Ht 65.0 in | Wt 187.0 lb

## 2017-12-11 DIAGNOSIS — I10 Essential (primary) hypertension: Secondary | ICD-10-CM

## 2017-12-11 DIAGNOSIS — E785 Hyperlipidemia, unspecified: Secondary | ICD-10-CM

## 2017-12-11 NOTE — Patient Instructions (Addendum)
Medication Instructions:  Dr. Debara Pickett recommends Praluent (PCSK9). This is an injectable cholesterol medication. This medication will need prior approval with your insurance company, which we will work on. If the medication is not approved initially, we may need to do an appeal with your insurance. We will keep you updated on this process. This medication can be provided at some local pharmacies or be shipped to you from a specialty pharmacy.   If you need a refill on your cardiac medications before your next appointment, please call your pharmacy.   Lab work: FASTING lab work to check cholesterol in 3-4 months (prior to next visit with Dr. Debara Pickett) If you have labs (blood work) drawn today and your tests are completely normal, you will receive your results only by: Marland Kitchen MyChart Message (if you have MyChart) OR . A paper copy in the mail If you have any lab test that is abnormal or we need to change your treatment, we will call you to review the results.  Follow-Up: At [facility], you and your health needs are our priority.  As part of our continuing mission to provide you with exceptional heart care, we have created designated Provider Care Teams.  These Care Teams include your primary Cardiologist (physician) and Advanced Practice Providers (APPs -  Physician Assistants and Nurse Practitioners) who all work together to provide you with the care you need, when you need it. . You will need a follow up appointment in 3-4 months with Dr. Debara Pickett for lipid clinic.  Any Other Special Instructions Will Be Listed Below (If Applicable).

## 2017-12-11 NOTE — Progress Notes (Signed)
OFFICE NOTE  Chief Complaint:  Follow-up  Primary Care Physician: Holly Hatchet, MD  HPI:  [name] is a 67 year old female with history of non-STEMI, thought to be due to DVT and embolization. She may have had a small PE at the time, too, but was not diagnosed. 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 She also has sleep apnea, on CPAP, has done well, but has stopped using it, however, because she did not feel it helped her. Last year she was in [place], reported to have had some chest pains, which were thought to be musculoskeletal related to her Lipitor, and they recommended discontinuing that. Overall, she has done fairly well and has had no further problems with palpitations or any chest pain.   Today she returns for followup. She reports that sometime in the past year she had an episode of bleeding after she cut her finger which took more than 10 minutes to stop. This was very scary for her and she decided to discontinue taking warfarin. She said was not related to cost or the convenience of getting her INRs checked. She does understand that she has a factor V Leiden deficiency and even though her DVT/PE was provoked the injury, she is at increased risk for recurrent DVT or PE. Aspirin may only provide minimal benefit.  She was also commended and the fact that she's lost over 20 pounds. She's been active and has been exercising 4 days a week.  07/01/2016  [name] returns for follow-up today. It's been about 2 years since I last saw her. She's been variably compliant with her medications. She says she did not take her blood pressure medicines today and blood pressure was elevated. She's not taking aspirin regularly either. She took herself off of warfarin and is at risk for future clots due to history of factor V Leiden deficiency. Recently she presented the ER was found to be hyperglycemic and  new diagnosis of diabetes. She is working on dietary changes to help with that. She's now on metformin. I reiterated the fact that she should be on low-dose aspirin as diabetes is a coronary artery disease risk equivalent. She'll also need lipid profile prior to her follow-up in 6 months.  07/18/2017  [name] is seen today in follow-up.  She is doing quite well over the last year.  Today she is complaining of some right leg pain she is also had some left arm pain.  She reports lower extremity bruising and some insignificant edema.  She has a history of DVT in the past and is concerned about recurrence.  She is been compliant with aspirin which likely explains her bruising.  She did have lab work which I requested last year and showed total cholesterol 304, HDL 84, LDL 192 and triglycerides 140.  She was supposed to start a atorvastatin however did not.  She is interested in taking simvastatin because her [name] is on that and it is tolerated.  I did explain this is a moderate potency statin and unlikely to get her to goal LDL less than 70 given her history of non-STEMI.  12/11/2017  [name] is seen today in follow-up.  Pressure was noted to be elevated today 168/90.  She reports compliance with ezetimibe/simvastatin and noted changes in her diet.  This is resulted in a nice improvement in total cholesterol now at 223 with HDL  84 and LDL 130.  Triglycerides were 46.  As she has a history of MI, we have set a target LDL less than 70 although this was likely thought to be due to thrombotic occlusion of the coronary rather than acute plaque rupture.  She also has a notably elevated HDL cholesterol which may be cardioprotective.  PMHx:  Past Medical History:  Diagnosis Date  . Clotting disorder ([place])   . Constipation    history of  . Diabetes mellitus without complication ([place])   . DVT (deep venous thrombosis) ([place])    2007  . Factor V Leiden ([place])    deficiency   . GERD (gastroesophageal reflux  disease)   . Hypertension   . Hypothyroidism   . Kidney disease    early stage  . NSTEMI (non-ST elevated myocardial infarction) ([place])    2011 - possibly r/t DVT/embolization?   . Sleep apnea    no c-pap    Past Surgical History:  Procedure Laterality Date  . CARDIAC CATHETERIZATION  10/21/2009   mild bridging(?) segment in mid LAD, otherwise normal coronaries (Dr. Alma Friendly)  . COLONOSCOPY    . INGUINAL HERNIA REPAIR     when 68 months old  . LOWER EXTREMITY ARTERIAL DOPPLER  2011   normal LEA  . SLEEP STUDY  2011   AHI 8.5 and REM AHI 18  . TRANSTHORACIC ECHOCARDIOGRAM  2011   EF 55-60%, trivial MR, TR, pulm valve regurg  . TUBAL LIGATION  1995  . UPPER GASTROINTESTINAL ENDOSCOPY      FAMHx:  Family History  Problem Relation Age of Onset  . Lung cancer Father   . Cancer Father   . Diabetes Father   . Other Mother        childbirth - enlarged heart  . Cancer Paternal Grandmother   . Diabetes Paternal Grandmother   . Mental illness Paternal Grandmother   . Hypertension Paternal Grandfather   . Colon cancer Neg Hx     SOCHx:   reports that she has never smoked. She has never used smokeless tobacco. She reports that she drinks alcohol. She reports that she does not use drugs.  ALLERGIES:  No Known Allergies  ROS: Pertinent items noted in HPI and remainder of comprehensive ROS otherwise negative.  HOME MEDS: Current Outpatient Medications  Medication Sig Dispense Refill  . acetaminophen (TYLENOL) 500 MG tablet Take 1,000 mg by mouth every 6 (six) hours as needed for mild pain.    Marland Kitchen aspirin EC 81 MG tablet Take 81 mg by mouth daily.    Marland Kitchen ezetimibe-simvastatin (VYTORIN) 10-40 MG tablet Take 1 tablet by mouth daily. 90 tablet 3  . insulin glargine (LANTUS) 100 UNIT/ML injection Inject 25 Units into the skin at bedtime.    . insulin lispro (HUMALOG) 100 UNIT/ML injection Inject 5-10 Units into the skin 3 (three) times daily with meals.     Marland Kitchen levothyroxine (SYNTHROID,  LEVOTHROID) 200 MCG tablet Take 200 mcg by mouth daily.    Marland Kitchen losartan (COZAAR) 50 MG tablet TAKE 1 TABLET BY MOUTH EVERY DAY 90 tablet 3  . metFORMIN (GLUCOPHAGE-XR) 500 MG 24 hr tablet Take 1,000 mg by mouth 2 (two) times daily.    . metoprolol succinate (TOPROL-XL) 25 MG 24 hr tablet TAKE 1 TABLET BY MOUTH EVERY DAY 90 tablet 3  . naproxen (NAPROSYN) 500 MG tablet Take 1 tablet (500 mg total) by mouth 2 (two) times daily. 30 tablet 0   No current facility-administered medications  for this visit.     LABS/IMAGING: No results found for this or any previous visit (from the past 48 hour(s)). No results found.  VITALS: There were no vitals taken for this visit.  EXAM: General appearance: alert and no distress Neck: no carotid bruit and no JVD Lungs: clear to auscultation bilaterally Heart: regular rate and rhythm, S1, S2 normal, no murmur, click, rub or gallop Abdomen: soft, non-tender; bowel sounds normal; no masses,  no organomegaly Extremities: extremities normal, atraumatic, no cyanosis or edema, venous stasis dermatitis noted and Mild ecchymosis Pulses: 2+ and symmetric Skin: Skin color, texture, turgor normal. No rashes or lesions Neurologic: Grossly normal Psych: Pleasant  EKG: Normal sinus rhythm 75-personally reviewed  ASSESSMENT: 1. History of NSTEMI secondary to thrombosis 2. History of DVT-on warfarin however self discontinued 3. GERD 4. Possible factor V Leiden deficiency 5. HTN - controlled 6. DM2 7. Marked dyslipidemia  PLAN: 1.   Ms. Grist has had improvement in her lipid profile on ezetimibe/simvastatin however remains above goal LDL less than 70.  I think she is a good candidate for PCSK9 inhibitor therapy given her prior history of non-STEMI.  It was thought this was due to thrombosis however not totally clear.  I believe therefore aggressive lipid management is warranted.  We will proceed to Praluent 150 mg q. 2 weeks and repeat a lipid profile and follow-up  with me in 3 to 4 months.  Pixie Casino, MD, [facility], [place] Director of the Advanced Lipid Disorders &  Cardiovascular Risk Reduction Clinic Diplomate of the American Board of Clinical Lipidology Attending Cardiologist  Direct Dial: [phone]  Fax: [phone]  Website:  [email or web address] 12/11/2017, 2:40 PM

## 2017-12-12 ENCOUNTER — Encounter: Payer: Self-pay | Admitting: Internal Medicine

## 2017-12-12 ENCOUNTER — Telehealth: Payer: Self-pay | Admitting: Internal Medicine

## 2017-12-12 NOTE — Telephone Encounter (Signed)
Prior authorization for Praluent 150mg /mL submitted via [email or web address]  (Key: [id number])

## 2017-12-15 NOTE — Telephone Encounter (Signed)
Per [email or web address], [place] for Praluent 150mg /mL has been approved  Request Reference Number: [id number].  PRALUENT INJ 150MG /ML is approved through 06/13/2018

## 2017-12-17 NOTE — Telephone Encounter (Signed)
LM for patient to return call to discuss approval & patient assistance

## 2017-12-22 ENCOUNTER — Telehealth: Payer: Self-pay | Admitting: Internal Medicine

## 2017-12-22 NOTE — Telephone Encounter (Signed)
Follow  Up:   Patient calling back about lab results

## 2017-12-22 NOTE — Telephone Encounter (Signed)
Returned call to patient concerning Praluent approval. She would like to see if she qualifies for patient assistance. She states she will return to office 12/23/17

## 2017-12-22 NOTE — Telephone Encounter (Signed)
Patient states she will bring in patient assistance app 12/23/17

## 2018-01-05 NOTE — Telephone Encounter (Signed)
LMTCB on [phone] regarding status of patient assistance paperwork - Is she going to complete it? If so, can she bring this week for submission?

## 2018-01-06 ENCOUNTER — Emergency Department (HOSPITAL_COMMUNITY): Payer: Medicare Other

## 2018-01-06 ENCOUNTER — Other Ambulatory Visit: Payer: Self-pay

## 2018-01-06 ENCOUNTER — Encounter (HOSPITAL_COMMUNITY): Payer: Self-pay | Admitting: Emergency Medicine

## 2018-01-06 ENCOUNTER — Observation Stay (HOSPITAL_BASED_OUTPATIENT_CLINIC_OR_DEPARTMENT_OTHER): Payer: Medicare Other

## 2018-01-06 ENCOUNTER — Observation Stay (HOSPITAL_COMMUNITY)
Admission: EM | Admit: 2018-01-06 | Discharge: 2018-01-07 | Disposition: A | Discharge status: Home / Self Care | Payer: Medicare Other | Attending: Internal Medicine | Admitting: Internal Medicine

## 2018-01-06 DIAGNOSIS — I10 Essential (primary) hypertension: Secondary | ICD-10-CM | POA: Diagnosis present

## 2018-01-06 DIAGNOSIS — I340 Nonrheumatic mitral (valve) insufficiency: Secondary | ICD-10-CM

## 2018-01-06 DIAGNOSIS — Z7982 Long term (current) use of aspirin: Secondary | ICD-10-CM | POA: Insufficient documentation

## 2018-01-06 DIAGNOSIS — Z86718 Personal history of other venous thrombosis and embolism: Secondary | ICD-10-CM

## 2018-01-06 DIAGNOSIS — G4730 Sleep apnea, unspecified: Secondary | ICD-10-CM | POA: Insufficient documentation

## 2018-01-06 DIAGNOSIS — E1122 Type 2 diabetes mellitus with diabetic chronic kidney disease: Secondary | ICD-10-CM | POA: Insufficient documentation

## 2018-01-06 DIAGNOSIS — I129 Hypertensive chronic kidney disease with stage 1 through stage 4 chronic kidney disease, or unspecified chronic kidney disease: Principal | ICD-10-CM | POA: Insufficient documentation

## 2018-01-06 DIAGNOSIS — R079 Chest pain, unspecified: Secondary | ICD-10-CM | POA: Diagnosis not present

## 2018-01-06 DIAGNOSIS — D6851 Activated protein C resistance: Secondary | ICD-10-CM | POA: Diagnosis not present

## 2018-01-06 DIAGNOSIS — Z794 Long term (current) use of insulin: Secondary | ICD-10-CM | POA: Diagnosis not present

## 2018-01-06 DIAGNOSIS — N183 Chronic kidney disease, stage 3 unspecified: Secondary | ICD-10-CM | POA: Diagnosis present

## 2018-01-06 DIAGNOSIS — N1831 Chronic kidney disease, stage 3a: Secondary | ICD-10-CM | POA: Diagnosis present

## 2018-01-06 DIAGNOSIS — R0789 Other chest pain: Secondary | ICD-10-CM | POA: Insufficient documentation

## 2018-01-06 DIAGNOSIS — K219 Gastro-esophageal reflux disease without esophagitis: Secondary | ICD-10-CM | POA: Diagnosis not present

## 2018-01-06 DIAGNOSIS — Z8249 Family history of ischemic heart disease and other diseases of the circulatory system: Secondary | ICD-10-CM | POA: Diagnosis not present

## 2018-01-06 DIAGNOSIS — E039 Hypothyroidism, unspecified: Secondary | ICD-10-CM | POA: Diagnosis not present

## 2018-01-06 DIAGNOSIS — Z79899 Other long term (current) drug therapy: Secondary | ICD-10-CM | POA: Insufficient documentation

## 2018-01-06 DIAGNOSIS — Z7901 Long term (current) use of anticoagulants: Secondary | ICD-10-CM | POA: Diagnosis not present

## 2018-01-06 DIAGNOSIS — E785 Hyperlipidemia, unspecified: Secondary | ICD-10-CM | POA: Insufficient documentation

## 2018-01-06 DIAGNOSIS — E119 Type 2 diabetes mellitus without complications: Secondary | ICD-10-CM

## 2018-01-06 DIAGNOSIS — I252 Old myocardial infarction: Secondary | ICD-10-CM | POA: Insufficient documentation

## 2018-01-06 LAB — CBC
HCT: 39.5 % (ref 36.0–46.0)
Hemoglobin: 12.8 g/dL (ref 12.0–15.0)
MCH: 28.2 pg (ref 26.0–34.0)
MCHC: 32.4 g/dL (ref 30.0–36.0)
MCV: 87 fL (ref 80.0–100.0)
Platelets: 295 10*3/uL (ref 150–400)
RBC: 4.54 MIL/uL (ref 3.87–5.11)
RDW: 13.8 % (ref 11.5–15.5)
WBC: 6.2 10*3/uL (ref 4.0–10.5)
nRBC: 0 % (ref 0.0–0.2)

## 2018-01-06 LAB — BASIC METABOLIC PANEL
Anion gap: 8 (ref 5–15)
BUN: 21 mg/dL (ref 8–23)
CO2: 26 mmol/L (ref 22–32)
Calcium: 9.9 mg/dL (ref 8.9–10.3)
Chloride: 103 mmol/L (ref 98–111)
Creatinine, Ser: 1.25 mg/dL — ABNORMAL HIGH (ref 0.44–1.00)
GFR calc Af Amer: 50 mL/min — ABNORMAL LOW (ref >60)
GFR calc non Af Amer: 43 mL/min — ABNORMAL LOW (ref >60)
Glucose, Bld: 270 mg/dL — ABNORMAL HIGH (ref 70–99)
Potassium: 4.3 mmol/L (ref 3.5–5.1)
Sodium: 137 mmol/L (ref 135–145)

## 2018-01-06 LAB — GLUCOSE, CAPILLARY
Glucose-Capillary: 184 mg/dL — ABNORMAL HIGH (ref 70–99)
Glucose-Capillary: 293 mg/dL — ABNORMAL HIGH (ref 70–99)
Glucose-Capillary: 101 mg/dL — ABNORMAL HIGH (ref 70–99)
Glucose-Capillary: 275 mg/dL — ABNORMAL HIGH (ref 70–99)

## 2018-01-06 LAB — TROPONIN I
Troponin I: <0.03 ng/mL (ref <0.03)
Troponin I: <0.03 ng/mL (ref <0.03)

## 2018-01-06 LAB — ECHOCARDIOGRAM COMPLETE
Height: 64 in
Weight: 2980.62 oz

## 2018-01-06 LAB — I-STAT TROPONIN, ED: Troponin i, poc: 0 ng/mL (ref 0.00–0.08)

## 2018-01-06 MED ORDER — ENOXAPARIN SODIUM 40 MG/0.4ML ~~LOC~~ SOLN
40.0000 mg | SUBCUTANEOUS | Status: DC
  Administered 2018-01-06: 40 mg via SUBCUTANEOUS
  Filled 2018-01-06: qty 0.4

## 2018-01-06 MED ORDER — PANTOPRAZOLE SODIUM 40 MG PO TBEC
40.0000 mg | DELAYED_RELEASE_TABLET | Freq: Every day | ORAL | Status: DC
  Administered 2018-01-06 – 2018-01-07 (×2): 40 mg via ORAL
  Filled 2018-01-06 (×2): qty 1

## 2018-01-06 MED ORDER — ONDANSETRON HCL 4 MG/2ML IJ SOLN: 4.0000 mg | Freq: Four times a day (QID) | INTRAMUSCULAR | Status: DC | PRN

## 2018-01-06 MED ORDER — MORPHINE SULFATE (PF) 2 MG/ML IV SOLN: 1.0000 mg | INTRAVENOUS | Status: DC | PRN

## 2018-01-06 MED ORDER — EZETIMIBE-SIMVASTATIN 10-40 MG PO TABS: 1.0000 | ORAL_TABLET | Freq: Every day | ORAL | Status: DC

## 2018-01-06 MED ORDER — SODIUM CHLORIDE (PF) 0.9 % IJ SOLN
INTRAMUSCULAR | Status: AC
  Filled 2018-01-06: qty 50

## 2018-01-06 MED ORDER — METOPROLOL SUCCINATE ER 25 MG PO TB24
25.0000 mg | ORAL_TABLET | Freq: Every day | ORAL | Status: DC
  Administered 2018-01-06 – 2018-01-07 (×2): 25 mg via ORAL
  Filled 2018-01-06 (×2): qty 1

## 2018-01-06 MED ORDER — EZETIMIBE-SIMVASTATIN 10-20 MG PO TABS
1.0000 | ORAL_TABLET | Freq: Every day | ORAL | Status: DC
  Administered 2018-01-06 – 2018-01-07 (×2): 1 via ORAL
  Filled 2018-01-06 (×2): qty 1

## 2018-01-06 MED ORDER — ASPIRIN 81 MG PO CHEW: 324.0000 mg | CHEWABLE_TABLET | ORAL | Status: AC

## 2018-01-06 MED ORDER — ASPIRIN 300 MG RE SUPP: 300.0000 mg | RECTAL | Status: AC

## 2018-01-06 MED ORDER — LOSARTAN POTASSIUM 50 MG PO TABS
50.0000 mg | ORAL_TABLET | Freq: Every day | ORAL | Status: DC
  Administered 2018-01-06 – 2018-01-07 (×2): 50 mg via ORAL
  Filled 2018-01-06 (×2): qty 1

## 2018-01-06 MED ORDER — INSULIN ASPART 100 UNIT/ML ~~LOC~~ SOLN
0.0000 [IU] | Freq: Three times a day (TID) | SUBCUTANEOUS | Status: DC
  Administered 2018-01-06: 3 [IU] via SUBCUTANEOUS
  Administered 2018-01-06: 8 [IU] via SUBCUTANEOUS
  Administered 2018-01-07 (×2): 5 [IU] via SUBCUTANEOUS

## 2018-01-06 MED ORDER — LEVOTHYROXINE SODIUM 200 MCG PO TABS
200.0000 ug | ORAL_TABLET | Freq: Every day | ORAL | Status: DC
  Administered 2018-01-06 – 2018-01-07 (×2): 200 ug via ORAL
  Filled 2018-01-06 (×2): qty 1
  Filled 2018-01-06: qty 2

## 2018-01-06 MED ORDER — ACETAMINOPHEN 325 MG PO TABS
650.0000 mg | ORAL_TABLET | ORAL | Status: DC | PRN
  Administered 2018-01-06 – 2018-01-07 (×3): 650 mg via ORAL
  Filled 2018-01-06 (×3): qty 2

## 2018-01-06 MED ORDER — INSULIN ASPART 100 UNIT/ML ~~LOC~~ SOLN
0.0000 [IU] | Freq: Every day | SUBCUTANEOUS | Status: DC
  Administered 2018-01-06: 3 [IU] via SUBCUTANEOUS

## 2018-01-06 MED ORDER — INSULIN GLARGINE 100 UNIT/ML ~~LOC~~ SOLN
15.0000 [IU] | Freq: Every day | SUBCUTANEOUS | Status: DC
  Administered 2018-01-06: 15 [IU] via SUBCUTANEOUS
  Filled 2018-01-06 (×2): qty 0.15

## 2018-01-06 MED ORDER — IOPAMIDOL (ISOVUE-370) INJECTION 76%
100.0000 mL | Freq: Once | INTRAVENOUS | Status: AC | PRN
  Administered 2018-01-06: 100 mL via INTRAVENOUS

## 2018-01-06 MED ORDER — IOPAMIDOL (ISOVUE-370) INJECTION 76%
INTRAVENOUS | Status: AC
  Filled 2018-01-06: qty 100

## 2018-01-06 MED ORDER — ASPIRIN EC 81 MG PO TBEC
81.0000 mg | DELAYED_RELEASE_TABLET | Freq: Every day | ORAL | Status: DC
  Administered 2018-01-07: 81 mg via ORAL
  Filled 2018-01-06: qty 1

## 2018-01-06 NOTE — Progress Notes (Signed)
  Echocardiogram 2D Echocardiogram has been performed.  [name] 01/06/2018, 2:52 PM

## 2018-01-06 NOTE — ED Notes (Signed)
2x attempt at bloodwork and IV unsuccessful.  Charge RN to draw labs now.

## 2018-01-06 NOTE — ED Provider Notes (Signed)
[facility] DEPT Provider Note   CSN: [id number] Arrival date & time: 01/06/18  0044     History   Chief Complaint Chief Complaint  Patient presents with  . Chest Pain    HPI [name] is a 67 y.o. female.  Patient presents to the emergency department for evaluation of chest pain.  She reports that she has been experiencing a heaviness in the center of her chest, on and off, for the last several days.  She believes that the pain generally occurs when she is up and moving around, has not had to sit down or rest because of the pain.  Tonight the pain worsened, she thought she had indigestion.  Pain radiated to the left arm.  She reports that she did feel short of breath when this occurred.  She has improved before coming to the ER but still has a heaviness in the center of her chest.     Past Medical History:  Diagnosis Date  . Clotting disorder ([place])   . Constipation    history of  . Diabetes mellitus without complication ([place])   . DVT (deep venous thrombosis) ([place])    2007  . Factor V Leiden ([place])    deficiency   . GERD (gastroesophageal reflux disease)   . Hypertension   . Hypothyroidism   . Kidney disease    early stage  . NSTEMI (non-ST elevated myocardial infarction) ([place])    2011 - possibly r/t DVT/embolization?   . Sleep apnea    no c-pap    Patient Active Problem List   Diagnosis Date Noted  . History of DVT (deep vein thrombosis) 07/18/2017  . Right leg pain 07/18/2017  . Swelling of both lower extremities 07/18/2017  . Dyslipidemia 07/18/2017  . Type 2 diabetes mellitus without complication, without long-term current use of insulin ([place]) 07/01/2016  . Hx of non-ST elevation myocardial infarction (NSTEMI) 07/01/2016  . Screening for lipid disorders 07/01/2016  . COUGH 11/27/2007  . PHLEBITIS, SUPERFICIAL VEINS, UPPER LIMB 09/29/2006  . VENOUS INSUFFICIENCY, CHRONIC, RIGHT LEG 07/30/2006  . HYPOTHYROIDISM,  UNSPECIFIED 04/17/2006  . Essential hypertension 04/17/2006  . DEEP VEIN THROMBOPHLEBITIS, LEG 04/17/2006    Past Surgical History:  Procedure Laterality Date  . CARDIAC CATHETERIZATION  10/21/2009   mild bridging(?) segment in mid LAD, otherwise normal coronaries (Dr. Alma Friendly)  . COLONOSCOPY    . INGUINAL HERNIA REPAIR     when 48 months old  . LOWER EXTREMITY ARTERIAL DOPPLER  2011   normal LEA  . SLEEP STUDY  2011   AHI 8.5 and REM AHI 18  . TRANSTHORACIC ECHOCARDIOGRAM  2011   EF 55-60%, trivial MR, TR, pulm valve regurg  . TUBAL LIGATION  1995  . UPPER GASTROINTESTINAL ENDOSCOPY       OB History   None      Home Medications    Prior to Admission medications   Medication Sig Start Date End Date Taking? Authorizing Provider  acetaminophen (TYLENOL) 500 MG tablet Take 1,000 mg by mouth every 6 (six) hours as needed for mild pain.   Yes [provider]  aspirin EC 81 MG tablet Take 81 mg by mouth daily.   Yes [provider]  insulin glargine (LANTUS) 100 UNIT/ML injection Inject 25 Units into the skin at bedtime.   Yes [provider]  insulin lispro (HUMALOG) 100 UNIT/ML injection Inject 5-10 Units into the skin 3 (three) times daily with meals.  Yes [provider]  levothyroxine (SYNTHROID, LEVOTHROID) 200 MCG tablet Take 200 mcg by mouth daily before breakfast.    Yes [provider]  losartan (COZAAR) 50 MG tablet TAKE 1 TABLET BY MOUTH EVERY DAY Patient taking differently: Take 50 mg by mouth daily.  11/11/13  Yes Hilty, Nadean Corwin, MD  metFORMIN (GLUCOPHAGE-XR) 500 MG 24 hr tablet Take 1,000 mg by mouth 2 (two) times daily.   Yes [provider]  metoprolol succinate (TOPROL-XL) 25 MG 24 hr tablet TAKE 1 TABLET BY MOUTH EVERY DAY Patient taking differently: Take 25 mg by mouth daily.  11/11/13  Yes Hilty, Nadean Corwin, MD  ezetimibe-simvastatin (VYTORIN) 10-40 MG tablet Take 1 tablet by mouth daily. Patient not  taking: Reported on 01/06/2018 07/18/17   Pixie Casino, MD    Family History Family History  Problem Relation Age of Onset  . Lung cancer Father   . Cancer Father   . Diabetes Father   . Other Mother        childbirth - enlarged heart  . Cancer Paternal Grandmother   . Diabetes Paternal Grandmother   . Mental illness Paternal Grandmother   . Hypertension Paternal Grandfather   . Colon cancer Neg Hx     Social History Social History   Tobacco Use  . Smoking status: Never Smoker  . Smokeless tobacco: Never Used  Substance Use Topics  . Alcohol use: Yes    Alcohol/week: 0.0 standard drinks    Comment: wine  . Drug use: No     Allergies   Patient has no known allergies.   Review of Systems Review of Systems  Respiratory: Positive for shortness of breath.   Cardiovascular: Positive for chest pain.  All other systems reviewed and are negative.    Physical Exam Updated Vital Signs BP 139/81 (BP Location: Left Arm)   Pulse 73   Temp (!) 97.5 F (36.4 C) (Oral)   Resp 18   Ht 5\' 4"  (1.626 m)   Wt 81.6 kg   SpO2 97%   BMI 30.90 kg/m   Physical Exam  Constitutional: She is oriented to person, place, and time. She appears well-developed and well-nourished. No distress.  HENT:  Head: Normocephalic and atraumatic.  Right Ear: Hearing normal.  Left Ear: Hearing normal.  Nose: Nose normal.  Mouth/Throat: Oropharynx is clear and moist and mucous membranes are normal.  Eyes: Pupils are equal, round, and reactive to light. Conjunctivae and EOM are normal.  Neck: Normal range of motion. Neck supple.  Cardiovascular: Regular rhythm, S1 normal and S2 normal. Exam reveals no gallop and no friction rub.  No murmur heard. Pulmonary/Chest: Effort normal and breath sounds normal. No respiratory distress. She exhibits no tenderness.  Abdominal: Soft. Normal appearance and bowel sounds are normal. There is no hepatosplenomegaly. There is no tenderness. There is no rebound,  no guarding, no tenderness at McBurney's point and negative Murphy's sign. No hernia.  Musculoskeletal: Normal range of motion.  Neurological: She is alert and oriented to person, place, and time. She has normal strength. No cranial nerve deficit or sensory deficit. Coordination normal. GCS eye subscore is 4. GCS verbal subscore is 5. GCS motor subscore is 6.  Skin: Skin is warm, dry and intact. No rash noted. No cyanosis.  Psychiatric: She has a normal mood and affect. Her speech is normal and behavior is normal. Thought content normal.  Nursing note and vitals reviewed.    ED Treatments / Results  Labs (all  labs ordered are listed, but only abnormal results are displayed) Labs Reviewed  BASIC METABOLIC PANEL - Abnormal; Notable for the following components:      Result Value   Glucose, Bld 270 (*)    Creatinine, Ser 1.25 (*)    GFR calc non Af Amer 43 (*)    GFR calc Af Amer 50 (*)    All other components within normal limits  CBC  I-STAT TROPONIN, ED    EKG EKG Interpretation  Date/Time:  Tuesday January 06 2018 00:52:27 EST Ventricular Rate:  73 PR Interval:    QRS Duration: 95 QT Interval:  406 QTC Calculation: 448 R Axis:   47 Text Interpretation:  Sinus rhythm Minimal ST depression, lateral leads Baseline wander in lead(s) II aVF No significant change since last tracing Confirmed by Orpah Greek [phone]) on 01/06/2018 1:51:10 AM   Radiology Dg Chest 2 View  Result Date: 01/06/2018 CLINICAL DATA:  Chest pain EXAM: CHEST - 2 VIEW COMPARISON:  06/10/2017 FINDINGS: The heart size and mediastinal contours are within normal limits. Both lungs are clear. The visualized skeletal structures are unremarkable. IMPRESSION: No active cardiopulmonary disease. Electronically Signed   By: Ulyses Jarred M.D.   On: 01/06/2018 01:48   Ct Angio Chest Pe W Or Wo Contrast  Result Date: 01/06/2018 CLINICAL DATA:  Chest pain for several days radiating down the left arm. EXAM:  CT ANGIOGRAPHY CHEST WITH CONTRAST TECHNIQUE: Multidetector CT imaging of the chest was performed using the standard protocol during bolus administration of intravenous contrast. Multiplanar CT image reconstructions and MIPs were obtained to evaluate the vascular anatomy. CONTRAST:  177mL ISOVUE-370 IOPAMIDOL (ISOVUE-370) INJECTION 76% COMPARISON:  None. FINDINGS: Cardiovascular: Good opacification of the central and segmental pulmonary arteries. No focal filling defects. No evidence of significant pulmonary embolus. Normal heart size. No pericardial effusions. Normal caliber thoracic aorta. No aortic dissection. Great vessel origins are patent. Mediastinum/Nodes: No significant lymphadenopathy. Esophagus is decompressed. Lungs/Pleura: Atelectasis in the lung bases. Nodularity adjacent to the left major fissure probably representing inter fissural lymph node or small fluid collection. No consolidation in the lungs. No pleural effusions. No pneumothorax. Airways are patent. Upper Abdomen: No acute abnormalities identified. Musculoskeletal: Degenerative changes in the spine. No destructive bone lesions. Review of the MIP images confirms the above findings. IMPRESSION: No evidence of significant pulmonary embolus. No evidence of active pulmonary disease. Electronically Signed   By: Lucienne Capers M.D.   On: 01/06/2018 04:56    Procedures Procedures (including critical care time)  Medications Ordered in ED Medications  sodium chloride (PF) 0.9 % injection (has no administration in time range)  iopamidol (ISOVUE-370) 76 % injection (has no administration in time range)  iopamidol (ISOVUE-370) 76 % injection 100 mL (100 mLs Intravenous Contrast Given 01/06/18 0435)     Initial Impression / Assessment and Plan / ED Course  I have reviewed the triage vital signs and the nursing notes.  Pertinent labs & imaging results that were available during my care of the patient were reviewed by me and considered in  my medical decision making (see chart for details).     Patient presents to the emergency department for evaluation of chest pain.  She reports intermittent pain for the last 3 days.  She states that it often happens when she is up and moving around, but has not specifically noticed if it is exertional or if it improves if she sits down.  Tonight she had an episode that was worse, thought  it might be indigestion.  This episode was associated with shortness of breath.  Reviewing patient's records reveals that she has a history of factor V Leiden.  She had an NSTEMI in 2011.  At that time heart catheterization did not show blockages, it was blamed on a thrombus secondary to her factor V Leiden, DVT.  Had been on Coumadin, but it was stopped by her cardiologist.  Patient asked for it to be stopped after she cut her finger with a knife and bled for an hour.  She reports that she was afraid she was going to bleed out because she lives alone.  She has only been on aspirin since then.  Based on this, PE was felt to be a strong possibility.  She therefore underwent CT angiography which did not show any acute pathology including no evidence of PE.  Patient's EKG does have nonspecific depressions inferiorly but this has been seen on previous EKGs, is unchanged.  Her heart score is 5.  I recommended admission to the patient based on this, but she tells me that she is caring for an aunt and cannot be admitted.  We did discuss risks and benefits, patient is unable to stay in the hospital at this time.  I told her that I would therefore perform a second troponin and have her promptly follow-up with PCP and cardiology, return for admission if symptoms worsen.  She was in agreement with this plan.  She understands that admission would be less risky course of action today.  Final Clinical Impressions(s) / ED Diagnoses   Final diagnoses:  Chest pain, unspecified type    ED Discharge Orders    None       Orpah Greek, MD 01/06/18 [phone]

## 2018-01-06 NOTE — Progress Notes (Signed)
Patient is a pleasant 67 year old female history of factor V Leiden with history of DVT no longer on anticoagulation, hypertension, insulin-dependent diabetes mellitus, chronic kidney disease stage III, hypothyroidism, prior history of non-STEMI felt secondary to PE presented to the ED with left substernal chest pain described as a pressure radiating to the left upper extremity.  Patient with typical symptoms with prior history of coronary artery disease with multiple risk factors.  Cardiac enzymes pending.  2D echo pending.  CT angiogram chest negative for PE.  Check a fasting lipid panel.  Consult with cardiology for further evaluation and management.  No charge.

## 2018-01-06 NOTE — Consult Note (Signed)
Cardiology Consult    Patient ID: TAHNEE CIFUENTES MRN: [id number], DOB/AGE: May 09, 1950   Admit date: 01/06/2018 Date of Consult: 01/06/2018  Primary Physician: Velna Hatchet, MD Primary Cardiologist: Pixie Casino, MD  Patient Profile    [name] is a 67 y.o. female with a history of Factor V Leiden with history DVTs no longer on chronic anticoagulation,  NSTEMI in 2011 felt to be secondary to thrombotic occlusion rather than acute plaque rupture (normal coronaries with some LAD bridging on heart catheterization), hypertension, insulin-dependent diabetes mellitus, sleep apnea non-compliant with CPAP, CKD stage III, and hypothyroidism who is being seen today for the evaluation of chest pain at the request of Dr. Myna Hidalgo.   History of Present Illness    [name] is a 67 year old female with the above history who is followed by Dr. Debara Pickett. [name] last saw Dr. Debara Pickett on 12/11/2017 at which time [name] reported that [name] had stopped taking Coumadin after an episode of prolonged bleeding after cutting her finger. Otherwise, [name] was doing well at that time. The plan was to start Praluent in addition to ezetimibe/simvastatin for additional LDL control; however, patient has not started this yet.  Patient presents to the ED today for evaluation of chest pain. Patient reports intermittent brief episodes of chest tightness for the last couple of weeks normally at rest. [name] states at times this pain is "excruciating." This then progressed to what [name] describes as a pressure and a "twinge" about 2-3 days ago which significantly worsened last night. Around 8pm last night, [name] reports it felt "like someone was sitting on her chest." [name] ranks the pain as 6/10 on the pain scale and reports associated shortness of breath. Pain is non-radiating. [name] denies any associated palpitations, lightheadedness/dizziness, or nausea/vomiting. [name] initially thought the pain was due to indigestion because it started after  [name] had just finished eating dinner. Pain is not associated with position, and patient denies any recent fever or illness. Pain persisted until [name] got to the ED when it gradually resolved on its own. This morning patient reported left arm pain. [name] describes the pain as a squeezing sensation that starts in her left shoulder and radiates down her arm with associated numbness. [name] denies any chest pain with the arm pain. Pain is similar to left arm pain that [name] had several months ago (08/2017). [name] was evaluated at an Urgent Care at that time. Pain was felt to be musculoskeletal as patient states [name] does a lot of gardening, and [name] was prescribed Naproxen which helped the pain. Patient denies any recent gardening, strenuous activity, or injury.   Upon arrival to the ED, vitals stable. EKG showed normal sinus rhythm with no significant changes from prior tracings. I-stat troponin negative. Chest x-ray showed no acute findings. Chest CTA showed no evidence of significant pulmonary embolism. CBC unremarkable. Na 137, K 4.3, Glucose 270, SCr 1.25.   Patient denies any current chest pain or shortness of breath. [name] continues to have some mild left arm pain.   Past Medical History   Past Medical History:  Diagnosis Date  . Clotting disorder ([place])   . Constipation    history of  . Diabetes mellitus without complication ([place])   . DVT (deep venous thrombosis) ([place])    2007  . Factor V Leiden ([place])    deficiency   . GERD (gastroesophageal reflux disease)   . Hypertension   . Hypothyroidism   . Kidney disease    early  stage  . NSTEMI (non-ST elevated myocardial infarction) ([place])    2011 - possibly r/t DVT/embolization?   . Sleep apnea    no c-pap    Past Surgical History:  Procedure Laterality Date  . CARDIAC CATHETERIZATION  10/21/2009   mild bridging(?) segment in mid LAD, otherwise normal coronaries (Dr. Alma Friendly)  . COLONOSCOPY    . INGUINAL HERNIA REPAIR     when 74 months old  . LOWER  EXTREMITY ARTERIAL DOPPLER  2011   normal LEA  . SLEEP STUDY  2011   AHI 8.5 and REM AHI 18  . TRANSTHORACIC ECHOCARDIOGRAM  2011   EF 55-60%, trivial MR, TR, pulm valve regurg  . TUBAL LIGATION  1995  . UPPER GASTROINTESTINAL ENDOSCOPY       Allergies  No Known Allergies  Inpatient Medications    . aspirin  324 mg Oral NOW   Or  . aspirin  300 mg Rectal NOW  . [START ON 01/07/2018] aspirin EC  81 mg Oral Daily  . enoxaparin (LOVENOX) injection  40 mg Subcutaneous Q24H  . ezetimibe-simvastatin  1 tablet Oral q1800  . insulin aspart  0-15 Units Subcutaneous TID WC  . insulin aspart  0-5 Units Subcutaneous QHS  . insulin glargine  15 Units Subcutaneous QHS  . iopamidol      . levothyroxine  200 mcg Oral QAC breakfast  . losartan  50 mg Oral Daily  . metoprolol succinate  25 mg Oral Daily  . pantoprazole  40 mg Oral Q0600  . sodium chloride (PF)        Family History    Family History  Problem Relation Age of Onset  . Lung cancer Father   . Cancer Father   . Diabetes Father   . Other Mother        childbirth - enlarged heart  . Cancer Paternal Grandmother   . Diabetes Paternal Grandmother   . Mental illness Paternal Grandmother   . Hypertension Paternal Grandfather   . Colon cancer Neg Hx    [name] indicated that her mother is deceased. [name] indicated that her father is deceased. [name] indicated that her maternal grandmother is deceased. [name] indicated that her maternal grandfather is deceased. [name] indicated that her paternal grandmother is deceased. [name] indicated that her paternal grandfather is deceased. [name] indicated that the status of her neg hx is unknown.   Social History    Social History   Socioeconomic History  . Marital status: Widowed    Spouse name: Not on file  . Number of children: 2  . Years of education: 95  . Highest education level: Not on file  Occupational History  . Occupation: police department    Employer: [employer]    . Financial resource strain: Not on file  . Food insecurity:    Worry: Not on file    Inability: Not on file  . Transportation needs:    Medical: Not on file    Non-medical: Not on file  Tobacco Use  . Smoking status: Never Smoker  . Smokeless tobacco: Never Used  Substance and Sexual Activity  . Alcohol use: Yes    Alcohol/week: 0.0 standard drinks    Comment: wine  . Drug use: No  . Sexual activity: Not on file  Lifestyle  . Physical activity:    Days per week: Not on file    Minutes per session: Not on file  . Stress: Not on file  Relationships  . Social connections:    Talks on phone: Not on file    Gets together: Not on file    Attends religious service: Not on file    Active member of club or organization: Not on file    Attends meetings of clubs or organizations: Not on file    Relationship status: Not on file  . Intimate partner violence:    Fear of current or ex partner: Not on file    Emotionally abused: Not on file    Physically abused: Not on file    Forced sexual activity: Not on file  Other Topics Concern  . Not on file  Social History Narrative  . Not on file     Review of Systems    Review of Systems  Constitutional: Negative for chills and fever.  HENT: Negative for congestion and sore throat.   Eyes: Negative for blurred vision and double vision.  Respiratory: Positive for cough and shortness of breath. Negative for hemoptysis and sputum production.   Cardiovascular: Positive for chest pain. Negative for palpitations, orthopnea, leg swelling and PND.  Gastrointestinal: Positive for diarrhea (this morning). Negative for abdominal pain, blood in stool, nausea and vomiting.  Genitourinary: Negative for hematuria.  Musculoskeletal: Negative for falls and myalgias.  Neurological: Negative for dizziness, tingling and loss of consciousness.  Endo/Heme/Allergies: Does not bruise/bleed easily.  Psychiatric/Behavioral: Negative for suicidal ideas.     Physical Exam    Blood pressure (!) 150/77, pulse 70, temperature 98.1 F (36.7 C), temperature source Oral, resp. rate 20, height 5\' 4"  (1.626 m), weight 84.5 kg, SpO2 100 %.  General: 67 y.o. obese female resting comfortably in no acute distress. Pleasant and cooperative. HEENT: Normal  Neck: Supple. No carotid bruits or JVD appreciated. Lungs: No increased work of breathing. Clear to auscultation bilaterally. No wheezes, rhonchi, or rales. Heart: RRR. Distinct S1 and S2. No murmurs, gallops, or rubs.  Abdomen: Soft, non-distended, and non-tender to palpation. Bowel sounds present.  Extremities: No significant lower extremity edema. Distal pedal pulses and radial pulses 2+ and equal bilaterally. Neuro: Alert and oriented x3. No focal deficits. Moves all extremities spontaneously. Psych: Normal affect.  Labs    Troponin [facility] of Care Test) Recent Labs    01/06/18 0219  TROPIPOC 0.00   No results for input(s): CKTOTAL, CKMB, TROPONINI in the last 72 hours. Lab Results  Component Value Date   WBC 6.2 01/06/2018   HGB 12.8 01/06/2018   HCT 39.5 01/06/2018   MCV 87.0 01/06/2018   PLT 295 01/06/2018    Recent Labs  Lab 01/06/18 0057  NA 137  K 4.3  CL 103  CO2 26  BUN 21  CREATININE 1.25*  CALCIUM 9.9  GLUCOSE 270*   Lab Results  Component Value Date   CHOL  10/22/2009    187        ATP III CLASSIFICATION:  <200     mg/dL   Desirable  200-239  mg/dL   Borderline High  >=240    mg/dL   High          HDL 50 10/22/2009   LDLCALC (H) 10/22/2009    122        Total Cholesterol/HDL:CHD Risk Coronary Heart Disease Risk Table                     Men   Women  1/2 Average Risk   3.4   3.3  Average Risk  5.0   4.4  2 X Average Risk   9.6   7.1  3 X Average Risk  23.4   11.0        Use the calculated Patient Ratio above and the CHD Risk Table to determine the patient's CHD Risk.        ATP III CLASSIFICATION (LDL):  <100     mg/dL   Optimal  100-129   mg/dL   Near or Above                    Optimal  130-159  mg/dL   Borderline  160-189  mg/dL   High  >190     mg/dL   Very High   TRIG 76 10/22/2009   Lab Results  Component Value Date   DDIMER  10/25/2009    0.46        AT THE INHOUSE ESTABLISHED CUTOFF VALUE OF 0.48 ug/mL FEU, THIS ASSAY HAS BEEN DOCUMENTED IN THE LITERATURE TO HAVE A SENSITIVITY AND NEGATIVE PREDICTIVE VALUE OF AT LEAST 98 TO 99%.  THE TEST RESULT SHOULD BE CORRELATED WITH AN ASSESSMENT OF THE CLINICAL PROBABILITY OF DVT / VTE.     Radiology Studies    Dg Chest 2 View  Result Date: 01/06/2018 CLINICAL DATA:  Chest pain EXAM: CHEST - 2 VIEW COMPARISON:  06/10/2017 FINDINGS: The heart size and mediastinal contours are within normal limits. Both lungs are clear. The visualized skeletal structures are unremarkable. IMPRESSION: No active cardiopulmonary disease. Electronically Signed   By: Ulyses Jarred M.D.   On: 01/06/2018 01:48   Ct Angio Chest Pe W Or Wo Contrast  Result Date: 01/06/2018 CLINICAL DATA:  Chest pain for several days radiating down the left arm. EXAM: CT ANGIOGRAPHY CHEST WITH CONTRAST TECHNIQUE: Multidetector CT imaging of the chest was performed using the standard protocol during bolus administration of intravenous contrast. Multiplanar CT image reconstructions and MIPs were obtained to evaluate the vascular anatomy. CONTRAST:  172mL ISOVUE-370 IOPAMIDOL (ISOVUE-370) INJECTION 76% COMPARISON:  None. FINDINGS: Cardiovascular: Good opacification of the central and segmental pulmonary arteries. No focal filling defects. No evidence of significant pulmonary embolus. Normal heart size. No pericardial effusions. Normal caliber thoracic aorta. No aortic dissection. Great vessel origins are patent. Mediastinum/Nodes: No significant lymphadenopathy. Esophagus is decompressed. Lungs/Pleura: Atelectasis in the lung bases. Nodularity adjacent to the left major fissure probably representing inter fissural lymph  node or small fluid collection. No consolidation in the lungs. No pleural effusions. No pneumothorax. Airways are patent. Upper Abdomen: No acute abnormalities identified. Musculoskeletal: Degenerative changes in the spine. No destructive bone lesions. Review of the MIP images confirms the above findings. IMPRESSION: No evidence of significant pulmonary embolus. No evidence of active pulmonary disease. Electronically Signed   By: Lucienne Capers M.D.   On: 01/06/2018 04:56    EKG     EKG: EKG was personally reviewed and demonstrates: normal sinus rhythm with no acute ischemic changes. Telemetry: Telemetry was personally reviewed and demonstrates: sinus rhythm  Cardiac Imaging   Echocardiogram 01/06/2018: Study Conclusions: - Left ventricle: The cavity size was normal. There was mild   concentric hypertrophy. Systolic function was normal. The   estimated ejection fraction was in the range of 60% to 65%. Wall   motion was normal; there were no regional wall motion   abnormalities. The tissue Doppler parameters were normal. Left   ventricular diastolic function parameters were normal. - Aortic root: The aortic root was normal in size. -  Mitral valve: There was mild regurgitation. - Left atrium: The atrium was normal in size. - Right ventricle: Systolic function was normal. - Tricuspid valve: There was trivial regurgitation. - Pulmonic valve: There was no regurgitation. - Pulmonary arteries: Systolic pressure was within the normal   range. - Inferior vena cava: The vessel was normal in size. - Pericardium, extracardiac: There was no pericardial effusion. _______________  Echocardiogram 10/25/2009: Study Conclusions: - LVEF 55-60% with no regional wall motion abnormalities. Left ventricular diastolic function parameters were normal _______________  Cardiac Catheterization 10/21/2009: Findings: 1. There was a mid segment noted in the mid LAD. 2. Otherwise, no significant coronary artery  disease. 3. LVEDP = 20 mmHg. 4. Left ventriculogram - LVEF > 55%, no mitral regurgitation, no wall motion abnormality.   Plan: We will obtain a transthoracic echocardiogram to further evaluate for possible etiology of her non-STEMI. We have discussed restarting Warfarin given her Factor V Leiden deficiency and this can be followed in our office as an outpatient.    Assessment & Plan    1. Chest Pain - Patient presents with intermittent episodes of worsening chest pain usually at rest. Most recent episode of chest pain occurred last night after eating dinner.  - EKG showed no acute ischemic changes. - I-stat troponin negative. Will continue to trend. - Chest CTA negative for pulmonary embolism. - Echo showed LVEF of 60-65% with no regional wall motion abnormalities. - Patient description of chest pain is somewhat atypical. However, [name] does have multiple cardiovascular risk factor. Given normal coronaries on catheterization in 2011, will proceed with Treadmill Myoview tomorrow. Discussed with MD.  2. Factor V Leiden with History of DVTs - Patient previously on Coumadin; however, [name] stopped this on her own around 2012 because after an episode of prolonged bleeding after cutting her finger. - Chest CTA negative for pulmonary embolism.  3. Hypertension - Well controlled at this time. Most recent BP 132/69.  - Continue home medications: Losartan 50mg  daily and Toprol-XL 25mg  daily.  4. Hyperlipidemia - Most recent LDL 130. - Patient currently on Ezetimibe/Simvastatin. - Per Dr. Debara Pickett last office note on 12/11/2017, planning to add Praluent for additional LDL control.   5. Insulin-Dependent DM - Per primary team.  6. CKD Stage III  - SCr 1.25 on admission, which seems to be patient's baseline. - Continue to monitor renal function. - Per primary team.  7. Hypthyroidism - Continue Synthroid per primary team.  Signed, Darreld Mclean, PA-C 01/06/2018, 1:13 PM  For questions or  updates, please contact   Please consult [email or web address] for contact info under Cardiology/STEMI.

## 2018-01-06 NOTE — H&P (Signed)
History and Physical    [name]:[id number] DOB: 1950/03/17 DOA: 01/06/2018  PCP: Velna Hatchet, MD   Patient coming from: Home   Chief Complaint: Chest pain   HPI: [name] is a 67 y.o. female with medical history significant for factor V Leiden with history of DVT no longer on anticoagulation, hypertension, insulin-dependent diabetes mellitus, chronic kidney disease stage III, and hypothyroidism, now presenting to the emergency department for evaluation of chest discomfort.  Patient reports that she developed a pressure sensation in the central chest approximately a week ago that resolved spontaneously within several minutes.  She has gone on to have recurrent episodes of this, becoming more severe last night.  She was at rest last night at time of onset, describes this as a "pressure," localized to the central chest, associated with mild dyspnea, and without nausea or diaphoresis.  She denies any lower extremity swelling or tenderness.  Denies any cough.  No fevers, chills, abdominal pain, or vomiting.  ED Course: Upon arrival to the ED, patient is found to be afebrile, saturating well on room air, and with vitals otherwise normal.  EKG features a sinus rhythm with nonspecific ST abnormality in the lateral leads, similar to prior.  Chest x-ray is negative for acute cardia pulmonary disease.  CTA chest is negative for PE or other acute cardiopulmonary process.  Chemistry panel is notable for a glucose of 270 and creatinine 1.25, similar to priors.  Patient remains hemodynamically stable in the ED. she will be observed for ongoing evaluation and management of chest discomfort.  Review of Systems:  All other systems reviewed and apart from HPI, are negative.  Past Medical History:  Diagnosis Date  . Clotting disorder ([place])   . Constipation    history of  . Diabetes mellitus without complication ([place])   . DVT (deep venous thrombosis) ([place])    2007  . Factor V Leiden  ([place])    deficiency   . GERD (gastroesophageal reflux disease)   . Hypertension   . Hypothyroidism   . Kidney disease    early stage  . NSTEMI (non-ST elevated myocardial infarction) ([place])    2011 - possibly r/t DVT/embolization?   . Sleep apnea    no c-pap    Past Surgical History:  Procedure Laterality Date  . CARDIAC CATHETERIZATION  10/21/2009   mild bridging(?) segment in mid LAD, otherwise normal coronaries (Dr. Alma Friendly)  . COLONOSCOPY    . INGUINAL HERNIA REPAIR     when 55 months old  . LOWER EXTREMITY ARTERIAL DOPPLER  2011   normal LEA  . SLEEP STUDY  2011   AHI 8.5 and REM AHI 18  . TRANSTHORACIC ECHOCARDIOGRAM  2011   EF 55-60%, trivial MR, TR, pulm valve regurg  . TUBAL LIGATION  1995  . UPPER GASTROINTESTINAL ENDOSCOPY       reports that she has never smoked. She has never used smokeless tobacco. She reports that she drinks alcohol. She reports that she does not use drugs.  No Known Allergies  Family History  Problem Relation Age of Onset  . Lung cancer Father   . Cancer Father   . Diabetes Father   . Other Mother        childbirth - enlarged heart  . Cancer Paternal Grandmother   . Diabetes Paternal Grandmother   . Mental illness Paternal Grandmother   . Hypertension Paternal Grandfather   . Colon cancer Neg Hx      Prior  to Admission medications   Medication Sig Start Date End Date Taking? Authorizing Provider  acetaminophen (TYLENOL) 500 MG tablet Take 1,000 mg by mouth every 6 (six) hours as needed for mild pain.   Yes [provider]  aspirin EC 81 MG tablet Take 81 mg by mouth daily.   Yes [provider]  insulin glargine (LANTUS) 100 UNIT/ML injection Inject 25 Units into the skin at bedtime.   Yes [provider]  insulin lispro (HUMALOG) 100 UNIT/ML injection Inject 5-10 Units into the skin 3 (three) times daily with meals.    Yes [provider]  levothyroxine (SYNTHROID, LEVOTHROID) 200 MCG tablet Take  200 mcg by mouth daily before breakfast.    Yes [provider]  losartan (COZAAR) 50 MG tablet TAKE 1 TABLET BY MOUTH EVERY DAY Patient taking differently: Take 50 mg by mouth daily.  11/11/13  Yes Hilty, Nadean Corwin, MD  metFORMIN (GLUCOPHAGE-XR) 500 MG 24 hr tablet Take 1,000 mg by mouth 2 (two) times daily.   Yes [provider]  metoprolol succinate (TOPROL-XL) 25 MG 24 hr tablet TAKE 1 TABLET BY MOUTH EVERY DAY Patient taking differently: Take 25 mg by mouth daily.  11/11/13  Yes Hilty, Nadean Corwin, MD  ezetimibe-simvastatin (VYTORIN) 10-40 MG tablet Take 1 tablet by mouth daily. Patient not taking: Reported on 01/06/2018 07/18/17   Pixie Casino, MD    Physical Exam: Vitals:   01/06/18 0343 01/06/18 0400 01/06/18 0430 01/06/18 0514  BP: (!) 146/71 (!) 143/73 (!) 144/113 139/81  Pulse: 76 71 73 73  Resp: (!) 22 18 14 18   Temp:      TempSrc:      SpO2: 99% 100% 100% 97%  Weight:      Height:         Constitutional: NAD, calm  Eyes: PERTLA, lids and conjunctivae normal ENMT: Mucous membranes are moist. Posterior pharynx clear of any exudate or lesions.   Neck: normal, supple, no masses, no thyromegaly Respiratory: clear to auscultation bilaterally, no wheezing, no crackles. Normal respiratory effort.    Cardiovascular: S1 & S2 heard, regular rate and rhythm. Trace edema distal LE's b/l. Abdomen: No distension, no tenderness, soft. Bowel sounds normal.  Musculoskeletal: no clubbing / cyanosis. No joint deformity upper and lower extremities.   Skin: no significant rashes, lesions, ulcers. Warm, dry, well-perfused. Neurologic: CN 2-12 grossly intact. Sensation intact. Strength 5/5 in all 4 limbs.  Psychiatric: Alert and oriented x 3. Calm, cooperative.    Labs on Admission: I have personally reviewed following labs and imaging studies  CBC: Recent Labs  Lab 01/06/18 0057  WBC 6.2  HGB 12.8  HCT 39.5  MCV 87.0  PLT 063   Basic Metabolic Panel: Recent  Labs  Lab 01/06/18 0057  NA 137  K 4.3  CL 103  CO2 26  GLUCOSE 270*  BUN 21  CREATININE 1.25*  CALCIUM 9.9   GFR: Estimated Creatinine Clearance: 45.2 mL/min (A) (by C-G formula based on SCr of 1.25 mg/dL (H)). Liver Function Tests: No results for input(s): AST, ALT, ALKPHOS, BILITOT, PROT, ALBUMIN in the last 168 hours. No results for input(s): LIPASE, AMYLASE in the last 168 hours. No results for input(s): AMMONIA in the last 168 hours. Coagulation Profile: No results for input(s): INR, PROTIME in the last 168 hours. Cardiac Enzymes: No results for input(s): CKTOTAL, CKMB, CKMBINDEX, TROPONINI in the last 168 hours. BNP (last 3 results) No results for input(s): PROBNP in the last 8760  hours. HbA1C: No results for input(s): HGBA1C in the last 72 hours. CBG: No results for input(s): GLUCAP in the last 168 hours. Lipid Profile: No results for input(s): CHOL, HDL, LDLCALC, TRIG, CHOLHDL, LDLDIRECT in the last 72 hours. Thyroid Function Tests: No results for input(s): TSH, T4TOTAL, FREET4, T3FREE, THYROIDAB in the last 72 hours. Anemia Panel: No results for input(s): VITAMINB12, FOLATE, FERRITIN, TIBC, IRON, RETICCTPCT in the last 72 hours. Urine analysis:    Component Value Date/Time   COLORURINE YELLOW 12/12/2015 0103   APPEARANCEUR CLEAR 12/12/2015 0103   LABSPEC 1.034 (H) 12/12/2015 0103   PHURINE 5.5 12/12/2015 0103   GLUCOSEU >1000 (A) 12/12/2015 0103   HGBUR TRACE (A) 12/12/2015 0103   BILIRUBINUR NEGATIVE 12/12/2015 0103   BILIRUBINUR negative 05/19/2015 1339   KETONESUR NEGATIVE 12/12/2015 0103   PROTEINUR 30 (A) 12/12/2015 0103   UROBILINOGEN 0.2 05/19/2015 1339   NITRITE NEGATIVE 12/12/2015 0103   LEUKOCYTESUR NEGATIVE 12/12/2015 0103   Sepsis Labs: @LABRCNTIP (procalcitonin:4,lacticidven:4) )No results found for this or any previous visit (from the past 240 hour(s)).   Radiological Exams on Admission: Dg Chest 2 View  Result Date:  01/06/2018 CLINICAL DATA:  Chest pain EXAM: CHEST - 2 VIEW COMPARISON:  06/10/2017 FINDINGS: The heart size and mediastinal contours are within normal limits. Both lungs are clear. The visualized skeletal structures are unremarkable. IMPRESSION: No active cardiopulmonary disease. Electronically Signed   By: Ulyses Jarred M.D.   On: 01/06/2018 01:48   Ct Angio Chest Pe W Or Wo Contrast  Result Date: 01/06/2018 CLINICAL DATA:  Chest pain for several days radiating down the left arm. EXAM: CT ANGIOGRAPHY CHEST WITH CONTRAST TECHNIQUE: Multidetector CT imaging of the chest was performed using the standard protocol during bolus administration of intravenous contrast. Multiplanar CT image reconstructions and MIPs were obtained to evaluate the vascular anatomy. CONTRAST:  138mL ISOVUE-370 IOPAMIDOL (ISOVUE-370) INJECTION 76% COMPARISON:  None. FINDINGS: Cardiovascular: Good opacification of the central and segmental pulmonary arteries. No focal filling defects. No evidence of significant pulmonary embolus. Normal heart size. No pericardial effusions. Normal caliber thoracic aorta. No aortic dissection. Great vessel origins are patent. Mediastinum/Nodes: No significant lymphadenopathy. Esophagus is decompressed. Lungs/Pleura: Atelectasis in the lung bases. Nodularity adjacent to the left major fissure probably representing inter fissural lymph node or small fluid collection. No consolidation in the lungs. No pleural effusions. No pneumothorax. Airways are patent. Upper Abdomen: No acute abnormalities identified. Musculoskeletal: Degenerative changes in the spine. No destructive bone lesions. Review of the MIP images confirms the above findings. IMPRESSION: No evidence of significant pulmonary embolus. No evidence of active pulmonary disease. Electronically Signed   By: Lucienne Capers M.D.   On: 01/06/2018 04:56    EKG: Independently reviewed. Sinus rhythm, non-specific ST abnormality in lateral leads, similar to  prior.   Assessment/Plan  1. Chest pain  - Presents with several days of intermittent chest pressure, worse last night while at rest  - She has hx of NSTEMI in 2011 that was suspected secondary to embolism  - Initial troponin is 0.00, CTA negative for PE or other acute cardiopulmonary process, and EKG with ST abnormality laterally that does not appear significantly different than priors  - Continue cardiac monitoring, obtain serial troponin measurements, continue ASA, statin, ARB, and beta-blocker    2. Hypertension  - BP at goal  - Continue Toprol and losartan    3. History of DVT; factor V Leiden  - No longer anticoagulated, previously on warfarin  - CTA chest is  negative for PE    4. Insulin-dependent DM  - A1c was 6.8% remotely  - Managed at home with Lantus 25 units qHS, Humalog 5-10 units TID, and metformin  - Check CBG's and use Lantus with Novolog correctional while in hospital    5. CKD stage III  - SCr is 1.25 on admission, similar to priors    6. Hypothyroidism  - Continue Synthroid    DVT prophylaxis: Lovenox  Code Status: Full  Family Communication: Discussed with patient  Consults called: none Admission status: Observation     Vianne Bulls, MD [facility] Pager [phone]  If 7PM-7AM, please contact night-coverage [email or web address] Password [email or web address]  01/06/2018, 6:00 AM

## 2018-01-06 NOTE — ED Triage Notes (Signed)
Pt reports having chest pain for the last several days worsening last night with radiation into left arm.

## 2018-01-06 NOTE — Procedures (Signed)
Echo attempted. Patient stated that she wanted to eat before food became cold.

## 2018-01-06 NOTE — ED Notes (Signed)
Pt states she would like to be admitted as she has arranged for care for her family member.  Provider notified.

## 2018-01-06 NOTE — ED Notes (Signed)
CT tech at bedside to take pt for CTA.

## 2018-01-07 ENCOUNTER — Ambulatory Visit (HOSPITAL_BASED_OUTPATIENT_CLINIC_OR_DEPARTMENT_OTHER)
Admission: RE | Admit: 2018-01-07 | Discharge: 2018-01-07 | Disposition: A | Discharge status: Home / Self Care | Payer: Medicare Other | Source: Ambulatory Visit | Attending: Student | Admitting: Student

## 2018-01-07 DIAGNOSIS — I252 Old myocardial infarction: Secondary | ICD-10-CM | POA: Diagnosis not present

## 2018-01-07 DIAGNOSIS — E1122 Type 2 diabetes mellitus with diabetic chronic kidney disease: Secondary | ICD-10-CM | POA: Diagnosis not present

## 2018-01-07 DIAGNOSIS — I10 Essential (primary) hypertension: Secondary | ICD-10-CM | POA: Diagnosis not present

## 2018-01-07 DIAGNOSIS — R079 Chest pain, unspecified: Secondary | ICD-10-CM

## 2018-01-07 DIAGNOSIS — I2510 Atherosclerotic heart disease of native coronary artery without angina pectoris: Secondary | ICD-10-CM

## 2018-01-07 DIAGNOSIS — I129 Hypertensive chronic kidney disease with stage 1 through stage 4 chronic kidney disease, or unspecified chronic kidney disease: Secondary | ICD-10-CM | POA: Diagnosis not present

## 2018-01-07 DIAGNOSIS — N183 Chronic kidney disease, stage 3 (moderate): Secondary | ICD-10-CM | POA: Diagnosis not present

## 2018-01-07 LAB — GLUCOSE, CAPILLARY
Glucose-Capillary: 202 mg/dL — ABNORMAL HIGH (ref 70–99)
Glucose-Capillary: 219 mg/dL — ABNORMAL HIGH (ref 70–99)
Glucose-Capillary: 79 mg/dL (ref 70–99)

## 2018-01-07 LAB — LIPID PANEL
Cholesterol: 226 mg/dL — ABNORMAL HIGH (ref 0–200)
HDL: 79 mg/dL (ref >40)
LDL Cholesterol: 135 mg/dL — ABNORMAL HIGH (ref 0–99)
Total CHOL/HDL Ratio: 2.9 RATIO
Triglycerides: 60 mg/dL (ref <150)
VLDL: 12 mg/dL (ref 0–40)

## 2018-01-07 LAB — CBC
HCT: 37.3 % (ref 36.0–46.0)
Hemoglobin: 12.2 g/dL (ref 12.0–15.0)
MCH: 27.8 pg (ref 26.0–34.0)
MCHC: 32.7 g/dL (ref 30.0–36.0)
MCV: 85 fL (ref 80.0–100.0)
Platelets: 268 10*3/uL (ref 150–400)
RBC: 4.39 MIL/uL (ref 3.87–5.11)
RDW: 13.6 % (ref 11.5–15.5)
WBC: 5.3 10*3/uL (ref 4.0–10.5)
nRBC: 0 % (ref 0.0–0.2)

## 2018-01-07 LAB — BASIC METABOLIC PANEL
Anion gap: 8 (ref 5–15)
BUN: 24 mg/dL — ABNORMAL HIGH (ref 8–23)
CO2: 22 mmol/L (ref 22–32)
Calcium: 9.2 mg/dL (ref 8.9–10.3)
Chloride: 103 mmol/L (ref 98–111)
Creatinine, Ser: 0.9 mg/dL (ref 0.44–1.00)
GFR calc Af Amer: >60 mL/min (ref >60)
GFR calc non Af Amer: >60 mL/min (ref >60)
Glucose, Bld: 276 mg/dL — ABNORMAL HIGH (ref 70–99)
Potassium: 4.4 mmol/L (ref 3.5–5.1)
Sodium: 133 mmol/L — ABNORMAL LOW (ref 135–145)

## 2018-01-07 LAB — NM MYOCAR MULTI W/SPECT W/WALL MOTION / EF
Estimated workload: 1 METS
Exercise duration (min): 5 min
Exercise duration (sec): 2 s
Peak HR: 104 {beats}/min
Rest HR: 65 {beats}/min

## 2018-01-07 LAB — TROPONIN I: Troponin I: <0.03 ng/mL (ref <0.03)

## 2018-01-07 MED ORDER — METOPROLOL SUCCINATE ER 50 MG PO TB24: 50.0000 mg | ORAL_TABLET | Freq: Every day | ORAL | Status: DC

## 2018-01-07 MED ORDER — PANTOPRAZOLE SODIUM 40 MG PO TBEC: 40.0000 mg | DELAYED_RELEASE_TABLET | Freq: Every day | ORAL | 0 refills | Status: DC

## 2018-01-07 MED ORDER — REGADENOSON 0.4 MG/5ML IV SOLN
INTRAVENOUS | Status: AC
  Filled 2018-01-07: qty 5

## 2018-01-07 MED ORDER — TECHNETIUM TC 99M TETROFOSMIN IV KIT
30.0000 | PACK | Freq: Once | INTRAVENOUS | Status: AC | PRN
  Administered 2018-01-07: 30 via INTRAVENOUS

## 2018-01-07 MED ORDER — TECHNETIUM TC 99M TETROFOSMIN IV KIT
10.0000 | PACK | Freq: Once | INTRAVENOUS | Status: AC | PRN
  Administered 2018-01-07: 10 via INTRAVENOUS

## 2018-01-07 MED ORDER — REGADENOSON 0.4 MG/5ML IV SOLN
0.4000 mg | Freq: Once | INTRAVENOUS | Status: AC
  Administered 2018-01-07: 0.4 mg via INTRAVENOUS

## 2018-01-07 NOTE — Progress Notes (Signed)
Inpatient Diabetes Program Recommendations  AACE/ADA: New Consensus Statement on Inpatient Glycemic Control (2015)  Target Ranges:  Prepandial:   less than 140 mg/dL      Peak postprandial:   less than 180 mg/dL (1-2 hours)      Critically ill patients:  140 - 180 mg/dL   Lab Results  Component Value Date   GLUCAP 202 (H) 01/07/2018   HGBA1C (H) 10/21/2009    6.8 (NOTE)                                                                       According to the ADA Clinical Practice Recommendations for 2011, when HbA1c is used as a screening test:   >=6.5%   Diagnostic of Diabetes Mellitus           (if abnormal result  is confirmed)  5.7-6.4%   Increased risk of developing Diabetes Mellitus  References:Diagnosis and Classification of Diabetes Mellitus,Diabetes TKWI,0973,53(GDJME 1):S62-S69 and Standards of Medical Care in         Diabetes - 2011,Diabetes Care,2011,34  (Suppl 1):S11-S61.    Review of Glycemic Control  Diabetes history: DM2 Outpatient Diabetes medications: Lantus 25 QHS, Humalog 5-10 units tidwc, metformin 1000 mg bid Current orders for Inpatient glycemic control: Lantus 15 units QHS, Novolog 0-15 units tidwc and hs  No HgbA1C results. FBS 202 this am.  Inpatient Diabetes Program Recommendations:     Increase Lantus to 20 units QHS Add Novolog 4 units tidwc for meal coverage insulin if po intake > 50%. HgbA1C to assess glycemic control PTA  Will continue to follow.  Thank you. Lorenda Peck, RD, LDN, CDE Inpatient Diabetes Coordinator [phone]

## 2018-01-07 NOTE — Progress Notes (Signed)
Progress Note  Patient Name: [name] Date of Encounter: 01/07/2018  Primary Cardiologist: Pixie Casino, MD   Subjective   No significant overnight events. Patient continues to reports some mild left arm soreness this morning. She denies any chest pain or shortness of breath.  She is schedule for Myoview later today.  Inpatient Medications    Scheduled Meds: . aspirin EC  81 mg Oral Daily  . enoxaparin (LOVENOX) injection  40 mg Subcutaneous Q24H  . ezetimibe-simvastatin  1 tablet Oral q1800  . insulin aspart  0-15 Units Subcutaneous TID WC  . insulin aspart  0-5 Units Subcutaneous QHS  . insulin glargine  15 Units Subcutaneous QHS  . levothyroxine  200 mcg Oral QAC breakfast  . losartan  50 mg Oral Daily  . metoprolol succinate  25 mg Oral Daily  . pantoprazole  40 mg Oral Q0600   Continuous Infusions:  PRN Meds: acetaminophen, morphine injection, ondansetron (ZOFRAN) IV   Vital Signs    Vitals:   01/06/18 0905 01/06/18 1300 01/06/18 2130 01/07/18 0605  BP: (!) 150/77 132/69 140/75 (!) 165/81  Pulse: 70 75 73 71  Resp: 20 18 16 16   Temp: 98.1 F (36.7 C) 98.3 F (36.8 C) 98 F (36.7 C) 97.9 F (36.6 C)  TempSrc: Oral Oral Oral Oral  SpO2: 100% 99% 97% 100%  Weight:      Height:        Intake/Output Summary (Last 24 hours) at 01/07/2018 0704 Last data filed at 01/06/2018 1300 Gross per 24 hour  Intake 360 ml  Output -  Net 360 ml   Filed Weights   01/06/18 0055 01/06/18 0800  Weight: 81.6 kg 84.5 kg    Telemetry    Sinus rhythm with heart rate ranging between 60s and 100s bpm. - Personally Reviewed  Physical Exam   GEN: 67 year old obese female resting comfortably. Alert and in no acute distress.   Neck: Supple.  Cardiac: RRR. No murmurs, rubs, or gallops.  Respiratory: No increased work of breathing. Clear to auscultation bilaterally. No wheezes, rhonchi, or rales. GI: Abdomen soft, nontender, non-distended. Bowel sounds present.   MS: No significant lower extremity edema. No deformity. Neuro:  No focal deficits.  Psych: Normal affect.   Labs    Chemistry Recent Labs  Lab 01/06/18 0057 01/07/18 0209  NA 137 133*  K 4.3 4.4  CL 103 103  CO2 26 22  GLUCOSE 270* 276*  BUN 21 24*  CREATININE 1.25* 0.90  CALCIUM 9.9 9.2  GFRNONAA 43* >60  GFRAA 50* >60  ANIONGAP 8 8     Hematology Recent Labs  Lab 01/06/18 0057 01/07/18 0209  WBC 6.2 5.3  RBC 4.54 4.39  HGB 12.8 12.2  HCT 39.5 37.3  MCV 87.0 85.0  MCH 28.2 27.8  MCHC 32.4 32.7  RDW 13.8 13.6  PLT 295 268    Cardiac Enzymes Recent Labs  Lab 01/06/18 1516 01/06/18 1945 01/07/18 0209  TROPONINI <0.03 <0.03 <0.03    Recent Labs  Lab 01/06/18 0219  TROPIPOC 0.00     BNPNo results for input(s): BNP, PROBNP in the last 168 hours.   DDimer No results for input(s): DDIMER in the last 168 hours.   Radiology    Dg Chest 2 View  Result Date: 01/06/2018 CLINICAL DATA:  Chest pain EXAM: CHEST - 2 VIEW COMPARISON:  06/10/2017 FINDINGS: The heart size and mediastinal contours are within normal limits. Both lungs are clear. The  visualized skeletal structures are unremarkable. IMPRESSION: No active cardiopulmonary disease. Electronically Signed   By: Ulyses Jarred M.D.   On: 01/06/2018 01:48   Ct Angio Chest Pe W Or Wo Contrast  Result Date: 01/06/2018 CLINICAL DATA:  Chest pain for several days radiating down the left arm. EXAM: CT ANGIOGRAPHY CHEST WITH CONTRAST TECHNIQUE: Multidetector CT imaging of the chest was performed using the standard protocol during bolus administration of intravenous contrast. Multiplanar CT image reconstructions and MIPs were obtained to evaluate the vascular anatomy. CONTRAST:  194mL ISOVUE-370 IOPAMIDOL (ISOVUE-370) INJECTION 76% COMPARISON:  None. FINDINGS: Cardiovascular: Good opacification of the central and segmental pulmonary arteries. No focal filling defects. No evidence of significant pulmonary embolus.  Normal heart size. No pericardial effusions. Normal caliber thoracic aorta. No aortic dissection. Great vessel origins are patent. Mediastinum/Nodes: No significant lymphadenopathy. Esophagus is decompressed. Lungs/Pleura: Atelectasis in the lung bases. Nodularity adjacent to the left major fissure probably representing inter fissural lymph node or small fluid collection. No consolidation in the lungs. No pleural effusions. No pneumothorax. Airways are patent. Upper Abdomen: No acute abnormalities identified. Musculoskeletal: Degenerative changes in the spine. No destructive bone lesions. Review of the MIP images confirms the above findings. IMPRESSION: No evidence of significant pulmonary embolus. No evidence of active pulmonary disease. Electronically Signed   By: Lucienne Capers M.D.   On: 01/06/2018 04:56    Cardiac Studies   Echocardiogram 01/06/2018: Study Conclusions: - Left ventricle: The cavity size was normal. There was mild concentric hypertrophy. Systolic function was normal. The estimated ejection fraction was in the range of 60% to 65%. Wall motion was normal; there were no regional wall motion abnormalities. The tissue Doppler parameters were normal. Left ventricular diastolic function parameters were normal. - Aortic root: The aortic root was normal in size. - Mitral valve: There was mild regurgitation. - Left atrium: The atrium was normal in size. - Right ventricle: Systolic function was normal. - Tricuspid valve: There was trivial regurgitation. - Pulmonic valve: There was no regurgitation. - Pulmonary arteries: Systolic pressure was within the normal range. - Inferior vena cava: The vessel was normal in size. - Pericardium, extracardiac: There was no pericardial effusion. _______________  Echocardiogram 10/25/2009: Study Conclusions: - LVEF 55-60% with no regional wall motion abnormalities. Left ventricular diastolic function parameters were  normal _______________  Cardiac Catheterization 10/21/2009: Findings: 1. There was a mid segment noted in the mid LAD. 2. Otherwise, no significant coronary artery disease. 3. LVEDP = 20 mmHg. 4. Left ventriculogram - LVEF > 55%, no mitral regurgitation, no wall motion abnormality.   Plan: We will obtain a transthoracic echocardiogram to further evaluate for possible etiology of her non-STEMI. We have discussed restarting Warfarin given her Factor V Leiden deficiency and this can be followed in our office as an outpatient.   Patient Profile     SALLEY BOXLEY is a 67 y.o. female with a history of Factor V Leiden with history DVTs no longer on chronic anticoagulation,  NSTEMI in 2011 felt to be secondary to thrombotic occlusion rather than acute plaque rupture (normal coronaries with some LAD bridging on heart catheterization), hypertension, insulin-dependent diabetes mellitus, sleep apnea non-compliant with CPAP, CKD stage III, and hypothyroidism who is being seen today for the evaluation of chest pain at the request of Dr. Myna Hidalgo.   Assessment & Plan    1. Chest Pain - Patient presents with intermittent episodes of worsening chest pain usually at rest. Most recent episode of chest pain occurred last night  after eating dinner.  - EKG showed no acute ischemic changes. - I-stat troponin negative. Repeat serial troponin negative x3.  - Chest CTA negative for pulmonary embolism. - Echo showed LVEF of 60-65% with no regional wall motion abnormalities. - Patient is scheduled for Treadmill Myoview later today. Will hold beta-blocker prior to test.   2. Factor V Leiden with History of DVTs - Patient previously on Coumadin; however, she stopped this on her own around 2012 because after an episode of prolonged bleeding after cutting her finger. - Chest CTA negative for pulmonary embolism.  3. Hypertension - BP elevated this morning at 165/81. - Currently, on Losartan 50mg  daily and Toprol-XL  25mg  daily. Will hold the Toprol prior to Treadmill Myoview later today.  - Will continue to monitor. May need to adjust medications if BP remains elevated.   4. Hyperlipidemia - LDL 135 on this admission. - Patient currently on Ezetimibe/Simvastatin. - Per Dr. Debara Pickett last office note on 12/11/2017, planning to add Praluent for additional LDL control.   5. Insulin-Dependent DM - Per primary team.  6. CKD Stage III  - Scr 0.90 today (1.25 yesterday on admission). - Continue to monitor renal function. - Per primary team.  7. Hypthyroidism - Continue Synthroid per primary team.  For questions or updates, please contact [place] Please consult [email or web address] for contact info under        Signed, Darreld Mclean, PA-C  01/07/2018, 7:04 AM

## 2018-01-07 NOTE — Discharge Summary (Signed)
Physician Discharge Summary  [name] JKK:[id number] DOB: 10/06/1950 DOA: 01/06/2018  PCP: Velna Hatchet, MD  Admit date: 01/06/2018 Discharge date: 01/07/2018  Admitted From: Home Disposition:  Home  Recommendations for Outpatient Follow-up:  1. Follow up with PCP in 1-2 weeks 2. Please obtain BMP/CBC in one week 3. Need referral to hematologist, for fever evaluation of factor V Leiden and recommendation for anticoagulation.    Discharge Condition:stable CODE STATUS:full code Diet recommendation: Carb Modified   Brief/Interim Summary: HPI: [name] is a 67 y.o. female with medical history significant for factor V Leiden with history of DVT no longer on anticoagulation, hypertension, insulin-dependent diabetes mellitus, chronic kidney disease stage III, and hypothyroidism, now presenting to the emergency department for evaluation of chest discomfort.  Patient reports that she developed a pressure sensation in the central chest approximately a week ago that resolved spontaneously within several minutes.  She has gone on to have recurrent episodes of this, becoming more severe last night.  She was at rest last night at time of onset, describes this as a "pressure," localized to the central chest, associated with mild dyspnea, and without nausea or diaphoresis.  She denies any lower extremity swelling or tenderness.  Denies any cough.  No fevers, chills, abdominal pain, or vomiting.  ED Course: Upon arrival to the ED, patient is found to be afebrile, saturating well on room air, and with vitals otherwise normal.  EKG features a sinus rhythm with nonspecific ST abnormality in the lateral leads, similar to prior.  Chest x-ray is negative for acute cardia pulmonary disease.  CTA chest is negative for PE or other acute cardiopulmonary process.  Chemistry panel is notable for a glucose of 270 and creatinine 1.25, similar to priors.  Patient remains hemodynamically stable in the ED.  she will be observed for ongoing evaluation and management of chest discomfort.   1-chest pain;  Patient was ruled out for MI with negative troponin. She underwent a stress test that was negative. She had a CT angiogram was negative for PE. [name] was started on PPI. She is currently chest pain-free. Fever evaluation by PCP if pain reoccurs.  2-Hypertension; continue with home medications  History of DVT and presumed factor V Leiden; She has been off of anticoagulation. CT angiogram this admission negative for PE.  [name] was advised to follow up with a hematologist for further recommendations, for anticoagulation.  Diabetes, insulin-dependent: Continue with home regimen  Hypothyroid:; Continue with Synthroid.  Discharge Diagnoses:  Principal Problem:   Chest pain Active Problems:   Hypothyroidism   Essential hypertension   Insulin-requiring or dependent type II diabetes mellitus ([place])   History of DVT (deep vein thrombosis)   CKD (chronic kidney disease), stage III [facility])    Discharge Instructions  Discharge Instructions    Diet - low sodium heart healthy   Complete by:  As directed    Increase activity slowly   Complete by:  As directed      Allergies as of 01/07/2018   No Known Allergies     Medication List    TAKE these medications   acetaminophen 500 MG tablet Commonly known as:  TYLENOL Take 1,000 mg by mouth every 6 (six) hours as needed for mild pain.   aspirin EC 81 MG tablet Take 81 mg by mouth daily.   ezetimibe-simvastatin 10-40 MG tablet Commonly known as:  VYTORIN Take 1 tablet by mouth daily.   insulin glargine 100 UNIT/ML injection Commonly known as:  LANTUS  Inject 25 Units into the skin at bedtime.   insulin lispro 100 UNIT/ML injection Commonly known as:  HUMALOG Inject 5-10 Units into the skin 3 (three) times daily with meals.   levothyroxine 200 MCG tablet Commonly known as:  SYNTHROID, LEVOTHROID Take 200 mcg by mouth daily before  breakfast.   losartan 50 MG tablet Commonly known as:  COZAAR TAKE 1 TABLET BY MOUTH EVERY DAY What changed:    how much to take  how to take this  when to take this  additional instructions   metFORMIN 500 MG 24 hr tablet Commonly known as:  GLUCOPHAGE-XR Take 1,000 mg by mouth 2 (two) times daily.   metoprolol succinate 25 MG 24 hr tablet Commonly known as:  TOPROL-XL TAKE 1 TABLET BY MOUTH EVERY DAY What changed:    how much to take  how to take this  when to take this  additional instructions   pantoprazole 40 MG tablet Commonly known as:  PROTONIX Take 1 tablet (40 mg total) by mouth daily at 6 (six) AM. Start taking on:  01/08/2018       No Known Allergies  Consultations:  cardiology   Procedures/Studies: Dg Chest 2 View  Result Date: 01/06/2018 CLINICAL DATA:  Chest pain EXAM: CHEST - 2 VIEW COMPARISON:  06/10/2017 FINDINGS: The heart size and mediastinal contours are within normal limits. Both lungs are clear. The visualized skeletal structures are unremarkable. IMPRESSION: No active cardiopulmonary disease. Electronically Signed   By: Ulyses Jarred M.D.   On: 01/06/2018 01:48   Ct Angio Chest Pe W Or Wo Contrast  Result Date: 01/06/2018 CLINICAL DATA:  Chest pain for several days radiating down the left arm. EXAM: CT ANGIOGRAPHY CHEST WITH CONTRAST TECHNIQUE: Multidetector CT imaging of the chest was performed using the standard protocol during bolus administration of intravenous contrast. Multiplanar CT image reconstructions and MIPs were obtained to evaluate the vascular anatomy. CONTRAST:  18mL ISOVUE-370 IOPAMIDOL (ISOVUE-370) INJECTION 76% COMPARISON:  None. FINDINGS: Cardiovascular: Good opacification of the central and segmental pulmonary arteries. No focal filling defects. No evidence of significant pulmonary embolus. Normal heart size. No pericardial effusions. Normal caliber thoracic aorta. No aortic dissection. Great vessel origins are  patent. Mediastinum/Nodes: No significant lymphadenopathy. Esophagus is decompressed. Lungs/Pleura: Atelectasis in the lung bases. Nodularity adjacent to the left major fissure probably representing inter fissural lymph node or small fluid collection. No consolidation in the lungs. No pleural effusions. No pneumothorax. Airways are patent. Upper Abdomen: No acute abnormalities identified. Musculoskeletal: Degenerative changes in the spine. No destructive bone lesions. Review of the MIP images confirms the above findings. IMPRESSION: No evidence of significant pulmonary embolus. No evidence of active pulmonary disease. Electronically Signed   By: Lucienne Capers M.D.   On: 01/06/2018 04:56   Nm Myocar Multi W/spect W/wall Motion / Ef  Result Date: 01/07/2018  There was no ST segment deviation noted during stress.  No T wave inversion was noted during stress.  The study is normal.  The left ventricular ejection fraction is normal (55-65%).  1. EF 57%, normal wall motion. 2. No significant perfusion defect. Normal study.     Subjective: She is chest pain-free. She reports, being under a lot of stress.   Discharge Exam: Vitals:                           Vitals:   01/06/18 1300 01/06/18 2130 01/07/18 0605 01/07/18 1148  BP: 132/69 140/75 Marland Kitchen)  165/81 (!) 159/80  Pulse: 75 73 71 77  Resp: 18 16 16    Temp: 98.3 F (36.8 C) 98 F (36.7 C) 97.9 F (36.6 C) (!) 97.5 F (36.4 C)  TempSrc: Oral Oral Oral Oral  SpO2: 99% 97% 100%   Weight:      Height:        General: Pt is alert, awake, not in acute distress Cardiovascular: RRR, S1/S2 +, no rubs, no gallops Respiratory: CTA bilaterally, no wheezing, no rhonchi Abdominal: Soft, NT, ND, bowel sounds + Extremities: no edema, no cyanosis    The results of significant diagnostics from this hospitalization (including imaging, microbiology, ancillary and laboratory) are listed below for reference.     Microbiology: No results found  for this or any previous visit (from the past 240 hour(s)).   Labs: BNP (last 3 results) No results for input(s): BNP in the last 8760 hours. Basic Metabolic Panel: Recent Labs  Lab 01/06/18 0057 01/07/18 0209  NA 137 133*  K 4.3 4.4  CL 103 103  CO2 26 22  GLUCOSE 270* 276*  BUN 21 24*  CREATININE 1.25* 0.90  CALCIUM 9.9 9.2   Liver Function Tests: No results for input(s): AST, ALT, ALKPHOS, BILITOT, PROT, ALBUMIN in the last 168 hours. No results for input(s): LIPASE, AMYLASE in the last 168 hours. No results for input(s): AMMONIA in the last 168 hours. CBC: Recent Labs  Lab 01/06/18 0057 01/07/18 0209  WBC 6.2 5.3  HGB 12.8 12.2  HCT 39.5 37.3  MCV 87.0 85.0  PLT 295 268   Cardiac Enzymes: Recent Labs  Lab 01/06/18 1516 01/06/18 1945 01/07/18 0209  TROPONINI <0.03 <0.03 <0.03   BNP: Invalid input(s): POCBNP CBG: Recent Labs  Lab 01/06/18 1640 01/06/18 2136 01/07/18 0758 01/07/18 1212 01/07/18 1649  GLUCAP 101* 275* 202* 219* 79   D-Dimer No results for input(s): DDIMER in the last 72 hours. Hgb A1c No results for input(s): HGBA1C in the last 72 hours. Lipid Profile Recent Labs    01/07/18 0209  CHOL 226*  HDL 79  LDLCALC 135*  TRIG 60  CHOLHDL 2.9   Thyroid function studies No results for input(s): TSH, T4TOTAL, T3FREE, THYROIDAB in the last 72 hours.  Invalid input(s): FREET3 Anemia work up No results for input(s): VITAMINB12, FOLATE, FERRITIN, TIBC, IRON, RETICCTPCT in the last 72 hours. Urinalysis    Component Value Date/Time   COLORURINE YELLOW 12/12/2015 0103   APPEARANCEUR CLEAR 12/12/2015 0103   LABSPEC 1.034 (H) 12/12/2015 0103   PHURINE 5.5 12/12/2015 0103   GLUCOSEU >1000 (A) 12/12/2015 0103   HGBUR TRACE (A) 12/12/2015 0103   BILIRUBINUR NEGATIVE 12/12/2015 0103   BILIRUBINUR negative 05/19/2015 1339   KETONESUR NEGATIVE 12/12/2015 0103   PROTEINUR 30 (A) 12/12/2015 0103   UROBILINOGEN 0.2 05/19/2015 1339   NITRITE  NEGATIVE 12/12/2015 0103   LEUKOCYTESUR NEGATIVE 12/12/2015 0103   Sepsis Labs Invalid input(s): PROCALCITONIN,  WBC,  LACTICIDVEN Microbiology No results found for this or any previous visit (from the past 240 hour(s)).   Time coordinating discharge: 35 minutes  SIGNED:   Elmarie Shiley, MD  [facility] 01/07/2018, 5:13 PM Pager   If 7PM-7AM, please contact night-coverage [email or web address] Password [email or web address]

## 2018-01-07 NOTE — Progress Notes (Signed)
Patient presented for Lexiscan. Tolerated procedure well. Pending final stress imaging result.  

## 2018-01-12 NOTE — Telephone Encounter (Signed)
LMTCB to discuss Praluent - Rx or patient assistance?

## 2018-02-13 ENCOUNTER — Telehealth: Payer: Self-pay | Admitting: Internal Medicine

## 2018-02-13 ENCOUNTER — Encounter: Payer: Self-pay | Admitting: Internal Medicine

## 2018-02-13 NOTE — Telephone Encounter (Signed)
New hem referral received from Claud Kelp, NP for personal hx of other venous thrombosis and embolism. Pt has ben scheduled to see Dr. Walden Field on 1/23 at 1pm. Pt aware to arrive 30 minutes early. Letter mailed.

## 2018-03-12 ENCOUNTER — Inpatient Hospital Stay: Payer: Medicare Other | Admitting: Internal Medicine

## 2018-03-12 ENCOUNTER — Telehealth: Payer: Self-pay

## 2018-03-12 NOTE — Telephone Encounter (Signed)
Received a [place] note that patient called on 03/11/18 to cancel today's appointment. Called patient twice and phone went straight to voicemail both times. Left message for patient to call [phone] if she wishes to reschedule appointment.

## 2018-03-13 ENCOUNTER — Telehealth: Payer: Self-pay | Admitting: Internal Medicine

## 2018-03-13 NOTE — Telephone Encounter (Signed)
Patient did not return patient assistance paperwork for Praluent 150mg /mL in 2019. LM for patient to call back to discuss - Does she wish to try & obtain patient assistance for 2020? Does she wish to take medication?  Her PA expires 06/13/2018

## 2018-03-25 ENCOUNTER — Ambulatory Visit: Payer: Medicare Other | Admitting: Internal Medicine

## 2018-05-18 ENCOUNTER — Other Ambulatory Visit: Payer: Self-pay

## 2018-09-28 DIAGNOSIS — E1165 Type 2 diabetes mellitus with hyperglycemia: Secondary | ICD-10-CM | POA: Diagnosis not present

## 2018-09-28 DIAGNOSIS — Z7189 Other specified counseling: Secondary | ICD-10-CM | POA: Diagnosis not present

## 2018-09-28 DIAGNOSIS — I2510 Atherosclerotic heart disease of native coronary artery without angina pectoris: Secondary | ICD-10-CM | POA: Diagnosis not present

## 2018-09-28 DIAGNOSIS — D6851 Activated protein C resistance: Secondary | ICD-10-CM | POA: Diagnosis not present

## 2018-09-28 DIAGNOSIS — E039 Hypothyroidism, unspecified: Secondary | ICD-10-CM | POA: Diagnosis not present

## 2018-09-29 DIAGNOSIS — E039 Hypothyroidism, unspecified: Secondary | ICD-10-CM | POA: Diagnosis not present

## 2018-09-29 DIAGNOSIS — E1165 Type 2 diabetes mellitus with hyperglycemia: Secondary | ICD-10-CM | POA: Diagnosis not present

## 2018-09-29 DIAGNOSIS — I2510 Atherosclerotic heart disease of native coronary artery without angina pectoris: Secondary | ICD-10-CM | POA: Diagnosis not present

## 2018-10-08 DIAGNOSIS — E118 Type 2 diabetes mellitus with unspecified complications: Secondary | ICD-10-CM | POA: Diagnosis not present

## 2018-10-08 DIAGNOSIS — D6851 Activated protein C resistance: Secondary | ICD-10-CM | POA: Diagnosis not present

## 2018-10-08 DIAGNOSIS — E039 Hypothyroidism, unspecified: Secondary | ICD-10-CM | POA: Diagnosis not present

## 2018-10-08 DIAGNOSIS — Z794 Long term (current) use of insulin: Secondary | ICD-10-CM | POA: Diagnosis not present

## 2018-10-08 DIAGNOSIS — E1165 Type 2 diabetes mellitus with hyperglycemia: Secondary | ICD-10-CM | POA: Diagnosis not present

## 2018-11-23 ENCOUNTER — Other Ambulatory Visit: Payer: Self-pay | Admitting: Internal Medicine

## 2019-01-04 DIAGNOSIS — E039 Hypothyroidism, unspecified: Secondary | ICD-10-CM | POA: Diagnosis not present

## 2019-01-04 DIAGNOSIS — E559 Vitamin D deficiency, unspecified: Secondary | ICD-10-CM | POA: Diagnosis not present

## 2019-01-04 DIAGNOSIS — E785 Hyperlipidemia, unspecified: Secondary | ICD-10-CM | POA: Diagnosis not present

## 2019-01-04 DIAGNOSIS — E1165 Type 2 diabetes mellitus with hyperglycemia: Secondary | ICD-10-CM | POA: Diagnosis not present

## 2019-01-04 DIAGNOSIS — E118 Type 2 diabetes mellitus with unspecified complications: Secondary | ICD-10-CM | POA: Diagnosis not present

## 2019-01-04 DIAGNOSIS — I2510 Atherosclerotic heart disease of native coronary artery without angina pectoris: Secondary | ICD-10-CM | POA: Diagnosis not present

## 2019-01-07 DIAGNOSIS — E0821 Diabetes mellitus due to underlying condition with diabetic nephropathy: Secondary | ICD-10-CM | POA: Diagnosis not present

## 2019-01-07 DIAGNOSIS — E1165 Type 2 diabetes mellitus with hyperglycemia: Secondary | ICD-10-CM | POA: Diagnosis not present

## 2019-01-07 DIAGNOSIS — E039 Hypothyroidism, unspecified: Secondary | ICD-10-CM | POA: Diagnosis not present

## 2019-01-07 DIAGNOSIS — Z794 Long term (current) use of insulin: Secondary | ICD-10-CM | POA: Diagnosis not present

## 2019-01-13 DIAGNOSIS — H40013 Open angle with borderline findings, low risk, bilateral: Secondary | ICD-10-CM | POA: Diagnosis not present

## 2019-01-26 DIAGNOSIS — E785 Hyperlipidemia, unspecified: Secondary | ICD-10-CM | POA: Diagnosis not present

## 2019-01-26 DIAGNOSIS — K219 Gastro-esophageal reflux disease without esophagitis: Secondary | ICD-10-CM | POA: Diagnosis not present

## 2019-01-26 DIAGNOSIS — E139 Other specified diabetes mellitus without complications: Secondary | ICD-10-CM | POA: Diagnosis not present

## 2019-01-26 DIAGNOSIS — I10 Essential (primary) hypertension: Secondary | ICD-10-CM | POA: Diagnosis not present

## 2019-01-26 DIAGNOSIS — E039 Hypothyroidism, unspecified: Secondary | ICD-10-CM | POA: Diagnosis not present

## 2019-03-02 DIAGNOSIS — R7989 Other specified abnormal findings of blood chemistry: Secondary | ICD-10-CM | POA: Diagnosis not present

## 2019-03-02 DIAGNOSIS — E039 Hypothyroidism, unspecified: Secondary | ICD-10-CM | POA: Diagnosis not present

## 2019-04-06 DIAGNOSIS — E0821 Diabetes mellitus due to underlying condition with diabetic nephropathy: Secondary | ICD-10-CM | POA: Diagnosis not present

## 2019-04-06 DIAGNOSIS — I2510 Atherosclerotic heart disease of native coronary artery without angina pectoris: Secondary | ICD-10-CM | POA: Diagnosis not present

## 2019-04-06 DIAGNOSIS — E785 Hyperlipidemia, unspecified: Secondary | ICD-10-CM | POA: Diagnosis not present

## 2019-04-06 DIAGNOSIS — E559 Vitamin D deficiency, unspecified: Secondary | ICD-10-CM | POA: Diagnosis not present

## 2019-04-06 DIAGNOSIS — R7989 Other specified abnormal findings of blood chemistry: Secondary | ICD-10-CM | POA: Diagnosis not present

## 2019-04-06 DIAGNOSIS — E039 Hypothyroidism, unspecified: Secondary | ICD-10-CM | POA: Diagnosis not present

## 2019-04-13 DIAGNOSIS — R809 Proteinuria, unspecified: Secondary | ICD-10-CM | POA: Diagnosis not present

## 2019-04-13 DIAGNOSIS — E039 Hypothyroidism, unspecified: Secondary | ICD-10-CM | POA: Diagnosis not present

## 2019-04-13 DIAGNOSIS — E0821 Diabetes mellitus due to underlying condition with diabetic nephropathy: Secondary | ICD-10-CM | POA: Diagnosis not present

## 2019-04-13 DIAGNOSIS — Z794 Long term (current) use of insulin: Secondary | ICD-10-CM | POA: Diagnosis not present

## 2019-04-13 DIAGNOSIS — E1165 Type 2 diabetes mellitus with hyperglycemia: Secondary | ICD-10-CM | POA: Diagnosis not present

## 2019-05-11 DIAGNOSIS — E039 Hypothyroidism, unspecified: Secondary | ICD-10-CM | POA: Diagnosis not present

## 2019-05-11 DIAGNOSIS — Z794 Long term (current) use of insulin: Secondary | ICD-10-CM | POA: Diagnosis not present

## 2019-05-11 DIAGNOSIS — E1165 Type 2 diabetes mellitus with hyperglycemia: Secondary | ICD-10-CM | POA: Diagnosis not present

## 2019-05-11 DIAGNOSIS — E0821 Diabetes mellitus due to underlying condition with diabetic nephropathy: Secondary | ICD-10-CM | POA: Diagnosis not present

## 2019-05-12 DIAGNOSIS — I2510 Atherosclerotic heart disease of native coronary artery without angina pectoris: Secondary | ICD-10-CM | POA: Diagnosis not present

## 2019-05-12 DIAGNOSIS — Z0000 Encounter for general adult medical examination without abnormal findings: Secondary | ICD-10-CM | POA: Diagnosis not present

## 2019-05-12 DIAGNOSIS — E039 Hypothyroidism, unspecified: Secondary | ICD-10-CM | POA: Diagnosis not present

## 2019-05-12 DIAGNOSIS — I10 Essential (primary) hypertension: Secondary | ICD-10-CM | POA: Diagnosis not present

## 2019-05-12 DIAGNOSIS — I252 Old myocardial infarction: Secondary | ICD-10-CM | POA: Diagnosis not present

## 2019-06-02 ENCOUNTER — Ambulatory Visit: Payer: Medicare Other | Attending: Internal Medicine

## 2019-06-02 DIAGNOSIS — Z20822 Contact with and (suspected) exposure to covid-19: Secondary | ICD-10-CM

## 2019-06-03 LAB — NOVEL CORONAVIRUS, NAA: SARS-CoV-2, NAA: NOT DETECTED

## 2019-06-03 LAB — SARS-COV-2, NAA 2 DAY TAT

## 2019-07-15 DIAGNOSIS — H40013 Open angle with borderline findings, low risk, bilateral: Secondary | ICD-10-CM | POA: Diagnosis not present

## 2019-08-05 DIAGNOSIS — E039 Hypothyroidism, unspecified: Secondary | ICD-10-CM | POA: Diagnosis not present

## 2019-08-05 DIAGNOSIS — E1165 Type 2 diabetes mellitus with hyperglycemia: Secondary | ICD-10-CM | POA: Diagnosis not present

## 2019-09-08 DIAGNOSIS — Z20822 Contact with and (suspected) exposure to covid-19: Secondary | ICD-10-CM | POA: Diagnosis not present

## 2019-11-03 ENCOUNTER — Ambulatory Visit: Payer: BLUE CROSS/BLUE SHIELD | Admitting: Podiatry

## 2020-01-24 DIAGNOSIS — Z20822 Contact with and (suspected) exposure to covid-19: Secondary | ICD-10-CM | POA: Diagnosis not present

## 2020-02-02 ENCOUNTER — Encounter (HOSPITAL_COMMUNITY): Payer: Self-pay

## 2020-02-02 ENCOUNTER — Inpatient Hospital Stay (HOSPITAL_COMMUNITY)
Admission: EM | Admit: 2020-02-02 | Discharge: 2020-02-08 | DRG: 871 | Disposition: A | Discharge status: Home / Self Care | Payer: Medicare Other | Attending: Internal Medicine | Admitting: Internal Medicine

## 2020-02-02 ENCOUNTER — Other Ambulatory Visit: Payer: Self-pay

## 2020-02-02 ENCOUNTER — Emergency Department (HOSPITAL_COMMUNITY): Payer: Medicare Other

## 2020-02-02 DIAGNOSIS — Z79890 Hormone replacement therapy (postmenopausal): Secondary | ICD-10-CM

## 2020-02-02 DIAGNOSIS — I10 Essential (primary) hypertension: Secondary | ICD-10-CM | POA: Diagnosis not present

## 2020-02-02 DIAGNOSIS — Z801 Family history of malignant neoplasm of trachea, bronchus and lung: Secondary | ICD-10-CM | POA: Diagnosis not present

## 2020-02-02 DIAGNOSIS — D6851 Activated protein C resistance: Secondary | ICD-10-CM | POA: Diagnosis not present

## 2020-02-02 DIAGNOSIS — E876 Hypokalemia: Secondary | ICD-10-CM | POA: Diagnosis not present

## 2020-02-02 DIAGNOSIS — E039 Hypothyroidism, unspecified: Secondary | ICD-10-CM | POA: Diagnosis present

## 2020-02-02 DIAGNOSIS — Y92239 Unspecified place in hospital as the place of occurrence of the external cause: Secondary | ICD-10-CM | POA: Diagnosis not present

## 2020-02-02 DIAGNOSIS — J9601 Acute respiratory failure with hypoxia: Secondary | ICD-10-CM | POA: Diagnosis not present

## 2020-02-02 DIAGNOSIS — U071 COVID-19: Secondary | ICD-10-CM | POA: Diagnosis not present

## 2020-02-02 DIAGNOSIS — Z7984 Long term (current) use of oral hypoglycemic drugs: Secondary | ICD-10-CM

## 2020-02-02 DIAGNOSIS — E1310 Other specified diabetes mellitus with ketoacidosis without coma: Secondary | ICD-10-CM | POA: Diagnosis not present

## 2020-02-02 DIAGNOSIS — Z833 Family history of diabetes mellitus: Secondary | ICD-10-CM

## 2020-02-02 DIAGNOSIS — I252 Old myocardial infarction: Secondary | ICD-10-CM

## 2020-02-02 DIAGNOSIS — N1831 Chronic kidney disease, stage 3a: Secondary | ICD-10-CM | POA: Diagnosis present

## 2020-02-02 DIAGNOSIS — I129 Hypertensive chronic kidney disease with stage 1 through stage 4 chronic kidney disease, or unspecified chronic kidney disease: Secondary | ICD-10-CM | POA: Diagnosis not present

## 2020-02-02 DIAGNOSIS — R059 Cough, unspecified: Secondary | ICD-10-CM | POA: Diagnosis not present

## 2020-02-02 DIAGNOSIS — E785 Hyperlipidemia, unspecified: Secondary | ICD-10-CM | POA: Diagnosis present

## 2020-02-02 DIAGNOSIS — T380X5A Adverse effect of glucocorticoids and synthetic analogues, initial encounter: Secondary | ICD-10-CM | POA: Diagnosis not present

## 2020-02-02 DIAGNOSIS — J1282 Pneumonia due to coronavirus disease 2019: Secondary | ICD-10-CM | POA: Diagnosis present

## 2020-02-02 DIAGNOSIS — E1165 Type 2 diabetes mellitus with hyperglycemia: Secondary | ICD-10-CM | POA: Diagnosis not present

## 2020-02-02 DIAGNOSIS — R7989 Other specified abnormal findings of blood chemistry: Secondary | ICD-10-CM | POA: Diagnosis present

## 2020-02-02 DIAGNOSIS — Z79899 Other long term (current) drug therapy: Secondary | ICD-10-CM

## 2020-02-02 DIAGNOSIS — Z7982 Long term (current) use of aspirin: Secondary | ICD-10-CM | POA: Diagnosis not present

## 2020-02-02 DIAGNOSIS — R0902 Hypoxemia: Secondary | ICD-10-CM

## 2020-02-02 DIAGNOSIS — E1110 Type 2 diabetes mellitus with ketoacidosis without coma: Secondary | ICD-10-CM | POA: Diagnosis present

## 2020-02-02 DIAGNOSIS — G4730 Sleep apnea, unspecified: Secondary | ICD-10-CM | POA: Diagnosis present

## 2020-02-02 DIAGNOSIS — Z86718 Personal history of other venous thrombosis and embolism: Secondary | ICD-10-CM | POA: Diagnosis not present

## 2020-02-02 DIAGNOSIS — A4189 Other specified sepsis: Principal | ICD-10-CM | POA: Diagnosis present

## 2020-02-02 DIAGNOSIS — N179 Acute kidney failure, unspecified: Secondary | ICD-10-CM | POA: Diagnosis not present

## 2020-02-02 DIAGNOSIS — E11649 Type 2 diabetes mellitus with hypoglycemia without coma: Secondary | ICD-10-CM | POA: Diagnosis not present

## 2020-02-02 DIAGNOSIS — E875 Hyperkalemia: Secondary | ICD-10-CM | POA: Diagnosis present

## 2020-02-02 DIAGNOSIS — I712 Thoracic aortic aneurysm, without rupture: Secondary | ICD-10-CM | POA: Diagnosis not present

## 2020-02-02 DIAGNOSIS — Z5329 Procedure and treatment not carried out because of patient's decision for other reasons: Secondary | ICD-10-CM | POA: Diagnosis not present

## 2020-02-02 DIAGNOSIS — R531 Weakness: Secondary | ICD-10-CM | POA: Diagnosis not present

## 2020-02-02 DIAGNOSIS — J159 Unspecified bacterial pneumonia: Secondary | ICD-10-CM | POA: Diagnosis present

## 2020-02-02 DIAGNOSIS — R911 Solitary pulmonary nodule: Secondary | ICD-10-CM | POA: Diagnosis not present

## 2020-02-02 DIAGNOSIS — K219 Gastro-esophageal reflux disease without esophagitis: Secondary | ICD-10-CM | POA: Diagnosis present

## 2020-02-02 DIAGNOSIS — R000 Tachycardia, unspecified: Secondary | ICD-10-CM | POA: Diagnosis not present

## 2020-02-02 DIAGNOSIS — Z794 Long term (current) use of insulin: Secondary | ICD-10-CM

## 2020-02-02 DIAGNOSIS — E1122 Type 2 diabetes mellitus with diabetic chronic kidney disease: Secondary | ICD-10-CM | POA: Diagnosis present

## 2020-02-02 DIAGNOSIS — Z8249 Family history of ischemic heart disease and other diseases of the circulatory system: Secondary | ICD-10-CM

## 2020-02-02 LAB — COMPREHENSIVE METABOLIC PANEL
ALT: 18 U/L (ref 0–44)
AST: 32 U/L (ref 15–41)
Albumin: 3.4 g/dL — ABNORMAL LOW (ref 3.5–5.0)
Alkaline Phosphatase: 90 U/L (ref 38–126)
Anion gap: 30 — ABNORMAL HIGH (ref 5–15)
BUN: 47 mg/dL — ABNORMAL HIGH (ref 8–23)
CO2: 9 mmol/L — ABNORMAL LOW (ref 22–32)
Calcium: 9.4 mg/dL (ref 8.9–10.3)
Chloride: 95 mmol/L — ABNORMAL LOW (ref 98–111)
Creatinine, Ser: 2.01 mg/dL — ABNORMAL HIGH (ref 0.44–1.00)
GFR, Estimated: 26 mL/min — ABNORMAL LOW (ref >60)
Glucose, Bld: 787 mg/dL (ref 70–99)
Potassium: 6.2 mmol/L — ABNORMAL HIGH (ref 3.5–5.1)
Sodium: 134 mmol/L — ABNORMAL LOW (ref 135–145)
Total Bilirubin: 1.4 mg/dL — ABNORMAL HIGH (ref 0.3–1.2)
Total Protein: 8.2 g/dL — ABNORMAL HIGH (ref 6.5–8.1)

## 2020-02-02 LAB — FIBRINOGEN: Fibrinogen: 737 mg/dL — ABNORMAL HIGH (ref 210–475)

## 2020-02-02 LAB — URINALYSIS, ROUTINE W REFLEX MICROSCOPIC
Bilirubin Urine: NEGATIVE
Glucose, UA: >=500 mg/dL — AB
Ketones, ur: 80 mg/dL — AB
Leukocytes,Ua: NEGATIVE
Nitrite: NEGATIVE
Protein, ur: 30 mg/dL — AB
Specific Gravity, Urine: 1.02 (ref 1.005–1.030)
pH: 5 (ref 5.0–8.0)

## 2020-02-02 LAB — CBC WITH DIFFERENTIAL/PLATELET
Abs Immature Granulocytes: 0.16 10*3/uL — ABNORMAL HIGH (ref 0.00–0.07)
Basophils Absolute: 0 10*3/uL (ref 0.0–0.1)
Basophils Relative: 0 %
Eosinophils Absolute: 0 10*3/uL (ref 0.0–0.5)
Eosinophils Relative: 0 %
HCT: 43.3 % (ref 36.0–46.0)
Hemoglobin: 14 g/dL (ref 12.0–15.0)
Immature Granulocytes: 1 %
Lymphocytes Relative: 7 %
Lymphs Abs: 0.8 10*3/uL (ref 0.7–4.0)
MCH: 28.1 pg (ref 26.0–34.0)
MCHC: 32.3 g/dL (ref 30.0–36.0)
MCV: 86.8 fL (ref 80.0–100.0)
Monocytes Absolute: 0.9 10*3/uL (ref 0.1–1.0)
Monocytes Relative: 8 %
Neutro Abs: 10 10*3/uL — ABNORMAL HIGH (ref 1.7–7.7)
Neutrophils Relative %: 84 %
Platelets: 399 10*3/uL (ref 150–400)
RBC: 4.99 MIL/uL (ref 3.87–5.11)
RDW: 15.9 % — ABNORMAL HIGH (ref 11.5–15.5)
WBC: 12 10*3/uL — ABNORMAL HIGH (ref 4.0–10.5)
nRBC: 0 % (ref 0.0–0.2)

## 2020-02-02 LAB — BLOOD GAS, VENOUS
Acid-base deficit: 20.3 mmol/L — ABNORMAL HIGH (ref 0.0–2.0)
Bicarbonate: 8.7 mmol/L — ABNORMAL LOW (ref 20.0–28.0)
O2 Saturation: 57.7 %
Patient temperature: 98.6
pCO2, Ven: 29 mmHg — ABNORMAL LOW (ref 44.0–60.0)
pH, Ven: 7.103 — CL (ref 7.250–7.430)
pO2, Ven: 40.9 mmHg (ref 32.0–45.0)

## 2020-02-02 LAB — FERRITIN: Ferritin: 827 ng/mL — ABNORMAL HIGH (ref 11–307)

## 2020-02-02 LAB — TRIGLYCERIDES: Triglycerides: 306 mg/dL — ABNORMAL HIGH (ref <150)

## 2020-02-02 LAB — BASIC METABOLIC PANEL
Anion gap: 20 — ABNORMAL HIGH (ref 5–15)
BUN: 35 mg/dL — ABNORMAL HIGH (ref 8–23)
CO2: 11 mmol/L — ABNORMAL LOW (ref 22–32)
Calcium: 8.9 mg/dL (ref 8.9–10.3)
Chloride: 110 mmol/L (ref 98–111)
Creatinine, Ser: 1.59 mg/dL — ABNORMAL HIGH (ref 0.44–1.00)
GFR, Estimated: 35 mL/min — ABNORMAL LOW (ref >60)
Glucose, Bld: 327 mg/dL — ABNORMAL HIGH (ref 70–99)
Potassium: 5.2 mmol/L — ABNORMAL HIGH (ref 3.5–5.1)
Sodium: 141 mmol/L (ref 135–145)

## 2020-02-02 LAB — PROCALCITONIN: Procalcitonin: 2.03 ng/mL

## 2020-02-02 LAB — BETA-HYDROXYBUTYRIC ACID: Beta-Hydroxybutyric Acid: 6.28 mmol/L — ABNORMAL HIGH (ref 0.05–0.27)

## 2020-02-02 LAB — RESP PANEL BY RT-PCR (FLU A&B, COVID) ARPGX2
Influenza A by PCR: NEGATIVE
Influenza B by PCR: NEGATIVE
SARS Coronavirus 2 by RT PCR: POSITIVE — AB

## 2020-02-02 LAB — LACTIC ACID, PLASMA
Lactic Acid, Venous: 3.3 mmol/L (ref 0.5–1.9)
Lactic Acid, Venous: 2 mmol/L (ref 0.5–1.9)

## 2020-02-02 LAB — CBG MONITORING, ED
Glucose-Capillary: >600 mg/dL (ref 70–99)
Glucose-Capillary: >600 mg/dL (ref 70–99)
Glucose-Capillary: >600 mg/dL (ref 70–99)
Glucose-Capillary: 597 mg/dL (ref 70–99)
Glucose-Capillary: 541 mg/dL (ref 70–99)
Glucose-Capillary: >600 mg/dL (ref 70–99)
Glucose-Capillary: 490 mg/dL — ABNORMAL HIGH (ref 70–99)
Glucose-Capillary: 278 mg/dL — ABNORMAL HIGH (ref 70–99)
Glucose-Capillary: 438 mg/dL — ABNORMAL HIGH (ref 70–99)
Glucose-Capillary: 169 mg/dL — ABNORMAL HIGH (ref 70–99)

## 2020-02-02 LAB — C-REACTIVE PROTEIN: CRP: 25.3 mg/dL — ABNORMAL HIGH (ref <1.0)

## 2020-02-02 LAB — LACTATE DEHYDROGENASE: LDH: 436 U/L — ABNORMAL HIGH (ref 98–192)

## 2020-02-02 LAB — D-DIMER, QUANTITATIVE: D-Dimer, Quant: 2.08 ug/mL-FEU — ABNORMAL HIGH (ref 0.00–0.50)

## 2020-02-02 MED ORDER — SODIUM CHLORIDE 0.9 % IV SOLN
1000.0000 mL | INTRAVENOUS | Status: DC
  Administered 2020-02-02: 1000 mL via INTRAVENOUS

## 2020-02-02 MED ORDER — INSULIN REGULAR(HUMAN) IN NACL 100-0.9 UT/100ML-% IV SOLN
INTRAVENOUS | Status: DC
  Administered 2020-02-02: 7.5 [IU]/h via INTRAVENOUS
  Filled 2020-02-02: qty 100

## 2020-02-02 MED ORDER — ONDANSETRON HCL 4 MG/2ML IJ SOLN
4.0000 mg | Freq: Four times a day (QID) | INTRAMUSCULAR | Status: DC | PRN
  Administered 2020-02-03: 4 mg via INTRAVENOUS
  Filled 2020-02-02 (×2): qty 2

## 2020-02-02 MED ORDER — LACTATED RINGERS IV BOLUS: 20.0000 mL/kg | Freq: Once | INTRAVENOUS | Status: DC

## 2020-02-02 MED ORDER — HYDROCOD POLST-CPM POLST ER 10-8 MG/5ML PO SUER: 5.0000 mL | Freq: Two times a day (BID) | ORAL | Status: DC | PRN

## 2020-02-02 MED ORDER — ASCORBIC ACID 500 MG PO TABS
500.0000 mg | ORAL_TABLET | Freq: Every day | ORAL | Status: DC
  Administered 2020-02-03 – 2020-02-08 (×6): 500 mg via ORAL
  Filled 2020-02-02 (×6): qty 1

## 2020-02-02 MED ORDER — SODIUM CHLORIDE 0.9 % IV SOLN
500.0000 mg | Freq: Once | INTRAVENOUS | Status: AC
  Administered 2020-02-02: 500 mg via INTRAVENOUS
  Filled 2020-02-02: qty 500

## 2020-02-02 MED ORDER — METOPROLOL TARTRATE 5 MG/5ML IV SOLN
5.0000 mg | Freq: Four times a day (QID) | INTRAVENOUS | Status: DC | PRN
  Administered 2020-02-02: 5 mg via INTRAVENOUS
  Filled 2020-02-02: qty 5

## 2020-02-02 MED ORDER — ACETAMINOPHEN 650 MG RE SUPP: 650.0000 mg | Freq: Four times a day (QID) | RECTAL | Status: DC | PRN

## 2020-02-02 MED ORDER — SODIUM CHLORIDE 0.9 % IV SOLN
1.0000 g | Freq: Once | INTRAVENOUS | Status: AC
  Administered 2020-02-02: 1 g via INTRAVENOUS
  Filled 2020-02-02: qty 10

## 2020-02-02 MED ORDER — GUAIFENESIN-DM 100-10 MG/5ML PO SYRP
10.0000 mL | ORAL_SOLUTION | ORAL | Status: DC | PRN
  Administered 2020-02-05: 10 mL via ORAL
  Filled 2020-02-02: qty 10

## 2020-02-02 MED ORDER — SODIUM CHLORIDE 0.9 % IV SOLN: 500.0000 mg | INTRAVENOUS | Status: DC

## 2020-02-02 MED ORDER — SODIUM CHLORIDE 0.9 % IV SOLN
2.0000 g | INTRAVENOUS | Status: DC
  Administered 2020-02-03 – 2020-02-06 (×4): 2 g via INTRAVENOUS
  Filled 2020-02-02 (×2): qty 20
  Filled 2020-02-02: qty 2
  Filled 2020-02-02: qty 20
  Filled 2020-02-02 (×2): qty 2

## 2020-02-02 MED ORDER — DEXTROSE IN LACTATED RINGERS 5 % IV SOLN: INTRAVENOUS | Status: DC

## 2020-02-02 MED ORDER — ACETAMINOPHEN 325 MG PO TABS
650.0000 mg | ORAL_TABLET | Freq: Four times a day (QID) | ORAL | Status: DC | PRN
  Administered 2020-02-06 – 2020-02-07 (×2): 650 mg via ORAL
  Filled 2020-02-02 (×2): qty 2

## 2020-02-02 MED ORDER — DEXTROSE 50 % IV SOLN
0.0000 mL | INTRAVENOUS | Status: DC | PRN
  Administered 2020-02-07: 50 mL via INTRAVENOUS
  Filled 2020-02-02: qty 50

## 2020-02-02 MED ORDER — ONDANSETRON HCL 4 MG PO TABS: 4.0000 mg | ORAL_TABLET | Freq: Four times a day (QID) | ORAL | Status: DC | PRN

## 2020-02-02 MED ORDER — IPRATROPIUM-ALBUTEROL 20-100 MCG/ACT IN AERS
1.0000 | INHALATION_SPRAY | Freq: Four times a day (QID) | RESPIRATORY_TRACT | Status: DC
  Administered 2020-02-03 (×4): 1 via RESPIRATORY_TRACT
  Filled 2020-02-02: qty 4

## 2020-02-02 MED ORDER — LACTATED RINGERS IV BOLUS
30.0000 mL/kg | Freq: Once | INTRAVENOUS | Status: AC
  Administered 2020-02-02: 2520 mL via INTRAVENOUS

## 2020-02-02 MED ORDER — LACTATED RINGERS IV SOLN: INTRAVENOUS | Status: DC

## 2020-02-02 MED ORDER — ZINC SULFATE 220 (50 ZN) MG PO CAPS
220.0000 mg | ORAL_CAPSULE | Freq: Every day | ORAL | Status: DC
  Administered 2020-02-03 – 2020-02-08 (×6): 220 mg via ORAL
  Filled 2020-02-02 (×6): qty 1

## 2020-02-02 NOTE — H&P (Signed)
History and Physical    [name]:[id number] DOB: 1950-07-14 DOA: 02/02/2020  PCP: [name], DO  Patient coming from: Home  Chief Complaint: body aches.  HPI: [name] is a 69 y.o. female with medical history significant of DM2, HTN, hypothyroidism. Patient is unable to give history. History from her son. He reports that about 12 days ago the patient was having body aches. They were concerned that she might have COVID. So, she took a COVID test and was found positive. She self isolated after talking to her doctor. She had no respiratory symptoms or fever. However, she felt more and more weak/achy over the last 4 days. Her PO intake was poor. She wasn't checking her sugars. This morning she was noted to be vomiting. She called her son to let him know she couldn't keep anything down. He called for EMS to bring her to the ED.    ED Course: She was found to be in DKA. Started on Endotool. She was found to be COVID positive. [facility] was called for admission.   Review of Systems:  Unable to obtain d/t mentation.   PMHx Past Medical History:  Diagnosis Date  . Clotting disorder ([place])   . Constipation    history of  . Diabetes mellitus without complication ([place])   . DVT (deep venous thrombosis) ([place])    2007  . Factor V Leiden ([place])    deficiency   . GERD (gastroesophageal reflux disease)   . Hypertension   . Hypothyroidism   . Kidney disease    early stage  . NSTEMI (non-ST elevated myocardial infarction) ([place])    2011 - possibly r/t DVT/embolization?   . Sleep apnea    no c-pap    PSHx Past Surgical History:  Procedure Laterality Date  . CARDIAC CATHETERIZATION  10/21/2009   mild bridging(?) segment in mid LAD, otherwise normal coronaries (Dr. Alma Friendly)  . COLONOSCOPY    . INGUINAL HERNIA REPAIR     when 71 months old  . LOWER EXTREMITY ARTERIAL DOPPLER  2011   normal LEA  . SLEEP STUDY  2011   AHI 8.5 and REM AHI 18  . TRANSTHORACIC ECHOCARDIOGRAM  2011    EF 55-60%, trivial MR, TR, pulm valve regurg  . TUBAL LIGATION  1995  . UPPER GASTROINTESTINAL ENDOSCOPY      SocHx  reports that she has never smoked. She has never used smokeless tobacco. She reports current alcohol use. She reports that she does not use drugs.  No Known Allergies  FamHx Family History  Problem Relation Age of Onset  . Lung cancer Father   . Cancer Father   . Diabetes Father   . Other Mother        childbirth - enlarged heart  . Cancer Paternal Grandmother   . Diabetes Paternal Grandmother   . Mental illness Paternal Grandmother   . Hypertension Paternal Grandfather   . Colon cancer Neg Hx     Prior to Admission medications   Medication Sig Start Date End Date Taking? Authorizing Provider  acetaminophen (TYLENOL) 500 MG tablet Take 1,000 mg by mouth every 6 (six) hours as needed for mild pain.    [provider]  aspirin EC 81 MG tablet Take 81 mg by mouth daily.    [provider]  ezetimibe-simvastatin (VYTORIN) 10-40 MG tablet TAKE 1 TABLET BY MOUTH EVERY DAY 11/24/18   Hilty, Nadean Corwin, MD  insulin glargine (LANTUS) 100 UNIT/ML injection Inject 25  Units into the skin at bedtime.    [provider]  insulin lispro (HUMALOG) 100 UNIT/ML injection Inject 5-10 Units into the skin 3 (three) times daily with meals.     [provider]  levothyroxine (SYNTHROID, LEVOTHROID) 200 MCG tablet Take 200 mcg by mouth daily before breakfast.     [provider]  losartan (COZAAR) 50 MG tablet TAKE 1 TABLET BY MOUTH EVERY DAY Patient taking differently: Take 50 mg by mouth daily.  11/11/13   Hilty, Nadean Corwin, MD  metFORMIN (GLUCOPHAGE-XR) 500 MG 24 hr tablet Take 1,000 mg by mouth 2 (two) times daily.    [provider]  metoprolol succinate (TOPROL-XL) 25 MG 24 hr tablet TAKE 1 TABLET BY MOUTH EVERY DAY Patient taking differently: Take 25 mg by mouth daily.  11/11/13   Hilty, Nadean Corwin, MD  pantoprazole (PROTONIX) 40  MG tablet Take 1 tablet (40 mg total) by mouth daily at 6 (six) AM. 01/08/18   Elmarie Shiley, MD    Physical Exam: Vitals:   02/02/20 1412 02/02/20 1440 02/02/20 1530 02/02/20 1600  BP:  (!) 182/74 (!) 181/81 (!) 171/69  Pulse:  (!) 115 (!) 118 (!) 118  Resp:  (!) 25 (!) 27 (!) 36  Temp:  98.3 F (36.8 C)    TempSrc:  Oral    SpO2:  94% 100% 100%  Weight: 84 kg     Height: 5\' 4"  (1.626 m)       General: 69 y.o. female resting in bed in NAD Eyes: PERRL, normal sclera ENMT: Nares patent w/o discharge, orophaynx clear, dentition normal, ears w/o discharge/lesions/ulcers Neck: Supple, trachea midline Cardiovascular: tachy, +S1, S2, no m/g/r, equal pulses throughout Respiratory: soft rhonchi right base, no w/r/r, normal WOB on RA GI: BS+, NDNT, no masses noted, no organomegaly noted MSK: No e/c/c Skin: No rashes, bruises, ulcerations noted Neuro: A&O x 2, no focal deficits Psyc: cooperative but confused  Labs on Admission: I have personally reviewed following labs and imaging studies  CBC: Recent Labs  Lab 02/02/20 1502  WBC 12.0*  NEUTROABS 10.0*  HGB 14.0  HCT 43.3  MCV 86.8  PLT 242   Basic Metabolic Panel: Recent Labs  Lab 02/02/20 1502  NA 134*  K 6.2*  CL 95*  CO2 9*  GLUCOSE 787*  BUN 47*  CREATININE 2.01*  CALCIUM 9.4   GFR: Estimated Creatinine Clearance: 27.7 mL/min (A) (by C-G formula based on SCr of 2.01 mg/dL (H)). Liver Function Tests: Recent Labs  Lab 02/02/20 1502  AST 32  ALT 18  ALKPHOS 90  BILITOT 1.4*  PROT 8.2*  ALBUMIN 3.4*   No results for input(s): LIPASE, AMYLASE in the last 168 hours. No results for input(s): AMMONIA in the last 168 hours. Coagulation Profile: No results for input(s): INR, PROTIME in the last 168 hours. Cardiac Enzymes: No results for input(s): CKTOTAL, CKMB, CKMBINDEX, TROPONINI in the last 168 hours. BNP (last 3 results) No results for input(s): PROBNP in the last 8760 hours. HbA1C: No results  for input(s): HGBA1C in the last 72 hours. CBG: Recent Labs  Lab 02/02/20 1429 02/02/20 1646  GLUCAP >600* >600*   Lipid Profile: Recent Labs    02/02/20 1502  TRIG 306*   Thyroid Function Tests: No results for input(s): TSH, T4TOTAL, FREET4, T3FREE, THYROIDAB in the last 72 hours. Anemia Panel: Recent Labs    02/02/20 1515  FERRITIN 827*   Urine analysis:    Component Value Date/Time  COLORURINE YELLOW 02/02/2020 1502   APPEARANCEUR HAZY (A) 02/02/2020 1502   LABSPEC 1.020 02/02/2020 1502   PHURINE 5.0 02/02/2020 1502   GLUCOSEU >=500 (A) 02/02/2020 1502   HGBUR MODERATE (A) 02/02/2020 1502   BILIRUBINUR NEGATIVE 02/02/2020 1502   BILIRUBINUR negative 05/19/2015 1339   KETONESUR 80 (A) 02/02/2020 1502   PROTEINUR 30 (A) 02/02/2020 1502   UROBILINOGEN 0.2 05/19/2015 1339   NITRITE NEGATIVE 02/02/2020 1502   LEUKOCYTESUR NEGATIVE 02/02/2020 1502    Radiological Exams on Admission: DG Chest Port 1 View  Result Date: 02/02/2020 CLINICAL DATA:  Cough, weakness, and chills. Recently tested positive for COVID-19. EXAM: PORTABLE CHEST 1 VIEW COMPARISON:  Chest radiographs and CTA 01/06/2018 FINDINGS: The cardiomediastinal silhouette is within normal limits for portable AP technique. There are interstitial and ill-defined airspace opacities in the mid and lower lungs bilaterally. No sizable pleural effusion or pneumothorax is identified. No acute osseous abnormality is seen. IMPRESSION: Bilateral lung opacities compatible with pneumonia. Electronically Signed   By: Logan Bores M.D.   On: 02/02/2020 14:39   Assessment/Plan DKA DM2     - admit to inpt, stepdown     - endotool: insulin gtt, fluids, q6h BMP     - NPO     - A1c  COVID+ B/l PNA (baterial vs COVID) Sepsis secondary PNA complicated by DKA     - CXR w/ b/l opacities     - procal is elevated (2.03) and she is COVID+     - she is unable to tell us her COVID history at this time     - started on rocephin,  zithro in ED     - spoke with PCCM (Dr. Lake Bells) about how to approach COVID+ result w/ respect to her DKA. At this point she sats 97% - 100% on RA. He recommends covering for bacterial PNA only for next 24h and see what her respiratory status does.      - If she becomes hypoxic we can start steroids and follow     - for now abx, inhalers, anti-tussives  Hyperkalemia     - as a function of DKA     - fluids, insulin, follow  AKI     - fluids, follow UOP  HTN     - hold home meds until able to take PO     - PRN metoprolol  HLD     - hold home meds until able to take PO  Hypothyroidism     - resume synthroid when able to take PO  DVT prophylaxis: SCDs  Code Status: FULL  Family Communication: with son by phone  Consults called: [facility] PCCM (Dr. Lake Bells).   Status is: Inpatient  Remains inpatient appropriate because:Inpatient level of care appropriate due to severity of illness   Dispo: The patient is from: Home              Anticipated d/c is to: Home              Anticipated d/c date is: > 3 days              Patient currently is not medically stable to d/c.  Jonnie Finner DO [facility]  If 7PM-7AM, please contact night-coverage [email or web address]  02/02/2020, 5:05 PM

## 2020-02-02 NOTE — ED Triage Notes (Signed)
covid x 10 days, complaining of weakness, frequent urination, and body aches. [id number].

## 2020-02-02 NOTE — ED Notes (Signed)
Pt confused asking repetitive questions and stating " If you just give me water, my sugar will come down." Reoriented to place and events.

## 2020-02-02 NOTE — ED Provider Notes (Signed)
[facility] DEPT Provider Note   CSN: [id number] Arrival date & time: 02/02/20  1349     History Chief Complaint  Patient presents with  . Covid Positive  . Weakness    GRIZEL VESELY is a 69 y.o. female.  69 year old female presents with weakness as well as polyuria along with fever and shortness of breath.  Patient diagnosed with Covid about 9 days ago.  She is unvaccinated against Covid.  She has not received monoclonal antibody.  Has had emesis at times without associate abdominal discomfort.  Slight diarrhea.  Weakness has been profound.  She endorses body aches.  Called EMS and patient's initial blood sugar was too high to register.  Repeat was 42.  She does have a history of diabetes.        Past Medical History:  Diagnosis Date  . Clotting disorder ([place])   . Constipation    history of  . Diabetes mellitus without complication ([place])   . DVT (deep venous thrombosis) ([place])    2007  . Factor V Leiden ([place])    deficiency   . GERD (gastroesophageal reflux disease)   . Hypertension   . Hypothyroidism   . Kidney disease    early stage  . NSTEMI (non-ST elevated myocardial infarction) ([place])    2011 - possibly r/t DVT/embolization?   . Sleep apnea    no c-pap    Patient Active Problem List   Diagnosis Date Noted  . Chest pain 01/06/2018  . CKD (chronic kidney disease), stage III ([place]) 01/06/2018  . History of DVT (deep vein thrombosis) 07/18/2017  . Right leg pain 07/18/2017  . Swelling of both lower extremities 07/18/2017  . Dyslipidemia 07/18/2017  . Insulin-requiring or dependent type II diabetes mellitus ([place]) 07/01/2016  . Hx of non-ST elevation myocardial infarction (NSTEMI) 07/01/2016  . Screening for lipid disorders 07/01/2016  . COUGH 11/27/2007  . PHLEBITIS, SUPERFICIAL VEINS, UPPER LIMB 09/29/2006  . VENOUS INSUFFICIENCY, CHRONIC, RIGHT LEG 07/30/2006  . Hypothyroidism 04/17/2006  . Essential hypertension 04/17/2006   . DEEP VEIN THROMBOPHLEBITIS, LEG 04/17/2006    Past Surgical History:  Procedure Laterality Date  . CARDIAC CATHETERIZATION  10/21/2009   mild bridging(?) segment in mid LAD, otherwise normal coronaries (Dr. Alma Friendly)  . COLONOSCOPY    . INGUINAL HERNIA REPAIR     when 4 months old  . LOWER EXTREMITY ARTERIAL DOPPLER  2011   normal LEA  . SLEEP STUDY  2011   AHI 8.5 and REM AHI 18  . TRANSTHORACIC ECHOCARDIOGRAM  2011   EF 55-60%, trivial MR, TR, pulm valve regurg  . TUBAL LIGATION  1995  . UPPER GASTROINTESTINAL ENDOSCOPY       OB History   No obstetric history on file.     Family History  Problem Relation Age of Onset  . Lung cancer Father   . Cancer Father   . Diabetes Father   . Other Mother        childbirth - enlarged heart  . Cancer Paternal Grandmother   . Diabetes Paternal Grandmother   . Mental illness Paternal Grandmother   . Hypertension Paternal Grandfather   . Colon cancer Neg Hx     Social History   Tobacco Use  . Smoking status: Never Smoker  . Smokeless tobacco: Never Used  Substance Use Topics  . Alcohol use: Yes    Alcohol/week: 0.0 standard drinks    Comment: wine  . Drug use: No  Home Medications Prior to Admission medications   Medication Sig Start Date End Date Taking? Authorizing Provider  acetaminophen (TYLENOL) 500 MG tablet Take 1,000 mg by mouth every 6 (six) hours as needed for mild pain.    [provider]  aspirin EC 81 MG tablet Take 81 mg by mouth daily.    [provider]  ezetimibe-simvastatin (VYTORIN) 10-40 MG tablet TAKE 1 TABLET BY MOUTH EVERY DAY 11/24/18   Hilty, Nadean Corwin, MD  insulin glargine (LANTUS) 100 UNIT/ML injection Inject 25 Units into the skin at bedtime.    [provider]  insulin lispro (HUMALOG) 100 UNIT/ML injection Inject 5-10 Units into the skin 3 (three) times daily with meals.     [provider]  levothyroxine (SYNTHROID, LEVOTHROID) 200 MCG tablet Take 200  mcg by mouth daily before breakfast.     [provider]  losartan (COZAAR) 50 MG tablet TAKE 1 TABLET BY MOUTH EVERY DAY Patient taking differently: Take 50 mg by mouth daily.  11/11/13   Hilty, Nadean Corwin, MD  metFORMIN (GLUCOPHAGE-XR) 500 MG 24 hr tablet Take 1,000 mg by mouth 2 (two) times daily.    [provider]  metoprolol succinate (TOPROL-XL) 25 MG 24 hr tablet TAKE 1 TABLET BY MOUTH EVERY DAY Patient taking differently: Take 25 mg by mouth daily.  11/11/13   Hilty, Nadean Corwin, MD  pantoprazole (PROTONIX) 40 MG tablet Take 1 tablet (40 mg total) by mouth daily at 6 (six) AM. 01/08/18   Regalado, Cassie Freer, MD    Allergies    Patient has no known allergies.  Review of Systems   Review of Systems  All other systems reviewed and are negative.   Physical Exam Updated Vital Signs Ht 1.626 m ($Remove'5\' 4"'HZLOicq$ )   Wt 84 kg   BMI 31.79 kg/m   Physical Exam Vitals and nursing note reviewed.  Constitutional:      General: She is not in acute distress.    Appearance: Normal appearance. She is well-developed and well-nourished. She is not toxic-appearing.  HENT:     Head: Normocephalic and atraumatic.  Eyes:     General: Lids are normal.     Extraocular Movements: EOM normal.     Conjunctiva/sclera: Conjunctivae normal.     Pupils: Pupils are equal, round, and reactive to light.  Neck:     Thyroid: No thyroid mass.     Trachea: No tracheal deviation.  Cardiovascular:     Rate and Rhythm: Normal rate and regular rhythm.     Heart sounds: Normal heart sounds. No murmur heard. No gallop.   Pulmonary:     Effort: Pulmonary effort is normal. No respiratory distress.     Breath sounds: Normal breath sounds. No stridor. No decreased breath sounds, wheezing, rhonchi or rales.  Abdominal:     General: Bowel sounds are normal. There is no distension.     Palpations: Abdomen is soft.     Tenderness: There is no abdominal tenderness. There is no CVA tenderness or rebound.   Musculoskeletal:        General: No tenderness or edema. Normal range of motion.     Cervical back: Normal range of motion and neck supple.  Skin:    General: Skin is warm and dry.     Findings: No abrasion or rash.  Neurological:     Mental Status: She is alert and oriented to person, place, and time.     GCS: GCS eye subscore is 4.  GCS verbal subscore is 5. GCS motor subscore is 6.     Cranial Nerves: No cranial nerve deficit.     Sensory: No sensory deficit.     Deep Tendon Reflexes: Strength normal.  Psychiatric:        Attention and Perception: Attention normal.        Mood and Affect: Mood and affect normal. Affect is flat.        Speech: Speech normal.        Behavior: Behavior normal.     ED Results / Procedures / Treatments   Labs (all labs ordered are listed, but only abnormal results are displayed) Labs Reviewed  RESP PANEL BY RT-PCR (FLU A&B, COVID) ARPGX2  CULTURE, BLOOD (ROUTINE X 2)  CULTURE, BLOOD (ROUTINE X 2)  LACTIC ACID, PLASMA  LACTIC ACID, PLASMA  CBC WITH DIFFERENTIAL/PLATELET  COMPREHENSIVE METABOLIC PANEL  D-DIMER, QUANTITATIVE (NOT AT [facility])  PROCALCITONIN  LACTATE DEHYDROGENASE  FERRITIN  TRIGLYCERIDES  FIBRINOGEN  C-REACTIVE PROTEIN  BLOOD GAS, VENOUS  URINALYSIS, ROUTINE W REFLEX MICROSCOPIC  CBG MONITORING, ED    EKG None  Radiology No results found.  Procedures Procedures (including critical care time)  Medications Ordered in ED Medications  0.9 %  sodium chloride infusion (has no administration in time range)    ED Course  I have reviewed the triage vital signs and the nursing notes.  Pertinent labs & imaging results that were available during my care of the patient were reviewed by me and considered in my medical decision making (see chart for details).    MDM Rules/Calculators/A&P                         Patient's blood sugar here is too high to read.  Subsequent c-Met shows blood sugar of 787.  She has been bolused  with IV fluids.  Potassium was 6.2 and patient started on insulin drip.  Patient's anion gap is 30 and consistent with DKA.  VBG also shows metabolic acidosis with a bicarb of 8.7.  Urinalysis shows greater than 80 ketones.  Chest x-ray is consistent with pneumonia likely from Covid.  Will add blood cultures and start empiric antibiotics.  Will admit to the hospitalist service   CRITICAL CARE Performed by: Leota Jacobsen Total critical care time: 50 minutes Critical care time was exclusive of separately billable procedures and treating other patients. Critical care was necessary to treat or prevent imminent or life-threatening deterioration. Critical care was time spent personally by me on the following activities: development of treatment plan with patient and/or surrogate as well as nursing, discussions with consultants, evaluation of patient's response to treatment, examination of patient, obtaining history from patient or surrogate, ordering and performing treatments and interventions, ordering and review of laboratory studies, ordering and review of radiographic studies, pulse oximetry and re-evaluation of patient's condition.  Final Clinical Impression(s) / ED Diagnoses Final diagnoses:  None    Rx / DC Orders ED Discharge Orders    None       Lacretia Leigh, MD 02/02/20 1610

## 2020-02-02 NOTE — ED Notes (Signed)
Pt confused and trying to get out of bed. " I have to pee". Explained to pt that she has a purewick and she can go ahead and pee. Bed alarm placed under pt and turned on.

## 2020-02-02 NOTE — ED Notes (Addendum)
Date and time results received: 02/02/20 10:11 PM  Test: Lactic Acid  Critical Value: 2.0  Name of Provider Notified: Sharlet Salina, NP

## 2020-02-02 NOTE — ED Notes (Signed)
Date and time results received: 02/02/20 1517 (use smartphrase ".now" to insert current time)  Test: Venous Blood Gas Critical Value: PH-7.103  Name of Provider Notified: Dr Zenia Resides  Orders Received? Or Actions Taken?:

## 2020-02-03 ENCOUNTER — Inpatient Hospital Stay (HOSPITAL_COMMUNITY): Payer: Medicare Other

## 2020-02-03 DIAGNOSIS — U071 COVID-19: Secondary | ICD-10-CM

## 2020-02-03 DIAGNOSIS — J1282 Pneumonia due to coronavirus disease 2019: Secondary | ICD-10-CM

## 2020-02-03 DIAGNOSIS — E1310 Other specified diabetes mellitus with ketoacidosis without coma: Secondary | ICD-10-CM

## 2020-02-03 LAB — COMPREHENSIVE METABOLIC PANEL
ALT: 12 U/L (ref 0–44)
ALT: 13 U/L (ref 0–44)
AST: 36 U/L (ref 15–41)
AST: 35 U/L (ref 15–41)
Albumin: 2.6 g/dL — ABNORMAL LOW (ref 3.5–5.0)
Albumin: 2.4 g/dL — ABNORMAL LOW (ref 3.5–5.0)
Alkaline Phosphatase: 71 U/L (ref 38–126)
Alkaline Phosphatase: 64 U/L (ref 38–126)
Anion gap: 14 (ref 5–15)
Anion gap: 10 (ref 5–15)
BUN: 22 mg/dL (ref 8–23)
BUN: 19 mg/dL (ref 8–23)
CO2: 19 mmol/L — ABNORMAL LOW (ref 22–32)
CO2: 17 mmol/L — ABNORMAL LOW (ref 22–32)
Calcium: 9.1 mg/dL (ref 8.9–10.3)
Calcium: 9.1 mg/dL (ref 8.9–10.3)
Chloride: 113 mmol/L — ABNORMAL HIGH (ref 98–111)
Chloride: 116 mmol/L — ABNORMAL HIGH (ref 98–111)
Creatinine, Ser: 1.24 mg/dL — ABNORMAL HIGH (ref 0.44–1.00)
Creatinine, Ser: 1.18 mg/dL — ABNORMAL HIGH (ref 0.44–1.00)
GFR, Estimated: 47 mL/min — ABNORMAL LOW (ref >60)
GFR, Estimated: 50 mL/min — ABNORMAL LOW (ref >60)
Glucose, Bld: 103 mg/dL — ABNORMAL HIGH (ref 70–99)
Glucose, Bld: 107 mg/dL — ABNORMAL HIGH (ref 70–99)
Potassium: 5.1 mmol/L (ref 3.5–5.1)
Potassium: 4.5 mmol/L (ref 3.5–5.1)
Sodium: 146 mmol/L — ABNORMAL HIGH (ref 135–145)
Sodium: 143 mmol/L (ref 135–145)
Total Bilirubin: 0.6 mg/dL (ref 0.3–1.2)
Total Bilirubin: 0.5 mg/dL (ref 0.3–1.2)
Total Protein: 6.5 g/dL (ref 6.5–8.1)
Total Protein: 6.4 g/dL — ABNORMAL LOW (ref 6.5–8.1)

## 2020-02-03 LAB — GLUCOSE, CAPILLARY
Glucose-Capillary: 94 mg/dL (ref 70–99)
Glucose-Capillary: 132 mg/dL — ABNORMAL HIGH (ref 70–99)
Glucose-Capillary: 188 mg/dL — ABNORMAL HIGH (ref 70–99)
Glucose-Capillary: 240 mg/dL — ABNORMAL HIGH (ref 70–99)
Glucose-Capillary: 250 mg/dL — ABNORMAL HIGH (ref 70–99)
Glucose-Capillary: 260 mg/dL — ABNORMAL HIGH (ref 70–99)
Glucose-Capillary: 131 mg/dL — ABNORMAL HIGH (ref 70–99)
Glucose-Capillary: 107 mg/dL — ABNORMAL HIGH (ref 70–99)

## 2020-02-03 LAB — CBC
HCT: 42.1 % (ref 36.0–46.0)
HCT: 36.8 % (ref 36.0–46.0)
Hemoglobin: 14.1 g/dL (ref 12.0–15.0)
Hemoglobin: 12.7 g/dL (ref 12.0–15.0)
MCH: 28.6 pg (ref 26.0–34.0)
MCH: 27.9 pg (ref 26.0–34.0)
MCHC: 33.5 g/dL (ref 30.0–36.0)
MCHC: 34.5 g/dL (ref 30.0–36.0)
MCV: 85.4 fL (ref 80.0–100.0)
MCV: 80.7 fL (ref 80.0–100.0)
Platelets: 264 10*3/uL (ref 150–400)
Platelets: 276 10*3/uL (ref 150–400)
RBC: 4.93 MIL/uL (ref 3.87–5.11)
RBC: 4.56 MIL/uL (ref 3.87–5.11)
RDW: 15.7 % — ABNORMAL HIGH (ref 11.5–15.5)
RDW: 14.5 % (ref 11.5–15.5)
WBC: 10.1 10*3/uL (ref 4.0–10.5)
WBC: 10.1 10*3/uL (ref 4.0–10.5)
nRBC: 0 % (ref 0.0–0.2)
nRBC: 0 % (ref 0.0–0.2)

## 2020-02-03 LAB — HEMOGLOBIN A1C
Hgb A1c MFr Bld: 11.6 % — ABNORMAL HIGH (ref 4.8–5.6)
Mean Plasma Glucose: 286.22 mg/dL

## 2020-02-03 LAB — BASIC METABOLIC PANEL
Anion gap: 9 (ref 5–15)
BUN: 28 mg/dL — ABNORMAL HIGH (ref 8–23)
CO2: 19 mmol/L — ABNORMAL LOW (ref 22–32)
Calcium: 8.7 mg/dL — ABNORMAL LOW (ref 8.9–10.3)
Chloride: 115 mmol/L — ABNORMAL HIGH (ref 98–111)
Creatinine, Ser: 1.17 mg/dL — ABNORMAL HIGH (ref 0.44–1.00)
GFR, Estimated: 51 mL/min — ABNORMAL LOW (ref >60)
Glucose, Bld: 81 mg/dL (ref 70–99)
Potassium: 4.2 mmol/L (ref 3.5–5.1)
Sodium: 143 mmol/L (ref 135–145)

## 2020-02-03 LAB — CBG MONITORING, ED
Glucose-Capillary: 108 mg/dL — ABNORMAL HIGH (ref 70–99)
Glucose-Capillary: 139 mg/dL — ABNORMAL HIGH (ref 70–99)
Glucose-Capillary: 187 mg/dL — ABNORMAL HIGH (ref 70–99)
Glucose-Capillary: 93 mg/dL (ref 70–99)

## 2020-02-03 LAB — BLOOD GAS, ARTERIAL
Acid-base deficit: 1.9 mmol/L (ref 0.0–2.0)
Bicarbonate: 20.9 mmol/L (ref 20.0–28.0)
Drawn by: 308601
O2 Saturation: 94.6 %
Patient temperature: 98.6
pCO2 arterial: 31.5 mmHg — ABNORMAL LOW (ref 32.0–48.0)
pH, Arterial: 7.437 (ref 7.350–7.450)
pO2, Arterial: 71.8 mmHg — ABNORMAL LOW (ref 83.0–108.0)

## 2020-02-03 LAB — D-DIMER, QUANTITATIVE: D-Dimer, Quant: >20 ug/mL-FEU — ABNORMAL HIGH (ref 0.00–0.50)

## 2020-02-03 LAB — PHOSPHORUS: Phosphorus: 1.7 mg/dL — ABNORMAL LOW (ref 2.5–4.6)

## 2020-02-03 LAB — CORTISOL-AM, BLOOD: Cortisol - AM: 31.5 ug/dL — ABNORMAL HIGH (ref 6.7–22.6)

## 2020-02-03 LAB — BETA-HYDROXYBUTYRIC ACID
Beta-Hydroxybutyric Acid: 1.22 mmol/L — ABNORMAL HIGH (ref 0.05–0.27)
Beta-Hydroxybutyric Acid: 0.61 mmol/L — ABNORMAL HIGH (ref 0.05–0.27)

## 2020-02-03 LAB — FERRITIN: Ferritin: 601 ng/mL — ABNORMAL HIGH (ref 11–307)

## 2020-02-03 LAB — HIV ANTIBODY (ROUTINE TESTING W REFLEX): HIV Screen 4th Generation wRfx: NONREACTIVE

## 2020-02-03 LAB — MAGNESIUM: Magnesium: 2.1 mg/dL (ref 1.7–2.4)

## 2020-02-03 LAB — PROTIME-INR
INR: 1 (ref 0.8–1.2)
Prothrombin Time: 13.1 seconds (ref 11.4–15.2)

## 2020-02-03 LAB — MRSA PCR SCREENING: MRSA by PCR: NEGATIVE

## 2020-02-03 LAB — PROCALCITONIN: Procalcitonin: 1.92 ng/mL

## 2020-02-03 LAB — C-REACTIVE PROTEIN: CRP: 19 mg/dL — ABNORMAL HIGH (ref <1.0)

## 2020-02-03 MED ORDER — LOSARTAN POTASSIUM 50 MG PO TABS
50.0000 mg | ORAL_TABLET | Freq: Every day | ORAL | Status: DC
  Administered 2020-02-03 – 2020-02-05 (×3): 50 mg via ORAL
  Filled 2020-02-03 (×3): qty 1

## 2020-02-03 MED ORDER — EZETIMIBE-SIMVASTATIN 10-40 MG PO TABS
1.0000 | ORAL_TABLET | Freq: Every day | ORAL | Status: DC
  Administered 2020-02-03 – 2020-02-07 (×5): 1 via ORAL
  Filled 2020-02-03 (×7): qty 1

## 2020-02-03 MED ORDER — K PHOS MONO-SOD PHOS DI & MONO 155-852-130 MG PO TABS
250.0000 mg | ORAL_TABLET | Freq: Two times a day (BID) | ORAL | Status: AC
  Administered 2020-02-03 (×2): 250 mg via ORAL
  Filled 2020-02-03 (×3): qty 1

## 2020-02-03 MED ORDER — IOHEXOL 350 MG/ML SOLN
100.0000 mL | Freq: Once | INTRAVENOUS | Status: AC | PRN
  Administered 2020-02-03: 80 mL via INTRAVENOUS

## 2020-02-03 MED ORDER — LEVOTHYROXINE SODIUM 100 MCG PO TABS
200.0000 ug | ORAL_TABLET | Freq: Every day | ORAL | Status: DC
  Administered 2020-02-04 – 2020-02-08 (×5): 200 ug via ORAL
  Filled 2020-02-03 (×5): qty 2

## 2020-02-03 MED ORDER — INSULIN GLARGINE 100 UNIT/ML ~~LOC~~ SOLN
20.0000 [IU] | Freq: Once | SUBCUTANEOUS | Status: AC
  Administered 2020-02-03: 20 [IU] via SUBCUTANEOUS
  Filled 2020-02-03: qty 0.2

## 2020-02-03 MED ORDER — IPRATROPIUM-ALBUTEROL 20-100 MCG/ACT IN AERS
1.0000 | INHALATION_SPRAY | Freq: Two times a day (BID) | RESPIRATORY_TRACT | Status: DC
  Administered 2020-02-04 – 2020-02-08 (×10): 1 via RESPIRATORY_TRACT
  Filled 2020-02-03: qty 4

## 2020-02-03 MED ORDER — ENOXAPARIN SODIUM 40 MG/0.4ML ~~LOC~~ SOLN
40.0000 mg | SUBCUTANEOUS | Status: DC
  Administered 2020-02-03 – 2020-02-07 (×5): 40 mg via SUBCUTANEOUS
  Filled 2020-02-03 (×5): qty 0.4

## 2020-02-03 MED ORDER — PANTOPRAZOLE SODIUM 40 MG PO TBEC
40.0000 mg | DELAYED_RELEASE_TABLET | Freq: Every day | ORAL | Status: DC
  Administered 2020-02-03 – 2020-02-08 (×6): 40 mg via ORAL
  Filled 2020-02-03 (×6): qty 1

## 2020-02-03 MED ORDER — ASPIRIN EC 81 MG PO TBEC
81.0000 mg | DELAYED_RELEASE_TABLET | Freq: Every day | ORAL | Status: DC
  Administered 2020-02-03 – 2020-02-08 (×6): 81 mg via ORAL
  Filled 2020-02-03 (×6): qty 1

## 2020-02-03 MED ORDER — DEXAMETHASONE 4 MG PO TABS
6.0000 mg | ORAL_TABLET | Freq: Every day | ORAL | Status: DC
  Administered 2020-02-03 – 2020-02-08 (×6): 6 mg via ORAL
  Filled 2020-02-03: qty 3
  Filled 2020-02-03: qty 2
  Filled 2020-02-03: qty 3
  Filled 2020-02-03: qty 2
  Filled 2020-02-03: qty 3
  Filled 2020-02-03: qty 2

## 2020-02-03 MED ORDER — INSULIN ASPART 100 UNIT/ML ~~LOC~~ SOLN
0.0000 [IU] | Freq: Three times a day (TID) | SUBCUTANEOUS | Status: DC
  Administered 2020-02-03: 3 [IU] via SUBCUTANEOUS
  Administered 2020-02-03: 5 [IU] via SUBCUTANEOUS

## 2020-02-03 MED ORDER — INSULIN GLARGINE 100 UNIT/ML ~~LOC~~ SOLN
20.0000 [IU] | Freq: Every day | SUBCUTANEOUS | Status: DC
  Administered 2020-02-04: 20 [IU] via SUBCUTANEOUS
  Filled 2020-02-03: qty 0.2

## 2020-02-03 MED ORDER — CHLORHEXIDINE GLUCONATE CLOTH 2 % EX PADS
6.0000 | MEDICATED_PAD | Freq: Every day | CUTANEOUS | Status: DC
  Administered 2020-02-03 – 2020-02-08 (×5): 6 via TOPICAL

## 2020-02-03 MED ORDER — INSULIN ASPART 100 UNIT/ML ~~LOC~~ SOLN
0.0000 [IU] | Freq: Every day | SUBCUTANEOUS | Status: DC
  Administered 2020-02-07: 4 [IU] via SUBCUTANEOUS

## 2020-02-03 MED ORDER — METOPROLOL SUCCINATE ER 25 MG PO TB24
25.0000 mg | ORAL_TABLET | Freq: Every day | ORAL | Status: DC
  Administered 2020-02-03 – 2020-02-08 (×6): 25 mg via ORAL
  Filled 2020-02-03 (×6): qty 1

## 2020-02-03 MED ORDER — AZITHROMYCIN 250 MG PO TABS
500.0000 mg | ORAL_TABLET | Freq: Every day | ORAL | Status: DC
  Administered 2020-02-03 – 2020-02-06 (×4): 500 mg via ORAL
  Filled 2020-02-03 (×4): qty 2

## 2020-02-03 NOTE — ED Notes (Signed)
Dr Sharlet Salina called back stating that pt needs to remain on insulin drip until bicarb increases, so patient will need to remain in ICU until that time.

## 2020-02-03 NOTE — ED Notes (Signed)
Called Tori RN and informed that waiting to hear back from Dr, Sharlet Salina. Tori messaged Dr Sharlet Salina as well to see about downgrading pt's bed status now that Anion gap and CBG 93. Waiting to hear back from MD.

## 2020-02-03 NOTE — Progress Notes (Signed)
PHARMACIST - PHYSICIAN COMMUNICATION  CONCERNING: Antibiotic IV to Oral Route Change Policy  RECOMMENDATION: This patient is receiving azithromycin by the intravenous route.  Based on criteria approved by the Pharmacy and Therapeutics Committee, the antibiotic(s) is/are being converted to the equivalent oral dose form(s).   DESCRIPTION: These criteria include:  Patient being treated for a respiratory tract infection, urinary tract infection, cellulitis or clostridium difficile associated diarrhea if on metronidazole  The patient is not neutropenic and does not exhibit a GI malabsorption state  The patient is eating (either orally or via tube) and/or has been taking other orally administered medications for a least 24 hours  The patient is improving clinically and has a Tmax < 100.5  If you have questions about this conversion, please contact the Pharmacy Department  []  ( [phone] )  [facility] []  ( [phone] )  [facility] []  ( [phone] )  [facility] []  ( [phone] )  [facility] [x]  ( [phone] )  [facility]  

## 2020-02-03 NOTE — ED Notes (Addendum)
Pt's IVs will not pull back blood and this RN attempted to straight stick for blood draw, but was unsuccessful. Phlebotomy consulted at this time.

## 2020-02-03 NOTE — TOC Initial Note (Signed)
Transition of Care [facility]) - Initial/Assessment Note    Patient Details  Name: [name] MRN: [id number] Date of Birth: [birth year]  [facility] CM/SW Contact:    Leeroy Cha, RN Phone Number: 02/03/2020, 1:22 PM  Clinical Narrative:                 69 y.o. female with medical history significant of DM2, HTN, hypothyroidism. Patient is unable to give history. History from her son. He reports that about 12 days ago the patient was having body aches. They were concerned that she might have COVID. So, she took a COVID test and was found positive. She self isolated after talking to her doctor. She had no respiratory symptoms or fever. However, she felt more and more weak/achy over the last 4 days. Her PO intake was poor. She wasn't checking her sugars. This morning she was noted to be vomiting. She called her son to let him know she couldn't keep anything down. He called for EMS to bring her to the ED.    ED Course: She was found to be in DKA. Started on Endotool. She was found to be COVID positive. TRH was called for admission.  PLAN: to return to homwe with self care has family support in the area. Following progression.O@ at 2l/min,Iv rocephin, iv d5lr at 125cc/hr,iv lrat125cc/hr,  A1C 11.6. Expected Discharge Plan: Home/Self Care Barriers to Discharge: No Barriers Identified   Patient Goals and CMS Choice Patient states their goals for this hospitalization and ongoing recovery are:: to go home CMS Medicare.gov Compare Post Acute Care list provided to:: Patient Choice offered to / list presented to : Patient  Expected Discharge Plan and Services Expected Discharge Plan: Home/Self Care   Discharge Planning Services: CM Consult   Living arrangements for the past 2 months: Single Family Home                                      Prior Living Arrangements/Services Living arrangements for the past 2 months: Single Family Home Lives with:: Self Patient  language and need for interpreter reviewed:: Yes Do you feel safe going back to the place where you live?: Yes      Need for Family Participation in Patient Care: Yes (Comment) Care giver support system in place?: Yes (comment)   Criminal Activity/Legal Involvement Pertinent to Current Situation/Hospitalization: No - Comment as needed  Activities of Daily Living Home Assistive Devices/Equipment: CBG Meter (per previous hx) ADL Screening (condition at time of admission) Patient's cognitive ability adequate to safely complete daily activities?: No Is the patient deaf or have difficulty hearing?: No (per previous hx) Does the patient have difficulty seeing, even when wearing glasses/contacts?: No (per previous hx) Does the patient have difficulty concentrating, remembering, or making decisions?: Yes Patient able to express need for assistance with ADLs?: No Does the patient have difficulty dressing or bathing?: Yes Independently performs ADLs?: No Communication: Needs assistance Is this a change from baseline?: Pre-admission baseline Dressing (OT): Needs assistance Is this a change from baseline?: Pre-admission baseline Grooming: Needs assistance Is this a change from baseline?: Pre-admission baseline Feeding: Needs assistance Is this a change from baseline?: Pre-admission baseline Bathing: Needs assistance Is this a change from baseline?: Pre-admission baseline Toileting: Needs assistance Is this a change from baseline?: Pre-admission baseline In/Out Bed: Needs assistance Is this a change from baseline?: Pre-admission baseline Walks in Home:  Needs assistance Is this a change from baseline?: Pre-admission baseline Does the patient have difficulty walking or climbing stairs?: Yes Weakness of Legs: Both Weakness of Arms/Hands: Both  Permission Sought/Granted                  Emotional Assessment Appearance:: Appears stated age Attitude/Demeanor/Rapport: Engaged Affect  (typically observed): Calm Orientation: : Oriented to Place,Oriented to Self,Oriented to  Time,Oriented to Situation Alcohol / Substance Use: Not Applicable Psych Involvement: No (comment)  Admission diagnosis:  DKA (diabetic ketoacidosis) ([place]) [E11.10] Diabetic ketoacidosis without coma associated with other specified diabetes mellitus ([place]) [E13.10] Patient Active Problem List   Diagnosis Date Noted  . DKA (diabetic ketoacidosis) ([place]) 02/02/2020  . Chest pain 01/06/2018  . CKD (chronic kidney disease), stage III ([place]) 01/06/2018  . History of DVT (deep vein thrombosis) 07/18/2017  . Right leg pain 07/18/2017  . Swelling of both lower extremities 07/18/2017  . Dyslipidemia 07/18/2017  . Insulin-requiring or dependent type II diabetes mellitus ([place]) 07/01/2016  . Hx of non-ST elevation myocardial infarction (NSTEMI) 07/01/2016  . Screening for lipid disorders 07/01/2016  . COUGH 11/27/2007  . PHLEBITIS, SUPERFICIAL VEINS, UPPER LIMB 09/29/2006  . VENOUS INSUFFICIENCY, CHRONIC, RIGHT LEG 07/30/2006  . Hypothyroidism 04/17/2006  . Essential hypertension 04/17/2006  . DEEP VEIN THROMBOPHLEBITIS, LEG 04/17/2006   PCP:  [name], DO Pharmacy:   [id number] - [place], [place] [address] Phone: [phone] Fax: [phone]     Social Determinants of Health (SDOH) Interventions    Readmission Risk Interventions No flowsheet data found.

## 2020-02-03 NOTE — Progress Notes (Signed)
Assisted tele visit to patient with family member.  Sundus Pete D Eldwin Volkov, RN   

## 2020-02-03 NOTE — ED Notes (Addendum)
Around 0130, RN sent blood work to lab for [place] and BMP orders. Around 0215, RN noticed labs had not started processing and/or resulted. RN spoke with lab and they stated they never received the blood work. RN will attempt to recollect labs.

## 2020-02-03 NOTE — Progress Notes (Signed)
ANTICOAGULATION CONSULT NOTE - Initial Consult  Pharmacy Consult for LMWH Indication: VTE prophylaxis  No Known Allergies  Patient Measurements: Height: 5\' 4"  (162.6 cm) Weight: 84 kg (185 lb 3 oz) IBW/kg (Calculated) : 54.7 Heparin Dosing Weight:   Vital Signs: Temp: 99.5 F (37.5 C) (12/16 1200) Temp Source: Oral (12/16 1200) BP: 171/84 (12/16 1200) Pulse Rate: 97 (12/16 1200)  Labs: Recent Labs    02/02/20 1502 02/02/20 2123 02/03/20 0237 02/03/20 0643  HGB 14.0  --   --  14.1  HCT 43.3  --   --  42.1  PLT 399  --   --  264  LABPROT  --   --   --  13.1  INR  --   --   --  1.0  CREATININE 2.01* 1.59* 1.17* 1.24*    Estimated Creatinine Clearance: 44.9 mL/min (A) (by C-G formula based on SCr of 1.24 mg/dL (H)).   Medical History: Past Medical History:  Diagnosis Date  . Clotting disorder ([place])   . Constipation    history of  . Diabetes mellitus without complication ([place])   . DVT (deep venous thrombosis) ([place])    2007  . Factor V Leiden ([place])    deficiency   . GERD (gastroesophageal reflux disease)   . Hypertension   . Hypothyroidism   . Kidney disease    early stage  . NSTEMI (non-ST elevated myocardial infarction) ([place])    2011 - possibly r/t DVT/embolization?   . Sleep apnea    no c-pap   Assessment: Wt 84 kg, CrCl > 30 ml/min.  BMI ~ 32.    Plan:  LMWH 40 qday for VTE px Pharmacy to sign off  Eudelia Bunch, Pharm.D 02/03/2020 1:52 PM

## 2020-02-03 NOTE — Progress Notes (Signed)
PROGRESS NOTE    [name]  OEU:[id number] DOB: [birth year] DOA: 02/02/2020 PCP: [name], DO   Brief Narrative: Taken from H&P.  [name] is a 69 y.o. female with medical history significant of DM2, HTN, hypothyroidism. Patient is unable to give history. History from her son. He reports that about 12 days ago the patient was having body aches. They were concerned that she might have COVID. So, she took a COVID test and was found positive. She self isolated after talking to her doctor. She had no respiratory symptoms or fever. However, she felt more and more weak/achy over the last 4 days. Her PO intake was poor. She wasn't checking her sugars. This morning she was noted to be vomiting. She called her son to let him know she couldn't keep anything down. He called for EMS to bring her to the ED.   She was found to be in DKA and started on Endo tool.  COVID-19 PCR was positive. Procalcitonin was also elevated.  Chest x-ray with bilateral infiltrate consistent with atypical/viral pneumonia.  She was started on antibiotics.  Solu-Medrol was deferred as there was no hypoxia.  Subjective: Patient was asking for some water when seen during morning rounds.  She was not talking much but denies any nausea or vomiting.  She did not answer my questions regarding vaccination.  Assessment & Plan:   Active Problems:   DKA (diabetic ketoacidosis) ([place])  Uncontrolled diabetes mellitus with DKA.  She was started on Endo tool, gap closed x2 this morning.  A1c of 11.6.  Not sure about the compliance. -Start her on Lantus and sliding scale. -Transition off from insulin infusion. -Continue to monitor. -Continue with IV fluid as she still appears dry and bicarbs are still 19.  COVID-19 pneumonia.  Patient with recent COVID-19 infection.  Not much upper respiratory symptoms but she started desaturating while in the hospital. Procalcitonin was elevated at 1.92 today, on admission it was 2.03.   MRSA swab was negative.  Elevated inflammatory markers with some improvement in CRP today, marked worsening of D-dimer. -Get CTA to rule out PE. -Start her on Decadron 6 mg daily. -Continue with ceftriaxone and Zithromax-to complete a 5-day course. -Continue with the supportive care. -Supplemental oxygen to keep the saturation above 90%.  AKI.  Most likely had some CKD at baseline with uncontrolled diabetes.  Creatinine improved to 1.24 today. -Continue to monitor -Avoid nephrotoxins.  Hypophosphatemia.  Phosphate low at 1.8, magnesium within normal limit at 2.1. -Replete phosphorus and monitor.  Hyperkalemia.  Resolved  Hypertension.  Pressure within goal. -Restart home meds.  Hyperlipidemia. -Restart home dose of statin.  Hypothyroidism. -Continue home dose of Synthroid.  Objective: Vitals:   02/03/20 0515 02/03/20 0530 02/03/20 0600 02/03/20 0800  BP: 106/61 107/63    Pulse: 86 86    Resp: (!) 27 (!) 26    Temp:  98.4 F (36.9 C) (!) 97.5 F (36.4 C) 98.4 F (36.9 C)  TempSrc:  Oral Oral Oral  SpO2: 97% 97%    Weight:      Height:        Intake/Output Summary (Last 24 hours) at 02/03/2020 0816 Last data filed at 02/03/2020 0426 Gross per 24 hour  Intake 453.84 ml  Output --  Net 453.84 ml   Filed Weights   02/02/20 1412  Weight: 84 kg    Examination:  General exam: Appears calm and comfortable  Respiratory system: Clear to auscultation. Respiratory effort normal. Cardiovascular system:  S1 & S2 heard, RRR. Gastrointestinal system: Soft, nontender, nondistended, bowel sounds positive. Central nervous system: Alert and oriented. No focal neurological deficits. Extremities: No edema, no cyanosis, pulses intact and symmetrical. Psychiatry: Judgement and insight appear normal.   DVT prophylaxis: Lovenox Code Status: Full Family Communication: Son was updated on phone Disposition Plan:  Status is: Inpatient  Remains inpatient appropriate  because:Inpatient level of care appropriate due to severity of illness   Dispo: The patient is from: Home              Anticipated d/c is to: Home              Anticipated d/c date is: 1 day              Patient currently is not medically stable to d/c.   Consultants:   None  Procedures:  Antimicrobials:  Ceftriaxone Zithromax  Data Reviewed: I have personally reviewed following labs and imaging studies  CBC: Recent Labs  Lab 02/02/20 1502 02/03/20 0643  WBC 12.0* 10.1  NEUTROABS 10.0*  --   HGB 14.0 14.1  HCT 43.3 42.1  MCV 86.8 85.4  PLT 399 283   Basic Metabolic Panel: Recent Labs  Lab 02/02/20 1502 02/02/20 2123 02/03/20 0237 02/03/20 0643  NA 134* 141 143 146*  K 6.2* 5.2* 4.2 5.1  CL 95* 110 115* 113*  CO2 9* 11* 19* 19*  GLUCOSE 787* 327* 81 103*  BUN 47* 35* 28* 22  CREATININE 2.01* 1.59* 1.17* 1.24*  CALCIUM 9.4 8.9 8.7* 9.1  MG  --   --   --  2.1  PHOS  --   --   --  1.7*   GFR: Estimated Creatinine Clearance: 44.9 mL/min (A) (by C-G formula based on SCr of 1.24 mg/dL (H)). Liver Function Tests: Recent Labs  Lab 02/02/20 1502 02/03/20 0643  AST 32 36  ALT 18 12  ALKPHOS 90 71  BILITOT 1.4* 0.6  PROT 8.2* 6.5  ALBUMIN 3.4* 2.6*   No results for input(s): LIPASE, AMYLASE in the last 168 hours. No results for input(s): AMMONIA in the last 168 hours. Coagulation Profile: No results for input(s): INR, PROTIME in the last 168 hours. Cardiac Enzymes: No results for input(s): CKTOTAL, CKMB, CKMBINDEX, TROPONINI in the last 168 hours. BNP (last 3 results) No results for input(s): PROBNP in the last 8760 hours. HbA1C: No results for input(s): HGBA1C in the last 72 hours. CBG: Recent Labs  Lab 02/03/20 0124 02/03/20 0252 02/03/20 0417 02/03/20 0608 02/03/20 0748  GLUCAP 139* 108* 93 94 132*   Lipid Profile: Recent Labs    02/02/20 1502  TRIG 306*   Thyroid Function Tests: No results for input(s): TSH, T4TOTAL, FREET4, T3FREE,  THYROIDAB in the last 72 hours. Anemia Panel: Recent Labs    02/02/20 1515 02/03/20 0643  FERRITIN 827* 601*   Sepsis Labs: Recent Labs  Lab 02/02/20 1502 02/02/20 1515 02/02/20 2123 02/03/20 0643  PROCALCITON 2.03  --   --  1.92  LATICACIDVEN  --  3.3* 2.0*  --     Recent Results (from the past 240 hour(s))  Resp Panel by RT-PCR (Flu A&B, Covid) Nasopharyngeal Swab     Status: Abnormal   Collection Time: 02/02/20  3:15 PM   Specimen: Nasopharyngeal Swab; Nasopharyngeal(NP) swabs in vial transport medium  Result Value Ref Range Status   SARS Coronavirus 2 by RT PCR POSITIVE (A) NEGATIVE Final    Comment: RESULT CALLED TO, READ BACK BY  AND VERIFIED WITH: WEST,S. RN @1735  ON 12.15.2021 BY COHEN,K (NOTE) SARS-CoV-2 target nucleic acids are DETECTED.  The SARS-CoV-2 RNA is generally detectable in upper respiratory specimens during the acute phase of infection. Positive results are indicative of the presence of the identified virus, but do not rule out bacterial infection or co-infection with other pathogens not detected by the test. Clinical correlation with patient history and other diagnostic information is necessary to determine patient infection status. The expected result is Negative.  Fact Sheet for Patients: EntrepreneurPulse.com.au  Fact Sheet for Healthcare Providers: IncredibleEmployment.be  This test is not yet approved or cleared by the [place] FDA and  has been authorized for detection and/or diagnosis of SARS-CoV-2 by FDA under an Emergency Use Authorization (EUA).  This EUA will remain in effect (meaning this tes t can be used) for the duration of  the COVID-19 declaration under Section 564(b)(1) of the Act, 21 U.S.C. section 360bbb-3(b)(1), unless the authorization is terminated or revoked sooner.     Influenza A by PCR NEGATIVE NEGATIVE Final   Influenza B by PCR NEGATIVE NEGATIVE Final    Comment:  (NOTE) The Xpert Xpress SARS-CoV-2/FLU/RSV plus assay is intended as an aid in the diagnosis of influenza from Nasopharyngeal swab specimens and should not be used as a sole basis for treatment. Nasal washings and aspirates are unacceptable for Xpert Xpress SARS-CoV-2/FLU/RSV testing.  Fact Sheet for Patients: EntrepreneurPulse.com.au  Fact Sheet for Healthcare Providers: IncredibleEmployment.be  This test is not yet approved or cleared by the [place] FDA and has been authorized for detection and/or diagnosis of SARS-CoV-2 by FDA under an Emergency Use Authorization (EUA). This EUA will remain in effect (meaning this test can be used) for the duration of the COVID-19 declaration under Section 564(b)(1) of the Act, 21 U.S.C. section 360bbb-3(b)(1), unless the authorization is terminated or revoked.  Performed at [facility], [place] [address], [place], [place] [address]      Radiology Studies: [facility] Chest Port 1 View  Result Date: 02/02/2020 CLINICAL DATA:  Cough, weakness, and chills. Recently tested positive for COVID-19. EXAM: PORTABLE CHEST 1 VIEW COMPARISON:  Chest radiographs and CTA 01/06/2018 FINDINGS: The cardiomediastinal silhouette is within normal limits for portable AP technique. There are interstitial and ill-defined airspace opacities in the mid and lower lungs bilaterally. No sizable pleural effusion or pneumothorax is identified. No acute osseous abnormality is seen. IMPRESSION: Bilateral lung opacities compatible with pneumonia. Electronically Signed   By: Logan Bores M.D.   On: 02/02/2020 14:39    Scheduled Meds: . vitamin C  500 mg Oral Daily  . Chlorhexidine Gluconate Cloth  6 each Topical Daily  . Ipratropium-Albuterol  1 puff Inhalation Q6H  . zinc sulfate  220 mg Oral Daily   Continuous Infusions: . sodium chloride Stopped (02/02/20 1613)  . azithromycin    . cefTRIAXone (ROCEPHIN)  IV    .  dextrose 5% lactated ringers 125 mL/hr at 02/02/20 2258  . insulin 0.5 Units/hr (02/03/20 0516)  . lactated ringers Stopped (02/02/20 2250)     LOS: 1 day   Time spent: 40 minutes  Lorella Nimrod, MD [facility]  If 7PM-7AM, please contact night-coverage [email or web address]  02/03/2020, 8:16 AM   This record has been created using Systems analyst. Errors have been sought and corrected,but may not always be located. Such creation errors do not reflect on the standard of care.

## 2020-02-04 ENCOUNTER — Encounter (HOSPITAL_COMMUNITY): Payer: Self-pay | Admitting: Internal Medicine

## 2020-02-04 ENCOUNTER — Inpatient Hospital Stay (HOSPITAL_COMMUNITY): Payer: Medicare Other

## 2020-02-04 LAB — FERRITIN: Ferritin: 520 ng/mL — ABNORMAL HIGH (ref 11–307)

## 2020-02-04 LAB — COMPREHENSIVE METABOLIC PANEL
ALT: 14 U/L (ref 0–44)
AST: 30 U/L (ref 15–41)
Albumin: 2.4 g/dL — ABNORMAL LOW (ref 3.5–5.0)
Alkaline Phosphatase: 67 U/L (ref 38–126)
Anion gap: 12 (ref 5–15)
BUN: 23 mg/dL (ref 8–23)
CO2: 18 mmol/L — ABNORMAL LOW (ref 22–32)
Calcium: 8.4 mg/dL — ABNORMAL LOW (ref 8.9–10.3)
Chloride: 108 mmol/L (ref 98–111)
Creatinine, Ser: 1.2 mg/dL — ABNORMAL HIGH (ref 0.44–1.00)
GFR, Estimated: 49 mL/min — ABNORMAL LOW (ref >60)
Glucose, Bld: 430 mg/dL — ABNORMAL HIGH (ref 70–99)
Potassium: 4.7 mmol/L (ref 3.5–5.1)
Sodium: 138 mmol/L (ref 135–145)
Total Bilirubin: 0.8 mg/dL (ref 0.3–1.2)
Total Protein: 6.2 g/dL — ABNORMAL LOW (ref 6.5–8.1)

## 2020-02-04 LAB — GLUCOSE, CAPILLARY
Glucose-Capillary: 104 mg/dL — ABNORMAL HIGH (ref 70–99)
Glucose-Capillary: 108 mg/dL — ABNORMAL HIGH (ref 70–99)
Glucose-Capillary: 170 mg/dL — ABNORMAL HIGH (ref 70–99)
Glucose-Capillary: 210 mg/dL — ABNORMAL HIGH (ref 70–99)
Glucose-Capillary: 238 mg/dL — ABNORMAL HIGH (ref 70–99)
Glucose-Capillary: 412 mg/dL — ABNORMAL HIGH (ref 70–99)
Glucose-Capillary: 422 mg/dL — ABNORMAL HIGH (ref 70–99)

## 2020-02-04 LAB — PHOSPHORUS: Phosphorus: 2.6 mg/dL (ref 2.5–4.6)

## 2020-02-04 LAB — C-REACTIVE PROTEIN: CRP: 12.6 mg/dL — ABNORMAL HIGH (ref <1.0)

## 2020-02-04 LAB — D-DIMER, QUANTITATIVE: D-Dimer, Quant: >20 ug/mL-FEU — ABNORMAL HIGH (ref 0.00–0.50)

## 2020-02-04 LAB — MAGNESIUM: Magnesium: 1.9 mg/dL (ref 1.7–2.4)

## 2020-02-04 MED ORDER — INSULIN GLARGINE 100 UNIT/ML ~~LOC~~ SOLN
20.0000 [IU] | Freq: Two times a day (BID) | SUBCUTANEOUS | Status: DC
  Administered 2020-02-04 – 2020-02-06 (×6): 20 [IU] via SUBCUTANEOUS
  Filled 2020-02-04 (×9): qty 0.2

## 2020-02-04 MED ORDER — INSULIN ASPART 100 UNIT/ML ~~LOC~~ SOLN
0.0000 [IU] | Freq: Three times a day (TID) | SUBCUTANEOUS | Status: DC
  Administered 2020-02-04: 7 [IU] via SUBCUTANEOUS
  Administered 2020-02-04: 20 [IU] via SUBCUTANEOUS
  Administered 2020-02-05: 3 [IU] via SUBCUTANEOUS
  Administered 2020-02-05: 11 [IU] via SUBCUTANEOUS
  Administered 2020-02-05 – 2020-02-06 (×2): 3 [IU] via SUBCUTANEOUS
  Administered 2020-02-06: 4 [IU] via SUBCUTANEOUS
  Administered 2020-02-07: 11 [IU] via SUBCUTANEOUS
  Administered 2020-02-07 – 2020-02-08 (×2): 4 [IU] via SUBCUTANEOUS
  Administered 2020-02-08: 11 [IU] via SUBCUTANEOUS

## 2020-02-04 MED ORDER — ADULT MULTIVITAMIN W/MINERALS CH
1.0000 | ORAL_TABLET | Freq: Every day | ORAL | Status: DC
  Administered 2020-02-04 – 2020-02-08 (×5): 1 via ORAL
  Filled 2020-02-04 (×5): qty 1

## 2020-02-04 MED ORDER — SODIUM CHLORIDE (PF) 0.9 % IJ SOLN
INTRAMUSCULAR | Status: AC
  Administered 2020-02-04: 50 mL
  Filled 2020-02-04: qty 50

## 2020-02-04 MED ORDER — PROSOURCE PLUS PO LIQD
30.0000 mL | Freq: Two times a day (BID) | ORAL | Status: DC
  Administered 2020-02-04 – 2020-02-07 (×7): 30 mL via ORAL
  Filled 2020-02-04 (×7): qty 30

## 2020-02-04 MED ORDER — INSULIN ASPART 100 UNIT/ML ~~LOC~~ SOLN
4.0000 [IU] | Freq: Three times a day (TID) | SUBCUTANEOUS | Status: DC
  Administered 2020-02-04 (×3): 4 [IU] via SUBCUTANEOUS

## 2020-02-04 MED ORDER — GLUCERNA SHAKE PO LIQD
237.0000 mL | Freq: Two times a day (BID) | ORAL | Status: DC
  Administered 2020-02-04 – 2020-02-06 (×5): 237 mL via ORAL
  Filled 2020-02-04 (×9): qty 237

## 2020-02-04 MED ORDER — POLYVINYL ALCOHOL 1.4 % OP SOLN
1.0000 [drp] | OPHTHALMIC | Status: DC | PRN
  Administered 2020-02-04 – 2020-02-06 (×2): 1 [drp] via OPHTHALMIC
  Filled 2020-02-04: qty 15

## 2020-02-04 NOTE — Progress Notes (Signed)
PROGRESS NOTE    [name]  DEY:[id number] DOB: 07/21/1950 DOA: 02/02/2020 PCP: [name], DO   Brief Narrative: Taken from H&P.  [name] is a 69 y.o. female with medical history significant of DM2, HTN, hypothyroidism. Patient is unable to give history. History from her son. He reports that about 12 days ago the patient was having body aches. They were concerned that she might have COVID. So, she took a COVID test and was found positive. She self isolated after talking to her doctor. She had no respiratory symptoms or fever. However, she felt more and more weak/achy over the last 4 days. Her PO intake was poor. She wasn't checking her sugars. This morning she was noted to be vomiting. She called her son to let him know she couldn't keep anything down. He called for EMS to bring her to the ED.   She was found to be in DKA and started on Endo tool.  COVID-19 PCR was positive. Procalcitonin was also elevated.  Chest x-ray with bilateral infiltrate consistent with atypical/viral pneumonia.  She was started on antibiotics.  Solu-Medrol was deferred initially as there was no hypoxia, started on Decadron yesterday for months she developed mild hypoxia requiring 2 to 3 L of oxygen. Markedly elevated D-dimer, CTA with no PE, stable bilateral groundglass opacities consistent with COVID  Subjective: Patient continued to have poor appetite, feeling little better, still short of breath.  Assessment & Plan:   Active Problems:   DKA (diabetic ketoacidosis) ([place])   Pneumonia due to COVID-19 virus  Uncontrolled diabetes mellitus with DKA.  She was started on Endo tool, and transition to Lantus and SSI once gap closed x2.  A1c of 11.6.  Not sure about the compliance. CBG elevated this morning, patient was also started on steroids yesterday. -Increase Lantus to 20 units twice daily. -Resistant sliding scale -Add 4 units with meals -Continue to monitor. -Continue with IV fluid as she  still appears dry and bicarbs are still 18.  COVID-19 pneumonia.  Patient with recent COVID-19 infection.  Not much upper respiratory symptoms but she started desaturating while in the hospital. Procalcitonin was elevated at 2.03>>1.92  MRSA swab was negative.  Elevated inflammatory markers with some improvement in CRP today, D-dimer remain above 20. CTA done yesterday with no PE but did show bilateral groundglass opacities. -Continue Decadron 6 mg daily. -Continue with ceftriaxone and Zithromax-to complete a 5-day course. -Continue with the supportive care. -Supplemental oxygen to keep the saturation above 90%-Wean as tolerated  AKI.  Most likely had some CKD at baseline with uncontrolled diabetes.  Creatinine improved to 1.20 today. -Continue to monitor -Avoid nephrotoxins.  Hypophosphatemia.  Resolved with repletion -Continue to monitor  Hyperkalemia.  Resolved  Hypertension.  Pressure within goal. -Continue home meds.  Hyperlipidemia. -Restart home dose of statin.  Hypothyroidism. -Continue home dose of Synthroid.  Objective: Vitals:   02/04/20 0300 02/04/20 0400 02/04/20 0500 02/04/20 0600  BP:  (!) 159/80  (!) 171/78  Pulse: 78 92 87 88  Resp: (!) 25 (!) 28 (!) 23 (!) 21  Temp:  98.1 F (36.7 C)    TempSrc:  Oral    SpO2: 99% 92% 96% 96%  Weight:      Height:        Intake/Output Summary (Last 24 hours) at 02/04/2020 0806 Last data filed at 02/04/2020 0532 Gross per 24 hour  Intake 2671.18 ml  Output --  Net 2671.18 ml   [employer]  02/02/20 1412  Weight: 84 kg    Examination:  General.  Chronically ill-appearing lady, in no acute distress. Pulmonary.  Bilateral scattered rhonchi, normal respiratory effort. CV.  Regular rate and rhythm, no JVD, rub or murmur. Abdomen.  Soft, nontender, nondistended, BS positive. CNS.  Alert and oriented x3.  No focal neurologic deficit. Extremities.  No edema, no cyanosis, pulses intact and  symmetrical. Psychiatry.  Judgment and insight appears normal.  DVT prophylaxis: Lovenox Code Status: Full Family Communication: Son was updated on phone Disposition Plan:  Status is: Inpatient  Remains inpatient appropriate because:Inpatient level of care appropriate due to severity of illness   Dispo: The patient is from: Home              Anticipated d/c is to: Home              Anticipated d/c date is: 1 day              Patient currently is not medically stable to d/c.   Consultants:   None  Procedures:  Antimicrobials:  Ceftriaxone Zithromax  Data Reviewed: I have personally reviewed following labs and imaging studies  CBC: Recent Labs  Lab 02/02/20 1502 02/03/20 0643 02/03/20 2110  WBC 12.0* 10.1 10.1  NEUTROABS 10.0*  --   --   HGB 14.0 14.1 12.7  HCT 43.3 42.1 36.8  MCV 86.8 85.4 80.7  PLT 399 264 283   Basic Metabolic Panel: Recent Labs  Lab 02/02/20 2123 02/03/20 0237 02/03/20 0643 02/03/20 2110 02/04/20 0308  NA 141 143 146* 143 138  K 5.2* 4.2 5.1 4.5 4.7  CL 110 115* 113* 116* 108  CO2 11* 19* 19* 17* 18*  GLUCOSE 327* 81 103* 107* 430*  BUN 35* 28* 22 19 23   CREATININE 1.59* 1.17* 1.24* 1.18* 1.20*  CALCIUM 8.9 8.7* 9.1 9.1 8.4*  MG  --   --  2.1  --  1.9  PHOS  --   --  1.7*  --  2.6   GFR: Estimated Creatinine Clearance: 46.4 mL/min (A) (by C-G formula based on SCr of 1.2 mg/dL (H)). Liver Function Tests: Recent Labs  Lab 02/02/20 1502 02/03/20 0643 02/03/20 2110 02/04/20 0308  AST 32 36 35 30  ALT 18 12 13 14   ALKPHOS 90 71 64 67  BILITOT 1.4* 0.6 0.5 0.8  PROT 8.2* 6.5 6.4* 6.2*  ALBUMIN 3.4* 2.6* 2.4* 2.4*   No results for input(s): LIPASE, AMYLASE in the last 168 hours. No results for input(s): AMMONIA in the last 168 hours. Coagulation Profile: Recent Labs  Lab 02/03/20 0643  INR 1.0   Cardiac Enzymes: No results for input(s): CKTOTAL, CKMB, CKMBINDEX, TROPONINI in the last 168 hours. BNP (last 3 results) No  results for input(s): PROBNP in the last 8760 hours. HbA1C: Recent Labs    02/03/20 1039  HGBA1C 11.6*   CBG: Recent Labs  Lab 02/03/20 1202 02/03/20 1221 02/03/20 1940 02/03/20 2136 02/04/20 0753  GLUCAP 250* 260* 131* 107* 412*   Lipid Profile: Recent Labs    02/02/20 1502  TRIG 306*   Thyroid Function Tests: No results for input(s): TSH, T4TOTAL, FREET4, T3FREE, THYROIDAB in the last 72 hours. Anemia Panel: Recent Labs    02/03/20 0643 02/04/20 0308  FERRITIN 601* 520*   Sepsis Labs: Recent Labs  Lab 02/02/20 1502 02/02/20 1515 02/02/20 2123 02/03/20 0643  PROCALCITON 2.03  --   --  1.92  LATICACIDVEN  --  3.3* 2.0*  --  Recent Results (from the past 240 hour(s))  Resp Panel by RT-PCR (Flu A&B, Covid) Nasopharyngeal Swab     Status: Abnormal   Collection Time: 02/02/20  3:15 PM   Specimen: Nasopharyngeal Swab; Nasopharyngeal(NP) swabs in vial transport medium  Result Value Ref Range Status   SARS Coronavirus 2 by RT PCR POSITIVE (A) NEGATIVE Final    Comment: RESULT CALLED TO, READ BACK BY AND VERIFIED WITH: WEST,S. RN @1735  ON 12.15.2021 BY COHEN,K (NOTE) SARS-CoV-2 target nucleic acids are DETECTED.  The SARS-CoV-2 RNA is generally detectable in upper respiratory specimens during the acute phase of infection. Positive results are indicative of the presence of the identified virus, but do not rule out bacterial infection or co-infection with other pathogens not detected by the test. Clinical correlation with patient history and other diagnostic information is necessary to determine patient infection status. The expected result is Negative.  Fact Sheet for Patients: EntrepreneurPulse.com.au  Fact Sheet for Healthcare Providers: IncredibleEmployment.be  This test is not yet approved or cleared by the [place] FDA and  has been authorized for detection and/or diagnosis of SARS-CoV-2 by FDA under an  Emergency Use Authorization (EUA).  This EUA will remain in effect (meaning this tes t can be used) for the duration of  the COVID-19 declaration under Section 564(b)(1) of the Act, 21 U.S.C. section 360bbb-3(b)(1), unless the authorization is terminated or revoked sooner.     Influenza A by PCR NEGATIVE NEGATIVE Final   Influenza B by PCR NEGATIVE NEGATIVE Final    Comment: (NOTE) The Xpert Xpress SARS-CoV-2/FLU/RSV plus assay is intended as an aid in the diagnosis of influenza from Nasopharyngeal swab specimens and should not be used as a sole basis for treatment. Nasal washings and aspirates are unacceptable for Xpert Xpress SARS-CoV-2/FLU/RSV testing.  Fact Sheet for Patients: EntrepreneurPulse.com.au  Fact Sheet for Healthcare Providers: IncredibleEmployment.be  This test is not yet approved or cleared by the [place] FDA and has been authorized for detection and/or diagnosis of SARS-CoV-2 by FDA under an Emergency Use Authorization (EUA). This EUA will remain in effect (meaning this test can be used) for the duration of the COVID-19 declaration under Section 564(b)(1) of the Act, 21 U.S.C. section 360bbb-3(b)(1), unless the authorization is terminated or revoked.  Performed at [facility], [place] [address], [place], [place] [address]   MRSA PCR Screening     Status: None   Collection Time: 02/03/20 11:24 AM   Specimen: Nasal Mucosa; Nasopharyngeal  Result Value Ref Range Status   MRSA by PCR NEGATIVE NEGATIVE Final    Comment:        The GeneXpert MRSA Assay (FDA approved for NASAL specimens only), is one component of a comprehensive MRSA colonization surveillance program. It is not intended to diagnose MRSA infection nor to guide or monitor treatment for MRSA infections. Performed at [facility], [place] [address], [place], [place] [address]      Radiology Studies: CT ANGIO CHEST PE  W OR WO CONTRAST  Result Date: 02/04/2020 CLINICAL DATA:  Weakness and body aches.  COVID positive. EXAM: CT ANGIOGRAPHY CHEST WITH CONTRAST TECHNIQUE: Multidetector CT imaging of the chest was performed using the standard protocol during bolus administration of intravenous contrast. Multiplanar CT image reconstructions and MIPs were obtained to evaluate the vascular anatomy. CONTRAST:  32mL OMNIPAQUE IOHEXOL 350 MG/ML SOLN COMPARISON:  01/06/2018 FINDINGS: Cardiovascular: Heart size upper normal. No pericardial effusion. No thoracic aortic aneurysm. There is no filling defect within the  opacified pulmonary arteries to suggest the presence of an acute pulmonary embolus. Mediastinum/Nodes: No mediastinal lymphadenopathy. There is no hilar lymphadenopathy. The esophagus has normal imaging features. There is no axillary lymphadenopathy. Lungs/Pleura: Peripheral patchy ground-glass opacities identified in both lungs, left minimally more than right. The ground-glass disease is confluent in some regions and demonstrates slight basilar predominance. 8 mm anterior left lower lobe nodule on 89/6 is stable since 2019 consistent with benign etiology. No pleural effusion. Upper Abdomen: Unremarkable. Musculoskeletal: No worrisome lytic or sclerotic osseous abnormality. Review of the MIP images confirms the above findings. IMPRESSION: 1. No CT evidence for acute pulmonary embolus. 2. Peripheral patchy ground-glass opacities in both lungs, left minimally more than right. Imaging features compatible with multifocal pneumonia and consistent with the reported history of COVID. Electronically Signed   By: Misty Stanley M.D.   On: 02/04/2020 05:49   DG Chest Port 1 View  Result Date: 02/02/2020 CLINICAL DATA:  Cough, weakness, and chills. Recently tested positive for COVID-19. EXAM: PORTABLE CHEST 1 VIEW COMPARISON:  Chest radiographs and CTA 01/06/2018 FINDINGS: The cardiomediastinal silhouette is within normal limits for  portable AP technique. There are interstitial and ill-defined airspace opacities in the mid and lower lungs bilaterally. No sizable pleural effusion or pneumothorax is identified. No acute osseous abnormality is seen. IMPRESSION: Bilateral lung opacities compatible with pneumonia. Electronically Signed   By: Logan Bores M.D.   On: 02/02/2020 14:39    Scheduled Meds: . vitamin C  500 mg Oral Daily  . aspirin EC  81 mg Oral Daily  . azithromycin  500 mg Oral QHS  . Chlorhexidine Gluconate Cloth  6 each Topical Daily  . dexamethasone  6 mg Oral Daily  . enoxaparin (LOVENOX) injection  40 mg Subcutaneous Q24H  . ezetimibe-simvastatin  1 tablet Oral Daily  . insulin aspart  0-20 Units Subcutaneous TID WC  . insulin aspart  0-5 Units Subcutaneous QHS  . insulin aspart  4 Units Subcutaneous TID WC  . insulin glargine  20 Units Subcutaneous BID  . Ipratropium-Albuterol  1 puff Inhalation BID  . levothyroxine  200 mcg Oral Q0600  . losartan  50 mg Oral Daily  . metoprolol succinate  25 mg Oral Daily  . pantoprazole  40 mg Oral Daily  . zinc sulfate  220 mg Oral Daily   Continuous Infusions: . cefTRIAXone (ROCEPHIN)  IV Stopped (02/04/20 0028)  . lactated ringers Stopped (02/02/20 2244)     LOS: 2 days   Time spent: 35 minutes  Lorella Nimrod, MD [facility]  If 7PM-7AM, please contact night-coverage [email or web address]  02/04/2020, 8:06 AM   This record has been created using Systems analyst. Errors have been sought and corrected,but may not always be located. Such creation errors do not reflect on the standard of care.

## 2020-02-04 NOTE — Progress Notes (Signed)
Inpatient Diabetes Program Recommendations  AACE/ADA: New Consensus Statement on Inpatient Glycemic Control (2015)  Target Ranges:  Prepandial:   less than 140 mg/dL      Peak postprandial:   less than 180 mg/dL (1-2 hours)      Critically ill patients:  140 - 180 mg/dL   Lab Results  Component Value Date   GLUCAP 412 (H) 02/04/2020   HGBA1C 11.6 (H) 02/03/2020    Review of Glycemic Control Results for [name], [name] (MRN [id number]) as of 02/04/2020 08:51  Ref. Range 02/03/2020 21:36 02/04/2020 07:53  Glucose-Capillary Latest Ref Range: 70 - 99 mg/dL 107 (H) 412 (H)   Diabetes history: Type 2 DM Outpatient Diabetes medications: Lantus 25 Units QHS, Humalog 2-5 units TID, Metformin 1000 mg BID Current orders for Inpatient glycemic control: Lantus 20 units BID, Novolog 4 units TID, Novolog 0-20 units TID, Novolog 0-5 units QHS Decadron 6 mg QD  Inpatient Diabetes Program Recommendations:    Noted significant elevation to glucose this AM. Reached out to RN and determined patient consumed applesauce around 0200, thus contributing. Of note, patient recevied two doses of Lantus 20 units on 12/16.   If steroids to continue consider: -Adding Tradjenta 5 mg QD - Increasing meal coverage to Novolog 8 units TID (assuming patient is consuming >50% of meal).   Spoke with patient regarding outpatient diabetes management. Patient admits to missing doses, especially when she started feeling bad. Is followed by Dr Garnet Koyanagi, outpatient endocrinology with last appointment 08/05/19 and at that time A1C was at goal. Reviewed patient's current A1c of 11.6%. Explained what a A1c is and what it measures. Also reviewed goal A1c with patient, importance of good glucose control @ home, and blood sugar goals. Reviewed patho of DM, need for insulin, role of pancreas, DKA, impact of infection and steroids on glucose trends, vascular changes and comorbidities.  Patient has a meter a testing supplies. Reports  that glucose trends have been more increased in the last few weeks.  Patient denies drinking sugary beverages at home and works to be mindful of CHO intake.  Stressed the importance of taking medications as prescribed, reviewed when to reach out to MD and encouraged to make an appointment to follow up with endo when appropriate. Patient has no questions at this time.    Thanks, Bronson Curb, MSN, RNC-OB Diabetes Coordinator [phone] (8a-5p)

## 2020-02-04 NOTE — Progress Notes (Signed)
Initial Nutrition Assessment  DOCUMENTATION CODES:   Obesity unspecified  INTERVENTION:  - will order Glucerna Shake BID, each supplement provides 220 kcal and 10 grams of protein. - will order 30 ml Prosource Plus BID, each supplement provides 100 kcal and 15 grams protein.  - will order 1 tablet multivitamin with minerals/day.   NUTRITION DIAGNOSIS:   Increased nutrient needs related to acute illness,catabolic illness (YWVPX-10 infection) as evidenced by estimated needs  GOAL:   Patient will meet greater than or equal to 90% of their needs  MONITOR:   PO intake,Supplement acceptance,Labs,Weight trends  REASON FOR ASSESSMENT:   Malnutrition Screening Tool  ASSESSMENT:   69 y.o. female with medical history of type 2 DM, HTN, and hypothyroidism. She began having body aches 12 days PTA. She took a home COVID test which was positive; self-isolated after talking to her doctor. No respiratory symptoms or fever. She was not checking CBGs. She began vomiting the day of admission so she called her son who called EMS for transport to the ED. In the ED, she was found to be in DKA and COVID-19 PCR was positive. CXR showed bilateral infiltrates.  The only documented PO intake since admission is 25% of lunch yesterday. Able to talk with RN who reports that patient did not eat any dinner yesterday. Breakfast tray visualized at the time of visit with ~50% completion of sausage and scrambled eggs. She did not have anything else on the tray and did not feel she could eat more d/t the items being cold.   Patient denies any taste changes/loss, any shortness of breath with eating, and although she has partials that are not currently with her, she has no issues chewing. Partials fit well and do not move when worn.  Patient was previously eating 3 meals/day and checking CBG before each meal. She has had a poor appetite since 12/9 and has not been checking her CBGs since starting to feel sick ~2 weeks  ago. When asked why she was not checking CBGs during that time, patient reported that she "had no desire."   Patient reports that CBGs have been rising PTA but was unable to give time frame and unable to provide what a recent CBG reading or range was for her, unable to define what a high CBG for her is.   She denies any BLE weakness and is able to ambulate unassisted at home. She cares for her 53 year-old mother, who is also currently hospitalized, per patient report. Patient reports plans for her mom to d/c to [facility].   She states UBW is 185-190 lb. Weight on admission (12/15) was 185 lb and PTA the most recently documented weight was on 01/06/18 when she weighed 186 lb.     Labs reviewed; HgbA1c: 11.6%, CBG: 412 mg/dl, creatinine: 1.2 mg/dl, Ca: 8.4 mg/dl, GFR: 49 ml/min.  Medications reviewed; 500 mg ascorbic acid/day, sliding scale novolog, 4 units novolog TID, 20 units lantus BID, 200 mcg oral synthroid/day, 40 mg oral protonix/day, 250 mg KPhos neutral x2 doses 12/16, 220 mg zinc sulfate/day.  IVF; LR @ 125 ml/hr.     NUTRITION - FOCUSED PHYSICAL EXAM:  completed; no muscle or fat depletion noted; moderate pitting edema to BLE.   Diet Order:   Diet Order            Diet Heart Room service appropriate? Yes; Fluid consistency: Thin  Diet effective now  EDUCATION NEEDS:   Not appropriate for education at this time  Skin:  Skin Assessment: Reviewed RN Assessment  Last BM:  PAT/unknown  Height:   Ht Readings from Last 1 Encounters:  02/02/20 5\' 4"  (1.626 m)    Weight:   Wt Readings from Last 1 Encounters:  02/02/20 84 kg   Estimated Nutritional Needs:  Kcal:  2100-2350 kcal Protein:  115-125 grams Fluid:  >/= 2.5 L/day     Jarome Matin, MS, RD, LDN, CNSC Inpatient Clinical Dietitian RD pager # available in [facility]  After hours/weekend pager # available in [facility]

## 2020-02-04 NOTE — Progress Notes (Signed)
Provider was notified of patients CBG that was 412 and new orders were given.

## 2020-02-05 LAB — COMPREHENSIVE METABOLIC PANEL
ALT: 14 U/L (ref 0–44)
AST: 33 U/L (ref 15–41)
Albumin: 2.4 g/dL — ABNORMAL LOW (ref 3.5–5.0)
Alkaline Phosphatase: 68 U/L (ref 38–126)
Anion gap: 13 (ref 5–15)
BUN: 23 mg/dL (ref 8–23)
CO2: 19 mmol/L — ABNORMAL LOW (ref 22–32)
Calcium: 8.7 mg/dL — ABNORMAL LOW (ref 8.9–10.3)
Chloride: 104 mmol/L (ref 98–111)
Creatinine, Ser: 1.22 mg/dL — ABNORMAL HIGH (ref 0.44–1.00)
GFR, Estimated: 48 mL/min — ABNORMAL LOW (ref >60)
Glucose, Bld: 285 mg/dL — ABNORMAL HIGH (ref 70–99)
Potassium: 5.1 mmol/L (ref 3.5–5.1)
Sodium: 136 mmol/L (ref 135–145)
Total Bilirubin: 0.9 mg/dL (ref 0.3–1.2)
Total Protein: 6.4 g/dL — ABNORMAL LOW (ref 6.5–8.1)

## 2020-02-05 LAB — MAGNESIUM: Magnesium: 1.9 mg/dL (ref 1.7–2.4)

## 2020-02-05 LAB — GLUCOSE, CAPILLARY
Glucose-Capillary: 258 mg/dL — ABNORMAL HIGH (ref 70–99)
Glucose-Capillary: 142 mg/dL — ABNORMAL HIGH (ref 70–99)
Glucose-Capillary: 131 mg/dL — ABNORMAL HIGH (ref 70–99)
Glucose-Capillary: 184 mg/dL — ABNORMAL HIGH (ref 70–99)

## 2020-02-05 LAB — C-REACTIVE PROTEIN: CRP: 6 mg/dL — ABNORMAL HIGH (ref <1.0)

## 2020-02-05 LAB — D-DIMER, QUANTITATIVE: D-Dimer, Quant: >20 ug/mL-FEU — ABNORMAL HIGH (ref 0.00–0.50)

## 2020-02-05 LAB — FERRITIN: Ferritin: 554 ng/mL — ABNORMAL HIGH (ref 11–307)

## 2020-02-05 LAB — PHOSPHORUS: Phosphorus: 2.9 mg/dL (ref 2.5–4.6)

## 2020-02-05 MED ORDER — SODIUM BICARBONATE 650 MG PO TABS
650.0000 mg | ORAL_TABLET | Freq: Two times a day (BID) | ORAL | Status: DC
  Administered 2020-02-05 – 2020-02-08 (×7): 650 mg via ORAL
  Filled 2020-02-05 (×7): qty 1

## 2020-02-05 MED ORDER — LINAGLIPTIN 5 MG PO TABS
5.0000 mg | ORAL_TABLET | Freq: Every day | ORAL | Status: DC
  Administered 2020-02-05 – 2020-02-08 (×4): 5 mg via ORAL
  Filled 2020-02-05 (×4): qty 1

## 2020-02-05 MED ORDER — INSULIN ASPART 100 UNIT/ML ~~LOC~~ SOLN
6.0000 [IU] | Freq: Three times a day (TID) | SUBCUTANEOUS | Status: DC
  Administered 2020-02-05 – 2020-02-06 (×5): 6 [IU] via SUBCUTANEOUS

## 2020-02-05 MED ORDER — LOSARTAN POTASSIUM 50 MG PO TABS
100.0000 mg | ORAL_TABLET | Freq: Every day | ORAL | Status: DC
  Administered 2020-02-06 – 2020-02-08 (×3): 100 mg via ORAL
  Filled 2020-02-05 (×3): qty 2

## 2020-02-05 NOTE — Evaluation (Signed)
Physical Therapy Evaluation Patient Details Name: [name] MRN: [id number] DOB: 08-23-1950 Today's Date: 02/05/2020   History of Present Illness  69 y.o. female with medical history significant of DM2, HTN, hypothyroidism, DVT 2007, NSTEMI admitted to ED on 12/15 with 10-day history of covid +, DKA with CBG 787 upon arrival. CXR demonstrates bilateral covid PNA, d-dimer elevated but no evidence of PE.  Clinical Impression   Pt presents with generalized weakness, impaired respiratory status with poor breathing technique, impaired standing balance, and decreased activity tolerance vs baseline. Pt to benefit from acute PT to address deficits. Pt ambulated to and from bathroom in room with min assist to steady. Pt with DOE 2/4 with SPO2 minimum 73% on 2LO2 during ambulation, PT had to increase O2 to 6LO2 during session due to pt difficulty recovering sats >85% with 4LO2 and breathing technique. RN notified. PT administered incentive spirometer to improve pulmonary function and breathing technique, pt with difficulty incorporating pursed lip breathing. PT anticipates pt will be able to d/c home with [facility] services, pt's great aunt is also in the hospital with covid per pt report. PT to progress mobility as tolerated, and will continue to follow acutely.      Follow Up Recommendations Home health PT;Supervision for mobility/OOB    Equipment Recommendations  Rolling walker with 5" wheels    Recommendations for Other Services       Precautions / Restrictions Precautions Precautions: Fall Precaution Comments: sats Restrictions Weight Bearing Restrictions: No      Mobility  Bed Mobility Overal bed mobility: Needs Assistance Bed Mobility: Supine to Sit     Supine to sit: HOB elevated;Min guard     General bed mobility comments: min guard for safety, increased time and effort to scoot to EOB. Bout of coughing with initial mobility    Transfers Overall transfer level: Needs  assistance Equipment used: 1 person hand held assist Transfers: Sit to/from Stand Sit to Stand: Min assist         General transfer comment: Min assist for initial power up and rise, STS x2 from EOB and toilet.  Ambulation/Gait Ambulation/Gait assistance: Min assist Gait Distance (Feet): 10 Feet (x2 - to and from bathroom) Assistive device: IV Pole;1 person hand held assist Gait Pattern/deviations: Step-through pattern;Decreased stride length;Trunk flexed Gait velocity: decr   General Gait Details: Min assist to steady, manage lines/leads. Pt with DOE 2/4 with SPO2 minimum 73% on 2LO2, eventually increased O2 to 6LO2 due to difficulty recovering sats >85% with 4LO2 and breathing technique.  Stairs            Wheelchair Mobility    Modified Rankin (Stroke Patients Only)       Balance Overall balance assessment: Needs assistance Sitting-balance support: No upper extremity supported;Feet supported Sitting balance-Leahy Scale: Good     Standing balance support: Bilateral upper extremity supported;During functional activity Standing balance-Leahy Scale: Poor Standing balance comment: reliant on external assist                             Pertinent Vitals/Pain Pain Assessment: Faces Faces Pain Scale: No hurt Pain Intervention(s): Monitored during session    Home Living Family/patient expects to be discharged to:: Private residence Living Arrangements: Other (Comment) (55 year old great aunt, 52 year old grandson whom pt is raising) Available Help at Discharge: Family;Available PRN/intermittently (son lives closeby) Type of Home: House Home Access: Stairs to enter   [employer] of Steps:  4 Home Layout: One level Home Equipment: Cane - single point      Prior Function Level of Independence: Independent         Comments: at baseline, pt is completely independent, cares for her great aunt (medication management, meals, all ADLs)      Hand Dominance   Dominant Hand: Right    Extremity/Trunk Assessment   Upper Extremity Assessment Upper Extremity Assessment: Defer to OT evaluation    Lower Extremity Assessment Lower Extremity Assessment: Generalized weakness    Cervical / Trunk Assessment Cervical / Trunk Assessment: Normal  Communication   Communication: No difficulties  Cognition Arousal/Alertness: Awake/alert Behavior During Therapy: WFL for tasks assessed/performed Overall Cognitive Status: Within Functional Limits for tasks assessed                                        General Comments General comments (skin integrity, edema, etc.): 2LO2 pre-session with Spo2 93-96%. Pt required 6LO2 to recover Spo2 to 90%, placed on 4LO2 post-session with SpO2 ranging from 88-90%. PT administered incentive spirometer    Exercises     Assessment/Plan    PT Assessment Patient needs continued PT services  PT Problem List Decreased strength;Decreased mobility;Decreased safety awareness;Decreased knowledge of precautions;Cardiopulmonary status limiting activity;Decreased activity tolerance;Decreased balance;Decreased knowledge of use of DME       PT Treatment Interventions DME instruction;Therapeutic activities;Gait training;Therapeutic exercise;Patient/family education;Balance training;Stair training;Functional mobility training;Neuromuscular re-education    PT Goals (Current goals can be found in the Care Plan section)  Acute Rehab PT Goals Patient Stated Goal: go home PT Goal Formulation: With patient Time For Goal Achievement: 02/19/20 Potential to Achieve Goals: Good    Frequency Min 3X/week   Barriers to discharge        Co-evaluation               AM-PAC PT "6 Clicks" Mobility  Outcome Measure Help needed turning from your back to your side while in a flat bed without using bedrails?: A Little Help needed moving from lying on your back to sitting on the side of a flat  bed without using bedrails?: A Little Help needed moving to and from a bed to a chair (including a wheelchair)?: A Little Help needed standing up from a chair using your arms (e.g., wheelchair or bedside chair)?: A Little Help needed to walk in hospital room?: A Little Help needed climbing 3-5 steps with a railing? : A Lot 6 Click Score: 17    End of Session Equipment Utilized During Treatment: Oxygen Activity Tolerance: Patient tolerated treatment well;Patient limited by fatigue;Other (comment) (coughing bouts, O2 desaturation) Patient left: in chair;with chair alarm set;with call bell/phone within reach;with nursing/sitter in room Nurse Communication: Mobility status;Other (comment) (needs stool softener for no BM since admission, lozenges) PT Visit Diagnosis: Other abnormalities of gait and mobility (R26.89);Difficulty in walking, not elsewhere classified (R26.2)    Time: 1550-1630 PT Time Calculation (min) (ACUTE ONLY): 40 min   Charges:   PT Evaluation $PT Eval Low Complexity: 1 Low PT Treatments $Gait Training: 8-22 mins       Stacie Glaze, PT Acute Rehabilitation Services Pager [phone]  Office [phone]  Louis Matte 02/05/2020, 5:23 PM

## 2020-02-05 NOTE — Progress Notes (Signed)
PROGRESS NOTE    [name]  FIE:[id number] DOB: 04/10/1950 DOA: 02/02/2020 PCP: [name], DO   Brief Narrative: Taken from H&P.  [name] is a 69 y.o. female with medical history significant of DM2, HTN, hypothyroidism. Patient is unable to give history. History from her son. He reports that about 12 days ago the patient was having body aches. They were concerned that she might have COVID. So, she took a COVID test and was found positive. She self isolated after talking to her doctor. She had no respiratory symptoms or fever. However, she felt more and more weak/achy over the last 4 days. Her PO intake was poor. She wasn't checking her sugars. This morning she was noted to be vomiting. She called her son to let him know she couldn't keep anything down. He called for EMS to bring her to the ED.   She was found to be in DKA and started on Endo tool.  COVID-19 PCR was positive. Procalcitonin was also elevated.  Chest x-ray with bilateral infiltrate consistent with atypical/viral pneumonia.  She was started on antibiotics.  Solu-Medrol was deferred initially as there was no hypoxia, started on Decadron yesterday for months she developed mild hypoxia requiring 2 to 3 L of oxygen. Markedly elevated D-dimer, CTA with no PE, stable bilateral groundglass opacities consistent with COVID.  Subjective: Patient continued to have significant cough and desaturating on room air.  Assessment & Plan:   Active Problems:   DKA (diabetic ketoacidosis) ([place])   Pneumonia due to COVID-19 virus  Uncontrolled diabetes mellitus with DKA.  She was started on Endo tool, and transition to Lantus and SSI once gap closed x2.  A1c of 11.6.  Not sure about the compliance. CBG elevated this morning, patient was also started on steroids. -Add Tradjenta 5 mg daily -Continue Lantus to 20 units twice daily. -Continue resistant sliding scale -Increase mealtime coverage to 6 units. -Continue to  monitor. -Continue with IV fluid as she still appears dry and bicarbs are still 18.  COVID-19 pneumonia.  Patient with recent COVID-19 infection.  Not much upper respiratory symptoms but she started desaturating while in the hospital. Procalcitonin was elevated at 2.03>>1.92  MRSA swab was negative.  Elevated inflammatory markers with some improvement in CRP , D-dimer remain above 20. CTA  with no PE but did show bilateral groundglass opacities. -Continue Decadron 6 mg daily. -Continue with ceftriaxone and Zithromax-to complete a 5-day course. -Continue with the supportive care. -Supplemental oxygen to keep the saturation above 90%-Wean as tolerated  AKI.  Most likely had some CKD at baseline with uncontrolled diabetes.  Creatinine at 1.22 today, with bicarb of 19.  Might be her baseline. -P.o. bicarb supplement -Continue to monitor -Avoid nephrotoxins.  Hypophosphatemia.  Resolved with repletion -Continue to monitor  Hyperkalemia.  Resolved  Hypertension.  Pressure elevated today. -Discontinue IV fluid. -Increased dose of losartan to 100 mg daily. -Continue home metoprolol.  Hyperlipidemia. -Continue home dose of statin.  Hypothyroidism. -Continue home dose of Synthroid.  Objective: Vitals:   02/05/20 1000 02/05/20 1050 02/05/20 1100 02/05/20 1200  BP: (!) 158/79 (!) 162/74  (!) 169/74  Pulse: 93 88 (!) 101 84  Resp: (!) 22  (!) 21 14  Temp:    98.1 F (36.7 C)  TempSrc:    Oral  SpO2: 94%  91% 91%  Weight:      Height:        Intake/Output Summary (Last 24 hours) at 02/05/2020 1310 Last data  filed at 02/05/2020 0600 Gross per 24 hour  Intake 790.08 ml  Output 1000 ml  Net -209.92 ml   Filed Weights   02/02/20 1412  Weight: 84 kg   Examination:  General exam: Appears calm and comfortable  Respiratory system: Clear to auscultation. Respiratory effort normal. Cardiovascular system: S1 & S2 heard, RRR.  Gastrointestinal system: Soft, nontender, nondistended,  bowel sounds positive. Central nervous system: Alert and oriented. No focal neurological deficits. Extremities: No edema, no cyanosis, pulses intact and symmetrical. Psychiatry: Judgement and insight appear normal.     DVT prophylaxis: Lovenox Code Status: Full Family Communication: Son was updated on phone Disposition Plan:  Status is: Inpatient  Remains inpatient appropriate because:Inpatient level of care appropriate due to severity of illness   Dispo: The patient is from: Home              Anticipated d/c is to: Home              Anticipated d/c date is: 1 day              Patient currently is not medically stable to d/c.   Consultants:   None  Procedures:  Antimicrobials:  Ceftriaxone Zithromax  Data Reviewed: I have personally reviewed following labs and imaging studies  CBC: Recent Labs  Lab 02/02/20 1502 02/03/20 0643 02/03/20 2110  WBC 12.0* 10.1 10.1  NEUTROABS 10.0*  --   --   HGB 14.0 14.1 12.7  HCT 43.3 42.1 36.8  MCV 86.8 85.4 80.7  PLT 399 264 979   Basic Metabolic Panel: Recent Labs  Lab 02/03/20 0237 02/03/20 0643 02/03/20 2110 02/04/20 0308 02/05/20 0240  NA 143 146* 143 138 136  K 4.2 5.1 4.5 4.7 5.1  CL 115* 113* 116* 108 104  CO2 19* 19* 17* 18* 19*  GLUCOSE 81 103* 107* 430* 285*  BUN 28* 22 19 23 23   CREATININE 1.17* 1.24* 1.18* 1.20* 1.22*  CALCIUM 8.7* 9.1 9.1 8.4* 8.7*  MG  --  2.1  --  1.9 1.9  PHOS  --  1.7*  --  2.6 2.9   GFR: Estimated Creatinine Clearance: 45.6 mL/min (A) (by C-G formula based on SCr of 1.22 mg/dL (H)). Liver Function Tests: Recent Labs  Lab 02/02/20 1502 02/03/20 0643 02/03/20 2110 02/04/20 0308 02/05/20 0240  AST 32 36 35 30 33  ALT 18 12 13 14 14   ALKPHOS 90 71 64 67 68  BILITOT 1.4* 0.6 0.5 0.8 0.9  PROT 8.2* 6.5 6.4* 6.2* 6.4*  ALBUMIN 3.4* 2.6* 2.4* 2.4* 2.4*   No results for input(s): LIPASE, AMYLASE in the last 168 hours. No results for input(s): AMMONIA in the last 168  hours. Coagulation Profile: Recent Labs  Lab 02/03/20 0643  INR 1.0   Cardiac Enzymes: No results for input(s): CKTOTAL, CKMB, CKMBINDEX, TROPONINI in the last 168 hours. BNP (last 3 results) No results for input(s): PROBNP in the last 8760 hours. HbA1C: Recent Labs    02/03/20 1039  HGBA1C 11.6*   CBG: Recent Labs  Lab 02/04/20 1730 02/04/20 2134 02/04/20 2341 02/05/20 0753 02/05/20 1118  GLUCAP 104* 108* 170* 258* 142*   Lipid Profile: Recent Labs    02/02/20 1502  TRIG 306*   Thyroid Function Tests: No results for input(s): TSH, T4TOTAL, FREET4, T3FREE, THYROIDAB in the last 72 hours. Anemia Panel: Recent Labs    02/04/20 0308 02/05/20 0240  FERRITIN 520* 554*   Sepsis Labs: Recent Labs  Lab 02/02/20  1502 02/02/20 1515 02/02/20 2123 02/03/20 0643  PROCALCITON 2.03  --   --  1.92  LATICACIDVEN  --  3.3* 2.0*  --     Recent Results (from the past 240 hour(s))  Blood Culture (routine x 2)     Status: None (Preliminary result)   Collection Time: 02/02/20  2:50 PM   Specimen: BLOOD LEFT FOREARM  Result Value Ref Range Status   Specimen Description   Final    BLOOD LEFT FOREARM Performed at [facility] [address] [place], [place] [address]    Special Requests   Final    BOTTLES DRAWN AEROBIC AND ANAEROBIC Blood Culture results may not be optimal due to an inadequate volume of blood received in culture bottles Performed at [facility] [address], [place], [place] [address]    Culture   Final    NO GROWTH 2 DAYS Performed at [facility] [address], [place], [place] [address]    Report Status PENDING  Incomplete  Blood Culture (routine x 2)     Status: None (Preliminary result)   Collection Time: 02/02/20  2:55 PM   Specimen: BLOOD RIGHT FOREARM  Result Value Ref Range Status   Specimen Description   Final    BLOOD RIGHT FOREARM Performed at [facility]  [address] [place] , [place] [address]    Special Requests   Final    BOTTLES DRAWN AEROBIC AND ANAEROBIC Blood Culture results may not be optimal due to an inadequate volume of blood received in culture bottles Performed at [facility] [address] [place], [place] [address]    Culture   Final    NO GROWTH 2 DAYS Performed at [facility] [address], [place], [place] [address]    Report Status PENDING  Incomplete  Resp Panel by RT-PCR (Flu A&B, Covid) Nasopharyngeal Swab     Status: Abnormal   Collection Time: 02/02/20  3:15 PM   Specimen: Nasopharyngeal Swab; Nasopharyngeal(NP) swabs in vial transport medium  Result Value Ref Range Status   SARS Coronavirus 2 by RT PCR POSITIVE (A) NEGATIVE Final    Comment: RESULT CALLED TO, READ BACK BY AND VERIFIED WITH: WEST,S. RN @1735  ON 12.15.2021 BY COHEN,K (NOTE) SARS-CoV-2 target nucleic acids are DETECTED.  The SARS-CoV-2 RNA is generally detectable in upper respiratory specimens during the acute phase of infection. Positive results are indicative of the presence of the identified virus, but do not rule out bacterial infection or co-infection with other pathogens not detected by the test. Clinical correlation with patient history and other diagnostic information is necessary to determine patient infection status. The expected result is Negative.  Fact Sheet for Patients: EntrepreneurPulse.com.au  Fact Sheet for Healthcare Providers: IncredibleEmployment.be  This test is not yet approved or cleared by the [place] FDA and  has been authorized for detection and/or diagnosis of SARS-CoV-2 by FDA under an Emergency Use Authorization (EUA).  This EUA will remain in effect (meaning this tes t can be used) for the duration of  the COVID-19 declaration under Section 564(b)(1) of the Act, 21 U.S.C. section 360bbb-3(b)(1), unless the authorization is terminated or  revoked sooner.     Influenza A by PCR NEGATIVE NEGATIVE Final   Influenza B by PCR NEGATIVE NEGATIVE Final    Comment: (NOTE) The Xpert Xpress SARS-CoV-2/FLU/RSV plus assay is intended as an aid in the diagnosis of influenza from Nasopharyngeal swab specimens  and should not be used as a sole basis for treatment. Nasal washings and aspirates are unacceptable for Xpert Xpress SARS-CoV-2/FLU/RSV testing.  Fact Sheet for Patients: EntrepreneurPulse.com.au  Fact Sheet for Healthcare Providers: IncredibleEmployment.be  This test is not yet approved or cleared by the [place] FDA and has been authorized for detection and/or diagnosis of SARS-CoV-2 by FDA under an Emergency Use Authorization (EUA). This EUA will remain in effect (meaning this test can be used) for the duration of the COVID-19 declaration under Section 564(b)(1) of the Act, 21 U.S.C. section 360bbb-3(b)(1), unless the authorization is terminated or revoked.  Performed at [facility], [place] [address] [place], [place] [address]   MRSA PCR Screening     Status: None   Collection Time: 02/03/20 11:24 AM   Specimen: Nasal Mucosa; Nasopharyngeal  Result Value Ref Range Status   MRSA by PCR NEGATIVE NEGATIVE Final    Comment:        The GeneXpert MRSA Assay (FDA approved for NASAL specimens only), is one component of a comprehensive MRSA colonization surveillance program. It is not intended to diagnose MRSA infection nor to guide or monitor treatment for MRSA infections. Performed at [facility], [place] [address], [place], [place] [address]      Radiology Studies: CT ANGIO CHEST PE W OR WO CONTRAST  Result Date: 02/04/2020 CLINICAL DATA:  Weakness and body aches.  COVID positive. EXAM: CT ANGIOGRAPHY CHEST WITH CONTRAST TECHNIQUE: Multidetector CT imaging of the chest was performed using the standard protocol during bolus administration  of intravenous contrast. Multiplanar CT image reconstructions and MIPs were obtained to evaluate the vascular anatomy. CONTRAST:  55mL OMNIPAQUE IOHEXOL 350 MG/ML SOLN COMPARISON:  01/06/2018 FINDINGS: Cardiovascular: Heart size upper normal. No pericardial effusion. No thoracic aortic aneurysm. There is no filling defect within the opacified pulmonary arteries to suggest the presence of an acute pulmonary embolus. Mediastinum/Nodes: No mediastinal lymphadenopathy. There is no hilar lymphadenopathy. The esophagus has normal imaging features. There is no axillary lymphadenopathy. Lungs/Pleura: Peripheral patchy ground-glass opacities identified in both lungs, left minimally more than right. The ground-glass disease is confluent in some regions and demonstrates slight basilar predominance. 8 mm anterior left lower lobe nodule on 89/6 is stable since 2019 consistent with benign etiology. No pleural effusion. Upper Abdomen: Unremarkable. Musculoskeletal: No worrisome lytic or sclerotic osseous abnormality. Review of the MIP images confirms the above findings. IMPRESSION: 1. No CT evidence for acute pulmonary embolus. 2. Peripheral patchy ground-glass opacities in both lungs, left minimally more than right. Imaging features compatible with multifocal pneumonia and consistent with the reported history of COVID. Electronically Signed   By: Misty Stanley M.D.   On: 02/04/2020 05:49    Scheduled Meds: . (feeding supplement) PROSource Plus  30 mL Oral BID BM  . vitamin C  500 mg Oral Daily  . aspirin EC  81 mg Oral Daily  . azithromycin  500 mg Oral QHS  . Chlorhexidine Gluconate Cloth  6 each Topical Daily  . dexamethasone  6 mg Oral Daily  . enoxaparin (LOVENOX) injection  40 mg Subcutaneous Q24H  . ezetimibe-simvastatin  1 tablet Oral Daily  . feeding supplement (GLUCERNA SHAKE)  237 mL Oral BID BM  . insulin aspart  0-20 Units Subcutaneous TID WC  . insulin aspart  0-5 Units Subcutaneous QHS  . insulin  aspart  6 Units Subcutaneous TID WC  . insulin glargine  20 Units Subcutaneous BID  . Ipratropium-Albuterol  1 puff Inhalation  BID  . levothyroxine  200 mcg Oral Q0600  . linagliptin  5 mg Oral Daily  . losartan  50 mg Oral Daily  . metoprolol succinate  25 mg Oral Daily  . multivitamin with minerals  1 tablet Oral Daily  . pantoprazole  40 mg Oral Daily  . zinc sulfate  220 mg Oral Daily   Continuous Infusions: . cefTRIAXone (ROCEPHIN)  IV Stopped (02/04/20 2156)  . lactated ringers 100 mL/hr at 02/05/20 1055     LOS: 3 days   Time spent: 30 minutes  Lorella Nimrod, MD [facility]  If 7PM-7AM, please contact night-coverage [email or web address]  02/05/2020, 1:10 PM   This record has been created using Systems analyst. Errors have been sought and corrected,but may not always be located. Such creation errors do not reflect on the standard of care.

## 2020-02-06 ENCOUNTER — Inpatient Hospital Stay (HOSPITAL_COMMUNITY): Payer: Medicare Other

## 2020-02-06 DIAGNOSIS — E1110 Type 2 diabetes mellitus with ketoacidosis without coma: Secondary | ICD-10-CM

## 2020-02-06 LAB — COMPREHENSIVE METABOLIC PANEL
ALT: 12 U/L (ref 0–44)
AST: 26 U/L (ref 15–41)
Albumin: 2.2 g/dL — ABNORMAL LOW (ref 3.5–5.0)
Alkaline Phosphatase: 61 U/L (ref 38–126)
Anion gap: 11 (ref 5–15)
BUN: 25 mg/dL — ABNORMAL HIGH (ref 8–23)
CO2: 21 mmol/L — ABNORMAL LOW (ref 22–32)
Calcium: 8.5 mg/dL — ABNORMAL LOW (ref 8.9–10.3)
Chloride: 105 mmol/L (ref 98–111)
Creatinine, Ser: 1.13 mg/dL — ABNORMAL HIGH (ref 0.44–1.00)
GFR, Estimated: 53 mL/min — ABNORMAL LOW (ref >60)
Glucose, Bld: 183 mg/dL — ABNORMAL HIGH (ref 70–99)
Potassium: 4 mmol/L (ref 3.5–5.1)
Sodium: 137 mmol/L (ref 135–145)
Total Bilirubin: 0.3 mg/dL (ref 0.3–1.2)
Total Protein: 5.6 g/dL — ABNORMAL LOW (ref 6.5–8.1)

## 2020-02-06 LAB — GLUCOSE, CAPILLARY
Glucose-Capillary: 50 mg/dL — ABNORMAL LOW (ref 70–99)
Glucose-Capillary: 138 mg/dL — ABNORMAL HIGH (ref 70–99)
Glucose-Capillary: 171 mg/dL — ABNORMAL HIGH (ref 70–99)
Glucose-Capillary: 112 mg/dL — ABNORMAL HIGH (ref 70–99)

## 2020-02-06 LAB — PROCALCITONIN: Procalcitonin: 0.18 ng/mL

## 2020-02-06 LAB — MAGNESIUM: Magnesium: 1.8 mg/dL (ref 1.7–2.4)

## 2020-02-06 LAB — FERRITIN: Ferritin: 476 ng/mL — ABNORMAL HIGH (ref 11–307)

## 2020-02-06 LAB — PHOSPHORUS: Phosphorus: 3.5 mg/dL (ref 2.5–4.6)

## 2020-02-06 LAB — C-REACTIVE PROTEIN: CRP: 2.4 mg/dL — ABNORMAL HIGH (ref <1.0)

## 2020-02-06 LAB — D-DIMER, QUANTITATIVE: D-Dimer, Quant: >20 ug/mL-FEU — ABNORMAL HIGH (ref 0.00–0.50)

## 2020-02-06 MED ORDER — VITAMIN D 25 MCG (1000 UNIT) PO TABS
1000.0000 [IU] | ORAL_TABLET | Freq: Every day | ORAL | Status: DC
  Administered 2020-02-06 – 2020-02-08 (×3): 1000 [IU] via ORAL
  Filled 2020-02-06 (×3): qty 1

## 2020-02-06 NOTE — Progress Notes (Signed)
Patient has not given consent to use either Remdesivir or Baricitinib.  She stated that she will talk to her son first and then let us know if she consents or declines the use of the above drugs.

## 2020-02-06 NOTE — Progress Notes (Signed)
PROGRESS NOTE  [name]:[id number] DOB: [birth year] DOA: 02/02/2020 PCP: [name], DO  HPI/Recap of past 24 hours: Holly Vanderhoff Harrisis a 69 y.o.femalewith medical history significant ofDM2, HTN, hypothyroidism. Patient is unable to give history. History from her son. He reports that about 12 days ago the patient was having body aches. They were concerned that she might have COVID. So, she took a COVID test and was found positive. She self isolated after talking to her doctor. She had no respiratory symptoms or fever. However, she felt more and more weak/achy over the last 4 days. Her PO intake was poor. She wasn't checking her sugars. This morning she was noted to be vomiting. She called her son to let him know she couldn't keep anything down. He called for EMS to bring her to the ED. She was found to be in DKA and started on Endo tool.  COVID-19 PCR was positive. Procalcitonin was also elevated.  Chest x-ray with bilateral infiltrate consistent with atypical/viral pneumonia.  She was started on antibiotics.  Solu-Medrol was deferred initially as there was no hypoxia, started on Decadron yesterday for months she developed mild hypoxia requiring 2 to 3 L of oxygen. Markedly elevated D-dimer, CTA with no PE, stable bilateral groundglass opacities consistent with COVID.  02/06/20: Seen and examined.  Reports dyspnea with minimal exertion.  Infiltrates consistent with covid 19 viral pneumonia on cxr done on 12/19. Currently on room air. Will obtain home oxygen evaluation.  Assessment/Plan: Active Problems:   DKA (diabetic ketoacidosis) ([place])   Pneumonia due to COVID-19 virus  COVID-19 viral pneumonia, superimposed by bacterial pneumonia POA Presented with procalcitonin of 2.03 on 02/02/2020 and positive COVID-19 screening test on 02/02/2020. Evidence of COVID-19 viral pneumonia and bacterial pneumonia on chest x-ray done on 02/02/2020. Currently on Rocephin and azithromycin  which she has tolerated well No prior remdesivir or baricitinib received Will obtain home oxygen evaluation and will start on COVID-19 directed therapies if she is hypoxic and agreeable. Independently reviewed chest x-ray done on admission and on 02/06/2020 showing bilateral pulmonary infiltrates consistent with COVID-19 pneumonia. Elevated inflammatory markers, trend Continue bronchodilators, vitamin D3, C and zinc.  Severely elevated D-dimer D-dimer greater than 20 CTA chest negative for PE Obtain bilateral lower extremity ultrasound to rule out DVT  Type 2 diabetes with hyperglycemia in the setting of IV steroids Hemoglobin A1c 11.6 on 02/03/2020 Continue insulin coverage  AKI on CKD 3A Baseline creatinine appears to be 1.1 with GFR 53 Presented with creatinine of 1.5 Continue to monitor nephrotoxins Monitor urine output Repeat renal panel in the morning  Essential hypertension BP is at goal Continue home meds  Hyperlipidemia Continue home statin  Hypothyroidism- Continue home Synthroid.    Code Status: Full code.  Family Communication: None at bedside.  Disposition Plan: Likely home on 02/07/2020.   Consultants:  None.  Procedures:  None.  Antimicrobials:  Rocephin and azithromycin.  DVT prophylaxis: Subcu Lovenox daily  Status is: Inpatient    Dispo:  Patient From: Home  Planned Disposition: Home with Health Care Svc  Expected discharge date: 02/09/2020, ongoing treatment of COVID-19 viral pneumonia superimposed by bacterial pneumonia.  Medically stable for discharge: No         Objective: Vitals:   02/05/20 2157 02/06/20 0200 02/06/20 0600 02/06/20 1439  BP: (!) 147/84 (!) 144/89 (!) 145/80 (!) 154/78  Pulse: 84 78 79 86  Resp: (!) 21 18 19    Temp: 98.7 F (37.1 C) 97.8 F (36.6  C) 98.5 F (36.9 C) 98.6 F (37 C)  TempSrc: Oral Oral Oral Oral  SpO2: 98% 98% 98% 97%  Weight:      Height:        Intake/Output Summary (Last  24 hours) at 02/06/2020 1551 Last data filed at 02/05/2020 2000 Gross per 24 hour  Intake 0 ml  Output 850 ml  Net -850 ml   Filed Weights   02/02/20 1412  Weight: 84 kg    Exam:  . General: 69 y.o. year-old female well developed well nourished in no acute distress.  Alert and oriented x3. . Cardiovascular: Regular rate and rhythm with no rubs or gallops.  No thyromegaly or JVD noted.   Marland Kitchen Respiratory: Mild rales at bases no wheezes noted. Poor inspiratory effort.  .  Abdomen: Soft nontender nondistended with normal bowel sounds x4 quadrants. . Musculoskeletal: No lower extremity edema. 2/4 pulses in all 4 extremities. . Skin: No ulcerative lesions noted or rashes, . Psychiatry: Mood is appropriate for condition and setting   Data Reviewed: CBC: Recent Labs  Lab 02/02/20 1502 02/03/20 0643 02/03/20 2110  WBC 12.0* 10.1 10.1  NEUTROABS 10.0*  --   --   HGB 14.0 14.1 12.7  HCT 43.3 42.1 36.8  MCV 86.8 85.4 80.7  PLT 399 264 517   Basic Metabolic Panel: Recent Labs  Lab 02/03/20 0643 02/03/20 2110 02/04/20 0308 02/05/20 0240 02/06/20 0328  NA 146* 143 138 136 137  K 5.1 4.5 4.7 5.1 4.0  CL 113* 116* 108 104 105  CO2 19* 17* 18* 19* 21*  GLUCOSE 103* 107* 430* 285* 183*  BUN 22 19 23 23  25*  CREATININE 1.24* 1.18* 1.20* 1.22* 1.13*  CALCIUM 9.1 9.1 8.4* 8.7* 8.5*  MG 2.1  --  1.9 1.9 1.8  PHOS 1.7*  --  2.6 2.9 3.5   GFR: Estimated Creatinine Clearance: 49.3 mL/min (A) (by C-G formula based on SCr of 1.13 mg/dL (H)). Liver Function Tests: Recent Labs  Lab 02/03/20 0643 02/03/20 2110 02/04/20 0308 02/05/20 0240 02/06/20 0328  AST 36 35 30 33 26  ALT 12 13 14 14 12   ALKPHOS 71 64 67 68 61  BILITOT 0.6 0.5 0.8 0.9 0.3  PROT 6.5 6.4* 6.2* 6.4* 5.6*  ALBUMIN 2.6* 2.4* 2.4* 2.4* 2.2*   No results for input(s): LIPASE, AMYLASE in the last 168 hours. No results for input(s): AMMONIA in the last 168 hours. Coagulation Profile: Recent Labs  Lab  02/03/20 0643  INR 1.0   Cardiac Enzymes: No results for input(s): CKTOTAL, CKMB, CKMBINDEX, TROPONINI in the last 168 hours. BNP (last 3 results) No results for input(s): PROBNP in the last 8760 hours. HbA1C: No results for input(s): HGBA1C in the last 72 hours. CBG: Recent Labs  Lab 02/05/20 1118 02/05/20 1629 02/05/20 2200 02/06/20 0802 02/06/20 1102  GLUCAP 142* 131* 184* 50* 138*   Lipid Profile: No results for input(s): CHOL, HDL, LDLCALC, TRIG, CHOLHDL, LDLDIRECT in the last 72 hours. Thyroid Function Tests: No results for input(s): TSH, T4TOTAL, FREET4, T3FREE, THYROIDAB in the last 72 hours. Anemia Panel: Recent Labs    02/05/20 0240 02/06/20 0328  FERRITIN 554* 476*   Urine analysis:    Component Value Date/Time   COLORURINE YELLOW 02/02/2020 1502   APPEARANCEUR HAZY (A) 02/02/2020 1502   LABSPEC 1.020 02/02/2020 1502   PHURINE 5.0 02/02/2020 1502   GLUCOSEU >=500 (A) 02/02/2020 1502   HGBUR MODERATE (A) 02/02/2020 1502   BILIRUBINUR NEGATIVE 02/02/2020  [place] negative 05/19/2015 1339   KETONESUR 80 (A) 02/02/2020 1502   PROTEINUR 30 (A) 02/02/2020 1502   UROBILINOGEN 0.2 05/19/2015 1339   NITRITE NEGATIVE 02/02/2020 1502   LEUKOCYTESUR NEGATIVE 02/02/2020 1502   Sepsis Labs: @LABRCNTIP (procalcitonin:4,lacticidven:4)  ) Recent Results (from the past 240 hour(s))  Blood Culture (routine x 2)     Status: None (Preliminary result)   Collection Time: 02/02/20  2:50 PM   Specimen: BLOOD LEFT FOREARM  Result Value Ref Range Status   Specimen Description   Final    BLOOD LEFT FOREARM Performed at [facility], [place] [address], [place], [place] [address]    Special Requests   Final    BOTTLES DRAWN AEROBIC AND ANAEROBIC Blood Culture results may not be optimal due to an inadequate volume of blood received in culture bottles Performed at [place] [address] [place], [place] [address]     Culture   Final    NO GROWTH 4 DAYS Performed at [facility], [place] [address] [place], [place] [address]    Report Status PENDING  Incomplete  Blood Culture (routine x 2)     Status: None (Preliminary result)   Collection Time: 02/02/20  2:55 PM   Specimen: BLOOD RIGHT FOREARM  Result Value Ref Range Status   Specimen Description   Final    BLOOD RIGHT FOREARM Performed at [place] [address] [place], [place] [address]    Special Requests   Final    BOTTLES DRAWN AEROBIC AND ANAEROBIC Blood Culture results may not be optimal due to an inadequate volume of blood received in culture bottles Performed at [place] [address], [place], [place] [address]    Culture   Final    NO GROWTH 4 DAYS Performed at [facility], [place] [address] [place], [place] [address]    Report Status PENDING  Incomplete  Resp Panel by RT-PCR (Flu A&B, Covid) Nasopharyngeal Swab     Status: Abnormal   Collection Time: 02/02/20  3:15 PM   Specimen: Nasopharyngeal Swab; Nasopharyngeal(NP) swabs in vial transport medium  Result Value Ref Range Status   SARS Coronavirus 2 by RT PCR POSITIVE (A) NEGATIVE Final    Comment: RESULT CALLED TO, READ BACK BY AND VERIFIED WITH: WEST,S. RN @1735  ON 12.15.2021 BY COHEN,K (NOTE) SARS-CoV-2 target nucleic acids are DETECTED.  The SARS-CoV-2 RNA is generally detectable in upper respiratory specimens during the acute phase of infection. Positive results are indicative of the presence of the identified virus, but do not rule out bacterial infection or co-infection with other pathogens not detected by the test. Clinical correlation with patient history and other diagnostic information is necessary to determine patient infection status. The expected result is Negative.  Fact Sheet for Patients: EntrepreneurPulse.com.au  Fact Sheet for Healthcare  Providers: IncredibleEmployment.be  This test is not yet approved or cleared by the [place] FDA and  has been authorized for detection and/or diagnosis of SARS-CoV-2 by FDA under an Emergency Use Authorization (EUA).  This EUA will remain in effect (meaning this tes t can be used) for the duration of  the COVID-19 declaration under Section 564(b)(1) of the Act, 21 U.S.C. section 360bbb-3(b)(1), unless the authorization is terminated or revoked sooner.     Influenza A by PCR NEGATIVE NEGATIVE Final   Influenza B by PCR NEGATIVE NEGATIVE Final    Comment: (NOTE) The Xpert Xpress SARS-CoV-2/FLU/RSV  plus assay is intended as an aid in the diagnosis of influenza from Nasopharyngeal swab specimens and should not be used as a sole basis for treatment. Nasal washings and aspirates are unacceptable for Xpert Xpress SARS-CoV-2/FLU/RSV testing.  Fact Sheet for Patients: EntrepreneurPulse.com.au  Fact Sheet for Healthcare Providers: IncredibleEmployment.be  This test is not yet approved or cleared by the [place] FDA and has been authorized for detection and/or diagnosis of SARS-CoV-2 by FDA under an Emergency Use Authorization (EUA). This EUA will remain in effect (meaning this test can be used) for the duration of the COVID-19 declaration under Section 564(b)(1) of the Act, 21 U.S.C. section 360bbb-3(b)(1), unless the authorization is terminated or revoked.  Performed at [facility], [place] [address], [place], [place] [address]   MRSA PCR Screening     Status: None   Collection Time: 02/03/20 11:24 AM   Specimen: Nasal Mucosa; Nasopharyngeal  Result Value Ref Range Status   MRSA by PCR NEGATIVE NEGATIVE Final    Comment:        The GeneXpert MRSA Assay (FDA approved for NASAL specimens only), is one component of a comprehensive MRSA colonization surveillance program. It is not intended to diagnose  MRSA infection nor to guide or monitor treatment for MRSA infections. Performed at [facility], [place] [address], [place], [place] [address]       Studies: DG CHEST PORT 1 VIEW  Result Date: 02/06/2020 CLINICAL DATA:  Weakness, hypoxia, COVID-19, cough EXAM: PORTABLE CHEST 1 VIEW COMPARISON:  02/02/2020 chest radiograph. FINDINGS: Stable cardiomediastinal silhouette with normal heart size. No pneumothorax. No pleural effusion. Moderate patchy opacities throughout peripheral mid to lower lungs bilaterally, not substantially changed. IMPRESSION: Moderate patchy peripheral mid to lower lung opacities bilaterally, not substantially changed, compatible with COVID-19 pneumonia. Electronically Signed   By: Ilona Sorrel M.D.   On: 02/06/2020 14:39    Scheduled Meds: . (feeding supplement) PROSource Plus  30 mL Oral BID BM  . vitamin C  500 mg Oral Daily  . aspirin EC  81 mg Oral Daily  . azithromycin  500 mg Oral QHS  . Chlorhexidine Gluconate Cloth  6 each Topical Daily  . cholecalciferol  1,000 Units Oral Daily  . dexamethasone  6 mg Oral Daily  . enoxaparin (LOVENOX) injection  40 mg Subcutaneous Q24H  . ezetimibe-simvastatin  1 tablet Oral Daily  . feeding supplement (GLUCERNA SHAKE)  237 mL Oral BID BM  . insulin aspart  0-20 Units Subcutaneous TID WC  . insulin aspart  0-5 Units Subcutaneous QHS  . insulin aspart  6 Units Subcutaneous TID WC  . insulin glargine  20 Units Subcutaneous BID  . Ipratropium-Albuterol  1 puff Inhalation BID  . levothyroxine  200 mcg Oral Q0600  . linagliptin  5 mg Oral Daily  . losartan  100 mg Oral Daily  . metoprolol succinate  25 mg Oral Daily  . multivitamin with minerals  1 tablet Oral Daily  . pantoprazole  40 mg Oral Daily  . sodium bicarbonate  650 mg Oral BID  . zinc sulfate  220 mg Oral Daily    Continuous Infusions: . cefTRIAXone (ROCEPHIN)  IV 2 g (02/05/20 2335)     LOS: 4 days     Kayleen Memos, MD [facility] Pager [phone]  If 7PM-7AM, please contact night-coverage [email or web address] Password [facility] 02/06/2020, 3:51 PM

## 2020-02-06 NOTE — Progress Notes (Signed)
SATURATION QUALIFICATIONS: (This note is used to comply with regulatory documentation for home oxygen)  Patient Saturations on Room Air at Rest = 93%  Patient Saturations on Room Air while Ambulating = 85%  Patient Saturations on 3 Liters of oxygen while Ambulating = 90%

## 2020-02-07 ENCOUNTER — Inpatient Hospital Stay (HOSPITAL_COMMUNITY): Payer: Medicare Other

## 2020-02-07 DIAGNOSIS — R7989 Other specified abnormal findings of blood chemistry: Secondary | ICD-10-CM

## 2020-02-07 LAB — GLUCOSE, CAPILLARY
Glucose-Capillary: 56 mg/dL — ABNORMAL LOW (ref 70–99)
Glucose-Capillary: 56 mg/dL — ABNORMAL LOW (ref 70–99)
Glucose-Capillary: 132 mg/dL — ABNORMAL HIGH (ref 70–99)
Glucose-Capillary: 170 mg/dL — ABNORMAL HIGH (ref 70–99)
Glucose-Capillary: 262 mg/dL — ABNORMAL HIGH (ref 70–99)
Glucose-Capillary: 309 mg/dL — ABNORMAL HIGH (ref 70–99)

## 2020-02-07 LAB — PROCALCITONIN: Procalcitonin: 0.18 ng/mL

## 2020-02-07 LAB — COMPREHENSIVE METABOLIC PANEL
ALT: 15 U/L (ref 0–44)
AST: 30 U/L (ref 15–41)
Albumin: 2.3 g/dL — ABNORMAL LOW (ref 3.5–5.0)
Alkaline Phosphatase: 64 U/L (ref 38–126)
Anion gap: 12 (ref 5–15)
BUN: 20 mg/dL (ref 8–23)
CO2: 23 mmol/L (ref 22–32)
Calcium: 8.5 mg/dL — ABNORMAL LOW (ref 8.9–10.3)
Chloride: 106 mmol/L (ref 98–111)
Creatinine, Ser: 0.93 mg/dL (ref 0.44–1.00)
GFR, Estimated: >60 mL/min (ref >60)
Glucose, Bld: 62 mg/dL — ABNORMAL LOW (ref 70–99)
Potassium: 3.3 mmol/L — ABNORMAL LOW (ref 3.5–5.1)
Sodium: 141 mmol/L (ref 135–145)
Total Bilirubin: 0.3 mg/dL (ref 0.3–1.2)
Total Protein: 5.8 g/dL — ABNORMAL LOW (ref 6.5–8.1)

## 2020-02-07 LAB — FERRITIN: Ferritin: 412 ng/mL — ABNORMAL HIGH (ref 11–307)

## 2020-02-07 LAB — CULTURE, BLOOD (ROUTINE X 2)
Culture: NO GROWTH
Culture: NO GROWTH

## 2020-02-07 LAB — C-REACTIVE PROTEIN: CRP: 5.9 mg/dL — ABNORMAL HIGH (ref <1.0)

## 2020-02-07 LAB — PHOSPHORUS: Phosphorus: 3.7 mg/dL (ref 2.5–4.6)

## 2020-02-07 LAB — D-DIMER, QUANTITATIVE: D-Dimer, Quant: >20 ug/mL-FEU — ABNORMAL HIGH (ref 0.00–0.50)

## 2020-02-07 LAB — MAGNESIUM: Magnesium: 1.7 mg/dL (ref 1.7–2.4)

## 2020-02-07 MED ORDER — MAGNESIUM SULFATE 2 GM/50ML IV SOLN
2.0000 g | Freq: Once | INTRAVENOUS | Status: AC
  Administered 2020-02-07: 2 g via INTRAVENOUS
  Filled 2020-02-07: qty 50

## 2020-02-07 MED ORDER — POTASSIUM CHLORIDE CRYS ER 20 MEQ PO TBCR
40.0000 meq | EXTENDED_RELEASE_TABLET | Freq: Two times a day (BID) | ORAL | Status: AC
  Administered 2020-02-07 (×2): 40 meq via ORAL
  Filled 2020-02-07 (×2): qty 2

## 2020-02-07 MED ORDER — FUROSEMIDE 10 MG/ML IJ SOLN
20.0000 mg | Freq: Once | INTRAMUSCULAR | Status: AC
  Administered 2020-02-07: 20 mg via INTRAVENOUS
  Filled 2020-02-07: qty 2

## 2020-02-07 NOTE — Progress Notes (Addendum)
PROGRESS NOTE  [name] EYC:[id number] DOB: 1950-02-23 DOA: 02/02/2020 PCP: [name], DO  HPI/Recap of past 24 hours: [name] a 69 y.o.femalewith medical history significant ofDM2, HTN, hypothyroidism. Patient is unable to give history. History from her son. About 12 days ago the patient was having body aches. They were concerned that she might have COVID. So, she took a COVID test and was found positive. She self isolated after talking to her doctor. She had no respiratory symptoms or fever. However, she felt more and more weak/achy over the last 4 days. Her PO intake was poor. She wasn't checking her sugars. She couldn't keep anything down. Her son called for EMS to bring her to the ED. She was found to be in DKA and started on Endo tool.  COVID-19 PCR was positive.  Procalcitonin was also elevated >2.  Chest x-ray with bilateral infiltrate consistent with atypical/viral pneumonia.  She was started on antibiotics.  Solu-Medrol was deferred initially as there was no hypoxia, later, she was started on po Decadron due to mild hypoxia requiring 2 to 3 L of oxygen. Markedly elevated D-dimer, CTA with no PE, stable bilateral groundglass opacities consistent with COVID PNA.  Bilateral lower extremity Doppler ultrasound ordered to rule out DVT.   She did not consent to using remdesivir or baricitinib on 02/06/2020.  02/07/20: Seen and examined.  Hypoglycemic this morning with CBG of 56.  Long-acting and scheduled short acting insulin held.  She is currently breathing on room air with no difficulty.  O2 saturation 95% on room air.  Assessment/Plan: Active Problems:   DKA (diabetic ketoacidosis) (Max)   Pneumonia due to COVID-19 virus  Sepsis, resolved, 2/2 COVID-19 viral pneumonia, superimposed by bacterial community acquired pneumonia, both POA Presented with wbc 12K, HR 118, RR 27, procalcitonin of 2.03 on 02/02/2020 and positive COVID-19 screening test on  02/02/2020. Evidence of COVID-19 viral pneumonia and bacterial pneumonia on chest x-ray done on 02/02/2020. Completed 5 days of Rocephin and azithromycin which she tolerated well She did not consent to receive remdesivir or baricitinib on 02/06/2020 Currently on room air with O2 saturation of 95%. Continue to trend inflammatory markers  Continue bronchodilators, vitamin D3, C and zinc.  Severely elevated D-dimer D-dimer greater than 20 CTA chest negative for PE Obtain bilateral lower extremity ultrasound to rule out DVT, follow results.  Type 2 diabetes with hyperglycemia, resolved, hypoglycemic this morning. Hemoglobin A1c 11.6 on 02/03/2020 CBG 56 this morning Long-acting and scheduled short acting insulin held. Continue insulin sliding scale Avoid hypoglycemia  Transient hypoglycemia, likely iatrogenic, secondary to insulin overcorrection Decrease insulin doses Closely monitor Avoid hypoglycemia  Hypokalemia Potassium 3.3 Replete and recheck  Hypomagnesemia Magnesium 1.7 Replete and recheck  Resolved AKI Baseline creatinine appears to be 0.9 with GFR greater than 60.   Presented with creatinine of 1.5 She is currently back to her baseline creatinine. Continue to monitor nephrotoxins Continue to monitor urine output  Essential hypertension BP is at goal Continue home meds She is on losartan and Toprol-XL.  Hyperlipidemia Continue home statin  Hypothyroidism- Continue home Synthroid.    Code Status: Full code.  Family Communication: None at bedside.  Disposition Plan: Likely home on 02/08/2020.   Consultants:  None.  Procedures:  None.  Antimicrobials:  Completed 5 days of Rocephin and azithromycin on 02/07/2020.  DVT prophylaxis: Subcu Lovenox daily  Status is: Inpatient    Dispo:  Patient From: Home  Planned Disposition: Home  Expected discharge date: 02/08/2020  Medically stable for discharge: No, management of hypoglycemia and  electrolyte derangement.         Objective: Vitals:   02/06/20 0600 02/06/20 1439 02/06/20 2103 02/07/20 0505  BP: (!) 145/80 (!) 154/78 (!) 145/72 (!) 167/74  Pulse: 79 86 84 81  Resp: 19  18 18   Temp: 98.5 F (36.9 C) 98.6 F (37 C) (!) 97.5 F (36.4 C) 97.9 F (36.6 C)  TempSrc: Oral Oral Oral Oral  SpO2: 98% 97% 96% 95%  Weight:      Height:        Intake/Output Summary (Last 24 hours) at 02/07/2020 1213 Last data filed at 02/07/2020 0745 Gross per 24 hour  Intake 440 ml  Output --  Net 440 ml   Filed Weights   02/02/20 1412  Weight: 84 kg    Exam:  . General: 69 y.o. year-old female weak appearing.  In no acute distress.  Alert and oriented x3.   . Cardiovascular: Regular rate and rhythm no rubs gallops. Marland Kitchen Respiratory: Clear to auscultation no wheezes or rales.  .  Abdomen: Soft nontender no bowel sounds present.  . Musculoskeletal: Trace lower extremity edema bilaterally.   . Skin: No ulcerative lesions. Marland Kitchen Psychiatry: Mood is appropriate for condition and setting.  Data Reviewed: CBC: Recent Labs  Lab 02/02/20 1502 02/03/20 0643 02/03/20 2110  WBC 12.0* 10.1 10.1  NEUTROABS 10.0*  --   --   HGB 14.0 14.1 12.7  HCT 43.3 42.1 36.8  MCV 86.8 85.4 80.7  PLT 399 264 030   Basic Metabolic Panel: Recent Labs  Lab 02/03/20 0643 02/03/20 2110 02/04/20 0308 02/05/20 0240 02/06/20 0328 02/07/20 0330  NA 146* 143 138 136 137 141  K 5.1 4.5 4.7 5.1 4.0 3.3*  CL 113* 116* 108 104 105 106  CO2 19* 17* 18* 19* 21* 23  GLUCOSE 103* 107* 430* 285* 183* 62*  BUN 22 19 23 23  25* 20  CREATININE 1.24* 1.18* 1.20* 1.22* 1.13* 0.93  CALCIUM 9.1 9.1 8.4* 8.7* 8.5* 8.5*  MG 2.1  --  1.9 1.9 1.8 1.7  PHOS 1.7*  --  2.6 2.9 3.5 3.7   GFR: Estimated Creatinine Clearance: 59.8 mL/min (by C-G formula based on SCr of 0.93 mg/dL). Liver Function Tests: Recent Labs  Lab 02/03/20 2110 02/04/20 0308 02/05/20 0240 02/06/20 0328 02/07/20 0330  AST 35 30 33  26 30  ALT 13 14 14 12 15   ALKPHOS 64 67 68 61 64  BILITOT 0.5 0.8 0.9 0.3 0.3  PROT 6.4* 6.2* 6.4* 5.6* 5.8*  ALBUMIN 2.4* 2.4* 2.4* 2.2* 2.3*   No results for input(s): LIPASE, AMYLASE in the last 168 hours. No results for input(s): AMMONIA in the last 168 hours. Coagulation Profile: Recent Labs  Lab 02/03/20 0643  INR 1.0   Cardiac Enzymes: No results for input(s): CKTOTAL, CKMB, CKMBINDEX, TROPONINI in the last 168 hours. BNP (last 3 results) No results for input(s): PROBNP in the last 8760 hours. HbA1C: No results for input(s): HGBA1C in the last 72 hours. CBG: Recent Labs  Lab 02/06/20 2106 02/07/20 0744 02/07/20 0805 02/07/20 0832 02/07/20 1133  GLUCAP 112* 56* 56* 132* 170*   Lipid Profile: No results for input(s): CHOL, HDL, LDLCALC, TRIG, CHOLHDL, LDLDIRECT in the last 72 hours. Thyroid Function Tests: No results for input(s): TSH, T4TOTAL, FREET4, T3FREE, THYROIDAB in the last 72 hours. Anemia Panel: Recent Labs    02/06/20 0328 02/07/20 0330  FERRITIN 476* 412*   Urine  analysis:    Component Value Date/Time   COLORURINE YELLOW 02/02/2020 1502   APPEARANCEUR HAZY (A) 02/02/2020 1502   LABSPEC 1.020 02/02/2020 1502   PHURINE 5.0 02/02/2020 1502   GLUCOSEU >=500 (A) 02/02/2020 1502   HGBUR MODERATE (A) 02/02/2020 1502   BILIRUBINUR NEGATIVE 02/02/2020 1502   BILIRUBINUR negative 05/19/2015 1339   KETONESUR 80 (A) 02/02/2020 1502   PROTEINUR 30 (A) 02/02/2020 1502   UROBILINOGEN 0.2 05/19/2015 1339   NITRITE NEGATIVE 02/02/2020 1502   LEUKOCYTESUR NEGATIVE 02/02/2020 1502   Sepsis Labs: @LABRCNTIP (procalcitonin:4,lacticidven:4)  ) Recent Results (from the past 240 hour(s))  Blood Culture (routine x 2)     Status: None   Collection Time: 02/02/20  2:50 PM   Specimen: BLOOD LEFT FOREARM  Result Value Ref Range Status   Specimen Description   Final    BLOOD LEFT FOREARM Performed at [facility] [address],  [place], [place] [address]    Special Requests   Final    BOTTLES DRAWN AEROBIC AND ANAEROBIC Blood Culture results may not be optimal due to an inadequate volume of blood received in culture bottles Performed at [facility] [address], [place], [place] [address]    Culture   Final    NO GROWTH 5 DAYS Performed at [facility], [place] [address], [place], [place] [address]    Report Status 02/07/2020 FINAL  Final  Blood Culture (routine x 2)     Status: None   Collection Time: 02/02/20  2:55 PM   Specimen: BLOOD RIGHT FOREARM  Result Value Ref Range Status   Specimen Description   Final    BLOOD RIGHT FOREARM Performed at [facility] [address], [place], [place] [address]    Special Requests   Final    BOTTLES DRAWN AEROBIC AND ANAEROBIC Blood Culture results may not be optimal due to an inadequate volume of blood received in culture bottles Performed at [facility] [address], [place], [place] [address]    Culture   Final    NO GROWTH 5 DAYS Performed at [facility], [place] [address] [place], [place] [address]    Report Status 02/07/2020 FINAL  Final  Resp Panel by RT-PCR (Flu A&B, Covid) Nasopharyngeal Swab     Status: Abnormal   Collection Time: 02/02/20  3:15 PM   Specimen: Nasopharyngeal Swab; Nasopharyngeal(NP) swabs in vial transport medium  Result Value Ref Range Status   SARS Coronavirus 2 by RT PCR POSITIVE (A) NEGATIVE Final    Comment: RESULT CALLED TO, READ BACK BY AND VERIFIED WITH: WEST,S. RN @1735  ON 12.15.2021 BY COHEN,K (NOTE) SARS-CoV-2 target nucleic acids are DETECTED.  The SARS-CoV-2 RNA is generally detectable in upper respiratory specimens during the acute phase of infection. Positive results are indicative of the presence of the identified virus, but do not rule out bacterial infection or co-infection with other pathogens not detected by the test. Clinical correlation with  patient history and other diagnostic information is necessary to determine patient infection status. The expected result is Negative.  Fact Sheet for Patients: EntrepreneurPulse.com.au  Fact Sheet for Healthcare Providers: IncredibleEmployment.be  This test is not yet approved or cleared by the [place] FDA and  has been authorized for detection and/or diagnosis of SARS-CoV-2 by FDA under an Emergency Use Authorization (EUA).  This EUA will remain in effect (meaning this tes t can be used) for the duration of  the  COVID-19 declaration under Section 564(b)(1) of the Act, 21 U.S.C. section 360bbb-3(b)(1), unless the authorization is terminated or revoked sooner.     Influenza A by PCR NEGATIVE NEGATIVE Final   Influenza B by PCR NEGATIVE NEGATIVE Final    Comment: (NOTE) The Xpert Xpress SARS-CoV-2/FLU/RSV plus assay is intended as an aid in the diagnosis of influenza from Nasopharyngeal swab specimens and should not be used as a sole basis for treatment. Nasal washings and aspirates are unacceptable for Xpert Xpress SARS-CoV-2/FLU/RSV testing.  Fact Sheet for Patients: EntrepreneurPulse.com.au  Fact Sheet for Healthcare Providers: IncredibleEmployment.be  This test is not yet approved or cleared by the [place] FDA and has been authorized for detection and/or diagnosis of SARS-CoV-2 by FDA under an Emergency Use Authorization (EUA). This EUA will remain in effect (meaning this test can be used) for the duration of the COVID-19 declaration under Section 564(b)(1) of the Act, 21 U.S.C. section 360bbb-3(b)(1), unless the authorization is terminated or revoked.  Performed at [facility], [place] [address] [place], [place] [address]   MRSA PCR Screening     Status: None   Collection Time: 02/03/20 11:24 AM   Specimen: Nasal Mucosa; Nasopharyngeal  Result Value Ref Range Status    MRSA by PCR NEGATIVE NEGATIVE Final    Comment:        The GeneXpert MRSA Assay (FDA approved for NASAL specimens only), is one component of a comprehensive MRSA colonization surveillance program. It is not intended to diagnose MRSA infection nor to guide or monitor treatment for MRSA infections. Performed at [facility], [place] [address], [place], [place] [address]       Studies: DG CHEST PORT 1 VIEW  Result Date: 02/06/2020 CLINICAL DATA:  Weakness, hypoxia, COVID-19, cough EXAM: PORTABLE CHEST 1 VIEW COMPARISON:  02/02/2020 chest radiograph. FINDINGS: Stable cardiomediastinal silhouette with normal heart size. No pneumothorax. No pleural effusion. Moderate patchy opacities throughout peripheral mid to lower lungs bilaterally, not substantially changed. IMPRESSION: Moderate patchy peripheral mid to lower lung opacities bilaterally, not substantially changed, compatible with COVID-19 pneumonia. Electronically Signed   By: Ilona Sorrel M.D.   On: 02/06/2020 14:39   VAS [place] LOWER EXTREMITY VENOUS (DVT)  Result Date: 02/07/2020  Lower Venous DVT Study Indications: Elevated Ddimer.  Risk Factors: COVID 19 positive. Comparison Study: No prior studies. Performing Technologist: Oliver Hum RVT  Examination Guidelines: A complete evaluation includes B-mode imaging, spectral Doppler, color Doppler, and power Doppler as needed of all accessible portions of each vessel. Bilateral testing is considered an integral part of a complete examination. Limited examinations for reoccurring indications may be performed as noted. The reflux portion of the exam is performed with the patient in reverse Trendelenburg.  +---------+---------------+---------+-----------+----------+--------------+ RIGHT    CompressibilityPhasicitySpontaneityPropertiesThrombus Aging +---------+---------------+---------+-----------+----------+--------------+ CFV      Full           Yes      Yes                                  +---------+---------------+---------+-----------+----------+--------------+ SFJ      Full                                                        +---------+---------------+---------+-----------+----------+--------------+ FV Prox  Full                                                        +---------+---------------+---------+-----------+----------+--------------+ FV Mid   Full                                                        +---------+---------------+---------+-----------+----------+--------------+ FV DistalFull                                                        +---------+---------------+---------+-----------+----------+--------------+ PFV      Full                                                        +---------+---------------+---------+-----------+----------+--------------+ POP      Full           Yes      Yes                                 +---------+---------------+---------+-----------+----------+--------------+ PTV      Full                                                        +---------+---------------+---------+-----------+----------+--------------+ PERO     Full                                                        +---------+---------------+---------+-----------+----------+--------------+   +---------+---------------+---------+-----------+----------+--------------+ LEFT     CompressibilityPhasicitySpontaneityPropertiesThrombus Aging +---------+---------------+---------+-----------+----------+--------------+ CFV      Full           Yes      Yes                                 +---------+---------------+---------+-----------+----------+--------------+ SFJ      Full                                                        +---------+---------------+---------+-----------+----------+--------------+ FV Prox  Full                                                         +---------+---------------+---------+-----------+----------+--------------+  FV Mid   Full                                                        +---------+---------------+---------+-----------+----------+--------------+ FV DistalFull                                                        +---------+---------------+---------+-----------+----------+--------------+ PFV      Full                                                        +---------+---------------+---------+-----------+----------+--------------+ POP      Full           Yes      Yes                                 +---------+---------------+---------+-----------+----------+--------------+ PTV      Full                                                        +---------+---------------+---------+-----------+----------+--------------+ PERO     Full                                                        +---------+---------------+---------+-----------+----------+--------------+     Summary: RIGHT: - There is no evidence of deep vein thrombosis in the lower extremity.  - No cystic structure found in the popliteal fossa.  LEFT: - There is no evidence of deep vein thrombosis in the lower extremity.  - No cystic structure found in the popliteal fossa.  *See table(s) above for measurements and observations.    Preliminary     Scheduled Meds: . (feeding supplement) PROSource Plus  30 mL Oral BID BM  . vitamin C  500 mg Oral Daily  . aspirin EC  81 mg Oral Daily  . Chlorhexidine Gluconate Cloth  6 each Topical Daily  . cholecalciferol  1,000 Units Oral Daily  . dexamethasone  6 mg Oral Daily  . enoxaparin (LOVENOX) injection  40 mg Subcutaneous Q24H  . ezetimibe-simvastatin  1 tablet Oral Daily  . feeding supplement (GLUCERNA SHAKE)  237 mL Oral BID BM  . insulin aspart  0-20 Units Subcutaneous TID WC  . insulin aspart  0-5 Units Subcutaneous QHS  . Ipratropium-Albuterol  1 puff Inhalation BID  . levothyroxine   200 mcg Oral Q0600  . linagliptin  5 mg Oral Daily  . losartan  100 mg Oral Daily  . metoprolol succinate  25 mg Oral Daily  . multivitamin with minerals  1 tablet Oral Daily  . pantoprazole  40 mg Oral Daily  .  potassium chloride  40 mEq Oral BID  . sodium bicarbonate  650 mg Oral BID  . zinc sulfate  220 mg Oral Daily    Continuous Infusions:    LOS: 5 days     Kayleen Memos, MD [facility] Pager [phone]  If 7PM-7AM, please contact night-coverage [email or web address] Password [facility] 02/07/2020, 12:13 PM

## 2020-02-07 NOTE — Care Management Important Message (Signed)
Important Message  Patient Details IM Letter given to the Patient. Name: KAMARIYAH TIMBERLAKE MRN: [id number] Date of Birth: 05/22/1950   Medicare Important Message Given:  Yes     Kerin Salen 02/07/2020, 11:37 AM

## 2020-02-07 NOTE — Progress Notes (Signed)
AM CBG 56 this morning. Orange Juice given and patient drank it all. Apple Juice given also. No other signs of hypoglycemia noted. Will recheck CBG in 30 minutes.

## 2020-02-07 NOTE — Progress Notes (Signed)
Bilateral lower extremity venous duplex has been completed. Preliminary results can be found in CV Proc through chart review.   02/07/20 11:38 AM [name] RVT

## 2020-02-07 NOTE — Progress Notes (Signed)
Rechecked AM CBG 132. No concerns from patient at this time.

## 2020-02-07 NOTE — Progress Notes (Signed)
Physical Therapy Treatment Patient Details Name: [name] MRN: [id number] DOB: 03/02/1950 Today's Date: 02/07/2020    History of Present Illness 69 y.o. female with medical history significant of DM2, HTN, hypothyroidism, DVT 2007, NSTEMI admitted to ED on 12/15 with 10-day history of covid +, DKA with CBG 787 upon arrival. CXR demonstrates bilateral covid PNA, d-dimer elevated but no evidence of PE.    PT Comments    Pt progressing with mobility. Continue to recommend HHPT.    Follow Up Recommendations  Home health PT;Supervision for mobility/OOB     Equipment Recommendations  Rolling walker with 5" wheels    Recommendations for Other Services       Precautions / Restrictions Precautions Precautions: Fall Precaution Comments: sats Restrictions Weight Bearing Restrictions: No    Mobility  Bed Mobility Overal bed mobility: Needs Assistance Bed Mobility: Supine to Sit     Supine to sit: Min assist;Min guard;HOB elevated     General bed mobility comments: min assist with trunk,  increased time and effort to scoot to EOB.  Transfers Overall transfer level: Needs assistance   Transfers: Sit to/from Stand Sit to Stand: Min assist;Min guard         General transfer comment: light assist to rise initially. repeatedly STS x3, less assist wtih repeated trials  Ambulation/Gait Ambulation/Gait assistance: Min assist Gait Distance (Feet): 20 Feet (x2) Assistive device: 1 person hand held assist Gait Pattern/deviations: Step-through pattern;Decreased stride length;Trunk flexed Gait velocity: decr   General Gait Details: Min assist to steady. Pt with DOE 2-3/4.  No O2 sat monitor in room to check sats and no dynamap (pt declined to use RW but does need it)   Chief Strategy Officer    Modified Rankin (Stroke Patients Only)       Balance   Sitting-balance support: No upper extremity supported;Feet supported Sitting balance-Leahy  Scale: Good     Standing balance support: Bilateral upper extremity supported;During functional activity Standing balance-Leahy Scale: Fair Standing balance comment: able to static stand without UE support                            Cognition Arousal/Alertness: Awake/alert Behavior During Therapy: WFL for tasks assessed/performed Overall Cognitive Status: Within Functional Limits for tasks assessed                                        Exercises      General Comments        Pertinent Vitals/Pain Pain Assessment: No/denies pain    Home Living                      Prior Function            PT Goals (current goals can now be found in the care plan section) Acute Rehab PT Goals Patient Stated Goal: go home PT Goal Formulation: With patient Time For Goal Achievement: 02/19/20 Potential to Achieve Goals: Good Progress towards PT goals: Progressing toward goals    Frequency    Min 3X/week      PT Plan Current plan remains appropriate    Co-evaluation              AM-PAC PT "6 Clicks" Mobility   Outcome Measure  Help needed  turning from your back to your side while in a flat bed without using bedrails?: A Little Help needed moving from lying on your back to sitting on the side of a flat bed without using bedrails?: A Little Help needed moving to and from a bed to a chair (including a wheelchair)?: A Little Help needed standing up from a chair using your arms (e.g., wheelchair or bedside chair)?: A Little Help needed to walk in hospital room?: A Little Help needed climbing 3-5 steps with a railing? : A Lot 6 Click Score: 17    End of Session Equipment Utilized During Treatment: Oxygen Activity Tolerance: Patient tolerated treatment well Patient left: in bed;with call bell/phone within reach;with bed alarm set   PT Visit Diagnosis: Other abnormalities of gait and mobility (R26.89);Difficulty in walking, not elsewhere  classified (R26.2)     Time: 1188-6773 PT Time Calculation (min) (ACUTE ONLY): 17 min  Charges:  $Gait Training: 8-22 mins                     Baxter Flattery, PT  Acute Rehab Dept ([place]) [phone] Pager [phone]  02/07/2020    [facility] 02/07/2020, 4:32 PM

## 2020-02-08 LAB — GLUCOSE, CAPILLARY
Glucose-Capillary: 199 mg/dL — ABNORMAL HIGH (ref 70–99)
Glucose-Capillary: 292 mg/dL — ABNORMAL HIGH (ref 70–99)

## 2020-02-08 LAB — BASIC METABOLIC PANEL
Anion gap: 12 (ref 5–15)
BUN: 18 mg/dL (ref 8–23)
CO2: 25 mmol/L (ref 22–32)
Calcium: 8.7 mg/dL — ABNORMAL LOW (ref 8.9–10.3)
Chloride: 102 mmol/L (ref 98–111)
Creatinine, Ser: 0.98 mg/dL (ref 0.44–1.00)
GFR, Estimated: >60 mL/min (ref >60)
Glucose, Bld: 203 mg/dL — ABNORMAL HIGH (ref 70–99)
Potassium: 3.8 mmol/L (ref 3.5–5.1)
Sodium: 139 mmol/L (ref 135–145)

## 2020-02-08 LAB — MAGNESIUM: Magnesium: 1.9 mg/dL (ref 1.7–2.4)

## 2020-02-08 LAB — PROCALCITONIN: Procalcitonin: 0.13 ng/mL

## 2020-02-08 MED ORDER — FUROSEMIDE 10 MG/ML IJ SOLN
20.0000 mg | Freq: Once | INTRAMUSCULAR | Status: AC
  Administered 2020-02-08: 20 mg via INTRAVENOUS
  Filled 2020-02-08: qty 2

## 2020-02-08 MED ORDER — LABETALOL HCL 5 MG/ML IV SOLN
10.0000 mg | Freq: Once | INTRAVENOUS | Status: AC
  Administered 2020-02-08: 10 mg via INTRAVENOUS
  Filled 2020-02-08: qty 4

## 2020-02-08 MED ORDER — SIMVASTATIN 40 MG PO TABS: 40.0000 mg | ORAL_TABLET | Freq: Every day | ORAL | Status: DC

## 2020-02-08 MED ORDER — APIXABAN 2.5 MG PO TABS: 2.5000 mg | ORAL_TABLET | Freq: Two times a day (BID) | ORAL | 0 refills | Status: DC

## 2020-02-08 MED ORDER — APIXABAN 2.5 MG PO TABS
2.5000 mg | ORAL_TABLET | Freq: Two times a day (BID) | ORAL | Status: DC
  Administered 2020-02-08: 2.5 mg via ORAL
  Filled 2020-02-08: qty 1

## 2020-02-08 MED ORDER — ZINC SULFATE 220 (50 ZN) MG PO CAPS: 220.0000 mg | ORAL_CAPSULE | Freq: Every day | ORAL | 0 refills | Status: AC

## 2020-02-08 MED ORDER — EZETIMIBE 10 MG PO TABS: 10.0000 mg | ORAL_TABLET | Freq: Every day | ORAL | Status: DC

## 2020-02-08 MED ORDER — METOPROLOL SUCCINATE ER 25 MG PO TB24: 25.0000 mg | ORAL_TABLET | Freq: Every day | ORAL | 0 refills | Status: DC

## 2020-02-08 MED ORDER — POTASSIUM CHLORIDE CRYS ER 20 MEQ PO TBCR
20.0000 meq | EXTENDED_RELEASE_TABLET | Freq: Once | ORAL | Status: AC
  Administered 2020-02-08: 20 meq via ORAL
  Filled 2020-02-08: qty 1

## 2020-02-08 MED ORDER — ADULT MULTIVITAMIN W/MINERALS CH: 1.0000 | ORAL_TABLET | Freq: Every day | ORAL | 0 refills | Status: AC

## 2020-02-08 MED ORDER — LOSARTAN POTASSIUM 100 MG PO TABS: 100.0000 mg | ORAL_TABLET | Freq: Every day | ORAL | 0 refills | Status: AC

## 2020-02-08 MED ORDER — VITAMIN D3 25 MCG PO TABS: 1000.0000 [IU] | ORAL_TABLET | Freq: Every day | ORAL | 0 refills | Status: AC

## 2020-02-08 MED ORDER — ALBUTEROL SULFATE HFA 108 (90 BASE) MCG/ACT IN AERS: 2.0000 | INHALATION_SPRAY | Freq: Four times a day (QID) | RESPIRATORY_TRACT | 0 refills | Status: AC | PRN

## 2020-02-08 MED ORDER — PREDNISONE 10 MG PO TABS: 10.0000 mg | ORAL_TABLET | Freq: Every day | ORAL | 0 refills | Status: DC

## 2020-02-08 MED ORDER — ASCORBIC ACID 500 MG PO TABS: 500.0000 mg | ORAL_TABLET | Freq: Every day | ORAL | 0 refills | Status: AC

## 2020-02-08 NOTE — Discharge Instructions (Addendum)
Community-Acquired Pneumonia, Adult Pneumonia is an infection of the lungs. It causes swelling in the airways of the lungs. Mucus and fluid may also build up inside the airways. One type of pneumonia can happen while a person is in a hospital. A different type can happen when a person is not in a hospital (community-acquired pneumonia).  What are the causes?  This condition is caused by germs (viruses, bacteria, or fungi). Some types of germs can be passed from one person to another. This can happen when you breathe in droplets from the cough or sneeze of an infected person. What increases the risk? You are more likely to develop this condition if you:  Have a long-term (chronic) disease, such as: ? Chronic obstructive pulmonary disease (COPD). ? Asthma. ? Cystic fibrosis. ? Congestive heart failure. ? Diabetes. ? Kidney disease.  Have HIV.  Have sickle cell disease.  Have had your spleen removed.  Do not take good care of your teeth and mouth (poor dental hygiene).  Have a medical condition that increases the risk of breathing in droplets from your own mouth and nose.  Have a weakened body defense system (immune system).  Are a smoker.  Travel to areas where the germs that cause this illness are common.  Are around certain animals or the places they live. What are the signs or symptoms?  A dry cough.  A wet (productive) cough.  Fever.  Sweating.  Chest pain. This often happens when breathing deeply or coughing.  Fast breathing or trouble breathing.  Shortness of breath.  Shaking chills.  Feeling tired (fatigue).  Muscle aches. How is this treated? Treatment for this condition depends on many things. Most adults can be treated at home. In some cases, treatment must happen in a hospital. Treatment may include:  Medicines given by mouth or through an IV tube.  Being given extra oxygen.  Respiratory therapy. In rare cases, treatment for very bad pneumonia  may include:  Using a machine to help you breathe.  Having a procedure to remove fluid from around your lungs. Follow these instructions at home: Medicines  Take over-the-counter and prescription medicines only as told by your doctor. ? Only take cough medicine if you are losing sleep.  If you were prescribed an antibiotic medicine, take it as told by your doctor. Do not stop taking the antibiotic even if you start to feel better. General instructions   Sleep with your head and neck raised (elevated). You can do this by sleeping in a recliner or by putting a few pillows under your head.  Rest as needed. Get at least 8 hours of sleep each night.  Drink enough water to keep your pee (urine) pale yellow.  Eat a healthy diet that includes plenty of vegetables, fruits, whole grains, low-fat dairy products, and lean protein.  Do not use any products that contain nicotine or tobacco. These include cigarettes, e-cigarettes, and chewing tobacco. If you need help quitting, ask your doctor.  Keep all follow-up visits as told by your doctor. This is important. How is this prevented? A shot (vaccine) can help prevent pneumonia. Shots are often suggested for:  People older than 69 years of age.  People older than 69 years of age who: ? Are having cancer treatment. ? Have long-term (chronic) lung disease. ? Have problems with their body's defense system. You may also prevent pneumonia if you take these actions:  Get the flu (influenza) shot every year.  Go to the dentist as   often as told.  Wash your hands often. If you cannot use soap and water, use hand sanitizer. Contact a doctor if:  You have a fever.  You lose sleep because your cough medicine does not help. Get help right away if:  You are short of breath and it gets worse.  You have more chest pain.  Your sickness gets worse. This is very serious if: ? You are an older adult. ? Your body's defense system is weak.  You  cough up blood. Summary  Pneumonia is an infection of the lungs.  Most adults can be treated at home. Some will need treatment in a hospital.  Drink enough water to keep your pee pale yellow.  Get at least 8 hours of sleep each night. This information is not intended to replace advice given to you by your health care provider. Make sure you discuss any questions you have with your health care provider. Document Revised: 05/27/2018 Document Reviewed: 10/02/2017 Elsevier Patient Education  [place]. Hypoxemia  Hypoxemia occurs when the blood does not contain enough oxygen. The body cannot work well when it does not have enough oxygen because every part of the body needs oxygen. Oxygen enters the lungs when we breathe in, then it travels to all parts of the body through the blood. Hypoxemia can develop suddenly or slowly. What are the causes? Common causes of this condition include:  Long-term (chronic) lung diseases, such as chronic obstructive pulmonary disease (COPD) or interstitial lung disease.  Disorders that affect breathing at night, such as sleep apnea.  Fluid buildup in the lungs (pulmonary edema).  Lung infection (pneumonia).  Lung or throat cancer.  Abnormal blood flow that bypasses the lungs (having a shunt).  Certain diseases that affect nerves or muscles.  A collapsed lung (pneumothorax).  A blood clot in the lungs (pulmonary embolus).  Certain types of heart disease.  Slow or shallow breathing (hypoventilation).  Certain medicines.  High altitudes.  Toxic chemicals, smoke, and gases. What are the signs or symptoms? In some cases, there may be no symptoms of this condition. If you do have symptoms, they may include:  Shortness of breath (dyspnea).  Bluish color of the skin, lips, or nail beds.  Breathing that is fast, noisy, or shallow.  A fast heartbeat.  Feeling tired or sleepy.  Feeling confused or worried. If hypoxemia develops  quickly, you will likely have dyspnea. If hypoxemia develops slowly over months or years, you may not notice any symptoms. How is this diagnosed? This condition is diagnosed by:  A physical exam.  Blood tests.  A test that measures the percentage of oxygen in your blood (pulse oximetry). This is done with a sensor that is placed on your finger, toe, or earlobe. How is this treated? Treatment for this condition depends on the underlying cause of your hypoxemia. You will likely be treated with oxygen therapy to restore your blood oxygen level. Depending on the cause of your hypoxemia, you may need oxygen therapy for a short time (weeks or months), or you may need it for the rest of your life. Your health care provider may also recommend other therapies to treat the underlying cause of your hypoxemia. Follow these instructions at home:   Take over-the-counter and prescription medicines only as told by your health care provider.  If you are on oxygen therapy, follow oxygen safety precautions as directed by your health care provider. These may include: ? Always having a backup supply of oxygen. ?  Not allowing anyone to smoke or have a fire around oxygen. ? Handling oxygen tanks carefully and as instructed.  Do not use any products that contain nicotine or tobacco, such as cigarettes and e-cigarettes. If you need help quitting, ask your health care provider. Stay away from people who smoke.  Keep all follow-up visits as told by your health care provider. This is important. Contact a health care provider if:  You have any concerns about your oxygen therapy.  You have trouble breathing, even during or after treatment.  You become short of breath when you exercise.  You are tired when you wake up.  You have a headache when you wake up. Get help right away if:  Your shortness of breath gets worse, especially with normal or minimal activity.  You have a bluish color of the skin, lips, or  nail beds.  You become confused or you cannot think properly.  You cough up dark mucus or blood.  You have chest pain.  You have a fever. Summary  Hypoxemia occurs when the blood does not contain enough oxygen.  Hypoxemia may or may not cause symptoms. Often, the main symptom is shortness of breath (dyspnea).  Depending on the cause of your hypoxemia, you may need oxygen therapy for a short time (weeks or months), or you may need it for the rest of your life.  If you are on oxygen therapy, follow oxygen safety precautions as directed by your health care provider. This information is not intended to replace advice given to you by your health care provider. Make sure you discuss any questions you have with your health care provider. Document Revised: 11/25/2017 Document Reviewed: 01/09/2016 Elsevier Patient Education  2020 [place]: How to Protect Yourself and Others Know how it spreads  There is currently no vaccine to prevent coronavirus disease 2019 (COVID-19).  The best way to prevent illness is to avoid being exposed to this virus.  The virus is thought to spread mainly from person-to-person. ? Between people who are in close contact with one another (within about 6 feet). ? Through respiratory droplets produced when an infected person coughs, sneezes or talks. ? These droplets can land in the mouths or noses of people who are nearby or possibly be inhaled into the lungs. ? COVID-19 may be spread by people who are not showing symptoms. Everyone should Clean your hands often  Wash your hands often with soap and water for at least 20 seconds especially after you have been in a public place, or after blowing your nose, coughing, or sneezing.  If soap and water are not readily available, use a hand sanitizer that contains at least 60% alcohol. Cover all surfaces of your hands and rub them together until they feel dry.  Avoid touching your eyes, nose, and mouth  with unwashed hands. Avoid close contact  Limit contact with others as much as possible.  Avoid close contact with people who are sick.  Put distance between yourself and other people. ? Remember that some people without symptoms may be able to spread virus. ? This is especially important for people who are at higher risk of getting very GainPain.com.cy Cover your mouth and nose with a mask when around others  You could spread COVID-19 to others even if you do not feel sick.  Everyone should wear a mask in public settings and when around people not living in their household, especially when social distancing is difficult to maintain. ? Masks should  not be placed on young children under age 72, anyone who has trouble breathing, or is unconscious, incapacitated or otherwise unable to remove the mask without assistance.  The mask is meant to protect other people in case you are infected.  Do NOT use a facemask meant for a Dietitian.  Continue to keep about 6 feet between yourself and others. The mask is not a substitute for social distancing. Cover coughs and sneezes  Always cover your mouth and nose with a tissue when you cough or sneeze or use the inside of your elbow.  Throw used tissues in the trash.  Immediately wash your hands with soap and water for at least 20 seconds. If soap and water are not readily available, clean your hands with a hand sanitizer that contains at least 60% alcohol. Clean and disinfect  Clean AND disinfect frequently touched surfaces daily. This includes tables, doorknobs, light switches, countertops, handles, desks, phones, keyboards, toilets, faucets, and sinks. RackRewards.fr  If surfaces are dirty, clean them: Use detergent or soap and water prior to disinfection.  Then, use a household disinfectant. You  can see a list of EPA-registered household disinfectants here. [email or web address].[email or web address] 10/21/2018 This information is not intended to replace advice given to you by your health care provider. Make sure you discuss any questions you have with your health care provider. Document Revised: 10/29/2018 Document Reviewed: 08/27/2018 Elsevier Patient Education  [place].  COVID-19 Frequently Asked Questions COVID-19 (coronavirus disease) is an infection that is caused by a large family of viruses. Some viruses cause illness in people and others cause illness in animals like camels, cats, and bats. In some cases, the viruses that cause illness in animals can spread to humans. Where did the coronavirus come from? In December 2019, [place] told the [facility]) of several cases of lung disease (human respiratory illness). These cases were linked to an open seafood and livestock market in the city of [place]. The link to the seafood and livestock market suggests that the virus may have spread from animals to humans. However, since that first outbreak in December, the virus has also been shown to spread from person to person. What is the name of the disease and the virus? Disease name Early on, this disease was called novel coronavirus. This is because scientists determined that the disease was caused by a new (novel) respiratory virus. The World Health Organization [employer]) has now named the disease COVID-19, or coronavirus disease. Virus name The virus that causes the disease is called severe acute respiratory syndrome coronavirus 2 (SARS-CoV-2). More information on disease and virus naming World Health Organization [facility]): www.who.int/emergencies/diseases/novel-coronavirus-2019/technical-guidance/naming-the-coronavirus-disease-(covid-2019)-and-the-virus-that-causes-it Who is at risk for complications from coronavirus disease? Some people may be at higher risk for complications from  coronavirus disease. This includes older adults and people who have chronic diseases, such as heart disease, diabetes, and lung disease. If you are at higher risk for complications, take these extra precautions:  Stay home as much as possible.  Avoid social gatherings and travel.  Avoid close contact with others. Stay at least 6 ft (2 m) away from others, if possible.  Wash your hands often with soap and water for at least 20 seconds.  Avoid touching your face, mouth, nose, or eyes.  Keep supplies on hand at home, such as food, medicine, and cleaning supplies.  If you must go out in public, wear a cloth face covering or face mask. Make sure your mask covers your nose and mouth. How does  coronavirus disease spread? The virus that causes coronavirus disease spreads easily from person to person (is contagious). You may catch the virus by:  Breathing in droplets from an infected person. Droplets can be spread by a person breathing, speaking, singing, coughing, or sneezing.  Touching something, like a table or a doorknob, that was exposed to the virus (contaminated) and then touching your mouth, nose, or eyes. Can I get the virus from touching surfaces or objects? There is still a lot that we do not know about the virus that causes coronavirus disease. Scientists are basing a lot of information on what they know about similar viruses, such as:  Viruses cannot generally survive on surfaces for long. They need a human body (host) to survive.  It is more likely that the virus is spread by close contact with people who are sick (direct contact), such as through: ? Shaking hands or hugging. ? Breathing in respiratory droplets that travel through the air. Droplets can be spread by a person breathing, speaking, singing, coughing, or sneezing.  It is less likely that the virus is spread when a person touches a surface or object that has the virus on it (indirect contact). The virus may be able to  enter the body if the person touches a surface or object and then touches his or her face, eyes, nose, or mouth. Can a person spread the virus without having symptoms of the disease? It may be possible for the virus to spread before a person has symptoms of the disease, but this is most likely not the main way the virus is spreading. It is more likely for the virus to spread by being in close contact with people who are sick and breathing in the respiratory droplets spread by a person breathing, speaking, singing, coughing, or sneezing. What are the symptoms of coronavirus disease? Symptoms vary from person to person and can range from mild to severe. Symptoms may include:  Fever or chills.  Cough.  Difficulty breathing or feeling short of breath.  Headaches, body aches, or muscle aches.  Runny or stuffy (congested) nose.  Sore throat.  New loss of taste or smell.  Nausea, vomiting, or diarrhea. These symptoms can appear anywhere from 2 to 14 days after you have been exposed to the virus. Some people may not have any symptoms. If you develop symptoms, call your health care provider. People with severe symptoms may need hospital care. Should I be tested for this virus? Your health care provider will decide whether to test you based on your symptoms, history of exposure, and your risk factors. How does a health care provider test for this virus? Health care providers will collect samples to send for testing. Samples may include:  Taking a swab of fluid from the back of your nose and throat, your nose, or your throat.  Taking fluid from the lungs by having you cough up mucus (sputum) into a sterile cup.  Taking a blood sample. Is there a treatment or vaccine for this virus? Currently, there is no vaccine to prevent coronavirus disease. Also, there are no medicines like antibiotics or antivirals to treat the virus. A person who becomes sick is given supportive care, which means rest and  fluids. A person may also relieve his or her symptoms by using over-the-counter medicines that treat sneezing, coughing, and runny nose. These are the same medicines that a person takes for the common cold. If you develop symptoms, call your health care provider. People with  severe symptoms may need hospital care. What can I do to protect myself and my family from this virus?     You can protect yourself and your family by taking the same actions that you would take to prevent the spread of other viruses. Take the following actions:  Wash your hands often with soap and water for at least 20 seconds. If soap and water are not available, use alcohol-based hand sanitizer.  Avoid touching your face, mouth, nose, or eyes.  Cough or sneeze into a tissue, sleeve, or elbow. Do not cough or sneeze into your hand or the air. ? If you cough or sneeze into a tissue, throw it away immediately and wash your hands.  Disinfect objects and surfaces that you frequently touch every day.  Stay away from people who are sick.  Avoid going out in public, follow guidance from your state and local health authorities.  Avoid crowded indoor spaces. Stay at least 6 ft (2 m) away from others.  If you must go out in public, wear a cloth face covering or face mask. Make sure your mask covers your nose and mouth.  Stay home if you are sick, except to get medical care. Call your health care provider before you get medical care. Your health care provider will tell you how long to stay home.  Make sure your vaccines are up to date. Ask your health care provider what vaccines you need. What should I do if I need to travel? Follow travel recommendations from your local health authority, the CDC, and WHO. Travel information and advice  Centers for Disease Control and Prevention (CDC): BodyEditor.hu  World Health Organization [facility]):  ThirdIncome.ca Know the risks and take action to protect your health  You are at higher risk of getting coronavirus disease if you are traveling to areas with an outbreak or if you are exposed to travelers from areas with an outbreak.  Wash your hands often and practice good hygiene to lower the risk of catching or spreading the virus. What should I do if I am sick? General instructions to stop the spread of infection  Wash your hands often with soap and water for at least 20 seconds. If soap and water are not available, use alcohol-based hand sanitizer.  Cough or sneeze into a tissue, sleeve, or elbow. Do not cough or sneeze into your hand or the air.  If you cough or sneeze into a tissue, throw it away immediately and wash your hands.  Stay home unless you must get medical care. Call your health care provider or local health authority before you get medical care.  Avoid public areas. Do not take public transportation, if possible.  If you can, wear a mask if you must go out of the house or if you are in close contact with someone who is not sick. Make sure your mask covers your nose and mouth. Keep your home clean  Disinfect objects and surfaces that are frequently touched every day. This may include: ? Counters and tables. ? Doorknobs and light switches. ? Sinks and faucets. ? Electronics such as phones, remote controls, keyboards, computers, and tablets.  Wash dishes in hot, soapy water or use a dishwasher. Air-dry your dishes.  Wash laundry in hot water. Prevent infecting other household members  Let healthy household members care for children and pets, if possible. If you have to care for children or pets, wash your hands often and wear a mask.  Sleep in a different  bedroom or bed, if possible.  Do not share personal items, such as razors, toothbrushes, deodorant, combs, brushes, towels, and washcloths. Where to find  more information Centers for Disease Control and Prevention (CDC)  Information and news updates: https://www.butler-gonzalez.com/ World Health Organization [facility])  Information and news updates: MissExecutive.com.ee  Coronavirus health topic: https://www.castaneda.info/  Questions and answers on COVID-19: OpportunityDebt.at  Global tracker: who.sprinklr.com American Academy of Pediatrics (AAP)  Information for families: www.healthychildren.org/English/health-issues/conditions/chest-lungs/Pages/2019-Novel-Coronavirus.aspx The coronavirus situation is changing rapidly. Check your local health authority website or the CDC and [facility] websites for updates and news. When should I contact a health care provider?  Contact your health care provider if you have symptoms of an infection, such as fever or cough, and you: ? Have been near anyone who is known to have coronavirus disease. ? Have come into contact with a person who is suspected to have coronavirus disease. ? Have traveled to an area where there is an outbreak of COVID-19. When should I get emergency medical care?  Get help right away by calling your local emergency services (911 in the [place] if you have: ? Trouble breathing. ? Pain or pressure in your chest. ? Confusion. ? Blue-tinged lips and fingernails. ? Difficulty waking from sleep. ? Symptoms that get worse. Let the emergency medical personnel know if you think you have coronavirus disease. Summary  A new respiratory virus is spreading from person to person and causing COVID-19 (coronavirus disease).  The virus that causes COVID-19 appears to spread easily. It spreads from one person to another through droplets from breathing, speaking, singing, coughing, or sneezing.  Older adults and those with chronic diseases are at higher risk of disease. If you are at higher risk for complications, take extra  precautions.  There is currently no vaccine to prevent coronavirus disease. There are no medicines, such as antibiotics or antivirals, to treat the virus.  You can protect yourself and your family by washing your hands often, avoiding touching your face, and covering your coughs and sneezes. This information is not intended to replace advice given to you by your health care provider. Make sure you discuss any questions you have with your health care provider. Document Revised: 12/04/2018 Document Reviewed: 06/02/2018 Elsevier Patient Education  [place].

## 2020-02-08 NOTE — Progress Notes (Signed)
   02/08/20 0607  Assess: MEWS Score  Temp 98 F (36.7 C)  BP (!) 172/87  Level of Consciousness Alert  SpO2 93 %  O2 Device Nasal Cannula  Patient Activity (if Appropriate) In bed  O2 Flow Rate (L/min) 3 L/min  Assess: MEWS Score  MEWS Temp 0  MEWS Systolic 0  MEWS Pulse 0  MEWS RR 2  MEWS LOC 0  MEWS Score 2  MEWS Score Color Yellow  Assess: if the MEWS score is Yellow or Red  Were vital signs taken at a resting state? Yes  Focused Assessment Change from prior assessment (see assessment flowsheet)  Early Detection of Sepsis Score *See Row Information* Low  MEWS guidelines implemented *See Row Information* Yes  Treat  MEWS Interventions Other (Comment) (Md aware)  Pain Scale 0-10  Pain Score 0  Take Vital Signs  Increase Vital Sign Frequency  Yellow: Q 2hr X 2 then Q 4hr X 2, if remains yellow, continue Q 4hrs  Escalate  MEWS: Escalate Yellow: discuss with charge nurse/RN and consider discussing with provider and RRT  Notify: Charge Nurse/RN  Name of Charge Nurse/RN Notified Debbie  Date Charge Nurse/RN Notified 02/08/20  Time Charge Nurse/RN Notified 0737  Notify: Provider  Provider Name/Title Sharlet Salina  Date Provider Notified 02/08/20  Time Provider Notified [phone]  Notification Type Page  Notification Reason Change in status  Response  (no response)  Date of Provider Response 02/08/20  Time of Provider Response [id number]  Document  Patient Outcome Other (Comment) Priscille Loveless)  Progress note created (see row info) Yes

## 2020-02-08 NOTE — Progress Notes (Addendum)
SATURATION QUALIFICATIONS: (This note is used to comply with regulatory documentation for home oxygen)  Patient Saturations on Room Air at Rest = 92%  Patient Saturations on Room Air while Ambulating =83%  Patient Saturations on  3 Liters of oxygen while Ambulating =91%  Please briefly explain why patient needs home oxygen:  Pt requires oxygen to maintain oxygen level as listed above.

## 2020-02-08 NOTE — Discharge Summary (Addendum)
Discharge Summary  [name] CBS:[id number] DOB: [birth year]  PCP: [name], DO  Admit date: 02/02/2020 Discharge date: 02/08/2020  Time spent: 35 minutes  Recommendations for Outpatient Follow-up:  1. Follow-up with your primary care provider in 1 week 2. Follow-up with your cardiologist in 1-2 weeks 3. Quarantine for 14 days from 02/02/2020. 4. Obtain referral from your PCP to hematology  Discharge Diagnoses:  Active Hospital Problems   Diagnosis Date Noted  . Pneumonia due to COVID-19 virus   . DKA (diabetic ketoacidosis) ([place]) 02/02/2020    Resolved Hospital Problems  No resolved problems to display.    Discharge Condition: Stable  Diet recommendation: Heart healthy carb modified diet.  Vitals:   02/08/20 0607 02/08/20 0936  BP: (!) 172/87 (!) 160/72  Pulse:  89  Resp:  18  Temp: 98 F (36.7 C) 98.9 F (37.2 C)  SpO2: 93% 98%    History of present illness:  [name] a 69 y.o.femalewith medical history significant ofNSTEMI, History of DVT, possible Factor V Leiden, she self discontinued her coumadin, DM2, HTN, hypothyroidism.  About 12 days ago the patient was having body aches. They were concerned that she might have COVID. So, she took a COVID test and was found positive. She self isolated after talking to her doctor. She had no respiratory symptoms or fever. However, she felt more and more weak/achy over the last 4 days. Her PO intake was poor. She wasn't checking her sugars. She couldn't keep anything down. Her son called for EMS to bring her to the ED. She was found to be in DKA and started on Endo tool. COVID-19 PCR was positive.  Procalcitonin was also elevated >2. Chest x-ray with bilateral infiltrate consistent with atypical/viral pneumonia. She was started on antibiotics. Solu-Medrol was deferredinitiallyas there was no hypoxia,later, she was started on po Decadron due to hypoxia requiring 2 to 3 L of oxygen.Markedly elevated  D-dimer, CTA with no PE,stable bilateral groundglass opacities consistent with COVID PNA.  Bilateral lower extremity Doppler ultrasound negative for DVT.  She did not consent to using remdesivir or baricitinib on 02/06/2020.  02/08/20: Seen and examined.  No acute events overnight.  No new complaints.  Eager to go home.   Hospital Course:  Active Problems:   DKA (diabetic ketoacidosis) ([facility])   Pneumonia due to COVID-19 virus  Sepsis, resolved, 2/2 community acquired pneumonia, in the setting of covid-19 viral infection, both POA Presented with wbc 12K, HR 118, RR 27, procalcitonin of 2.03 on 02/02/2020, positive COVID-19 screening test on 02/02/2020, and bilateral lungs infiltrates consistent with pneumonia.  Completed 5 days of Rocephin and azithromycin which she tolerated well She did not consent to receive remdesivir or baricitinib on 02/06/2020 Currently on room air with O2 saturation of 92% at rest. Continue bronchodilators, vitamin D3, C and zinc. Continue prednisone taper  Markedly elevated D-dimer D-dimer greater than 20 CTA chest negative for PE B/L LE doppler ultrasound negative for DVT Continue Eliquis at lower dose for DVT prophylaxis 2.5 mg BID x 30 days Follow up with your PCP within a week  Uncontrolled Type 2 diabetes with hyperglycemia Hemoglobin A1c 11.6 on 02/03/2020 Likely exacerbated by steroids Resume home regimen Follow up with your PCP within a week  History of DVT and Factor V Leiden Self discontinued her Coumadin per cardiology's office note Dr. Debara Pickett in 2019 Follow up with Cardiology Obtain referral from your PCP to hematology  Resolved Hypokalemia post repletion Potassium 3.3>> 3.8  Resolved Hypomagnesemia post  repletion Magnesium 1.7>> 1.9  Resolved AKI Baseline creatinine appears to be 0.9 with GFR greater than 60.   Presented with creatinine of 1.5 She is currently back to her baseline creatinine. Continue to avoid  nephrotoxins  Essential hypertension BP is at goal Continue home meds She is on losartan and Toprol-XL.  Hyperlipidemia Continue home statin  Hypothyroidism- Continue home Synthroid. Follow up with your PCP  Ambulatory dysfunction PT recommended PT OT TOC assisting with home health services arrangement DME 5" rolling walker. Continue fall precautionsand PT at home.   Code Status: Full code.  Family Communication: Updated her son [name] via phone on 02/08/20  Disposition Plan: Likely home on 02/08/2020.   Consultants:  None.  Procedures:  None.  Antimicrobials:  Completed 5 days of Rocephin and azithromycin on 02/07/2020.   Discharge Exam: BP (!) 160/72 (BP Location: Right Arm)   Pulse 89   Temp 98.9 F (37.2 C) (Oral)   Resp 18   Ht 5\' 4"  (1.626 m)   Wt 84 kg   SpO2 98%   BMI 31.79 kg/m  . General: 69 y.o. year-old female well developed well nourished in no acute distress.  Alert and oriented x3. . Cardiovascular: Regular rate and rhythm with no rubs or gallops.  No thyromegaly or JVD noted.   Marland Kitchen Respiratory: Clear to auscultation with no wheezes or rales. Good inspiratory effort. . Abdomen: Soft nontender nondistended with normal bowel sounds x4 quadrants. . Musculoskeletal: No lower extremity edema. 2/4 pulses in all 4 extremities. Marland Kitchen Psychiatry: Mood is appropriate for condition and setting  Discharge Instructions You were cared for by a hospitalist during your hospital stay. If you have any questions about your discharge medications or the care you received while you were in the hospital after you are discharged, you can call the unit and asked to speak with the hospitalist on call if the hospitalist that took care of you is not available. Once you are discharged, your primary care physician will handle any further medical issues. Please note that NO REFILLS for any discharge medications will be authorized once you are discharged, as it is  imperative that you return to your primary care physician (or establish a relationship with a primary care physician if you do not have one) for your aftercare needs so that they can reassess your need for medications and monitor your lab values.   Allergies as of 02/08/2020   No Known Allergies     Medication List    TAKE these medications   acetaminophen 500 MG tablet Commonly known as: TYLENOL Take 1,000 mg by mouth every 6 (six) hours as needed for mild pain.   albuterol 108 (90 Base) MCG/ACT inhaler Commonly known as: VENTOLIN HFA Inhale 2 puffs into the lungs every 6 (six) hours as needed for wheezing or shortness of breath.   apixaban 2.5 MG Tabs tablet Commonly known as: Eliquis Take 1 tablet (2.5 mg total) by mouth 2 (two) times daily.   ascorbic acid 500 MG tablet Commonly known as: VITAMIN C Take 1 tablet (500 mg total) by mouth daily. Start taking on: February 09, 2020   aspirin EC 81 MG tablet Take 81 mg by mouth daily.   ezetimibe-simvastatin 10-40 MG tablet Commonly known as: VYTORIN TAKE 1 TABLET BY MOUTH EVERY DAY   insulin glargine 100 UNIT/ML injection Commonly known as: LANTUS Inject 25 Units into the skin at bedtime.   insulin lispro 100 UNIT/ML injection Commonly known as: HUMALOG Inject 5-10 Units  into the skin 3 (three) times daily with meals.   levothyroxine 200 MCG tablet Commonly known as: SYNTHROID Take 200 mcg by mouth daily before breakfast. What changed: Another medication with the same name was removed. Continue taking this medication, and follow the directions you see here.   losartan 100 MG tablet Commonly known as: COZAAR Take 1 tablet (100 mg total) by mouth daily. Start taking on: February 09, 2020 What changed:   medication strength  how much to take  how to take this  when to take this  additional instructions  Another medication with the same name was removed. Continue taking this medication, and follow the  directions you see here.   metFORMIN 500 MG 24 hr tablet Commonly known as: GLUCOPHAGE-XR Take 1,000 mg by mouth 2 (two) times daily.   metoprolol succinate 25 MG 24 hr tablet Commonly known as: TOPROL-XL Take 1 tablet (25 mg total) by mouth daily. Start taking on: February 09, 2020   multivitamin with minerals Tabs tablet Take 1 tablet by mouth daily. Start taking on: February 09, 2020   ondansetron 8 MG disintegrating tablet Commonly known as: ZOFRAN-ODT Take 8 mg by mouth 3 (three) times daily.   pantoprazole 40 MG tablet Commonly known as: PROTONIX Take 1 tablet (40 mg total) by mouth daily at 6 (six) AM.   predniSONE 10 MG tablet Commonly known as: DELTASONE Take 1 tablet (10 mg total) by mouth daily. Take 30 mg daily x2 days, then, Take 20 mg daily x2 days, then, Take 10 mg daily x2 days, then, Take 5 mg daily x2 days, then, stop.   Vitamin D3 25 MCG tablet Commonly known as: Vitamin D Take 1 tablet (1,000 Units total) by mouth daily. Start taking on: February 09, 2020   zinc sulfate 220 (50 Zn) MG capsule Take 1 capsule (220 mg total) by mouth daily. Start taking on: February 09, 2020            Durable Medical Equipment  (From admission, onward)         Start     Ordered   02/08/20 1055  For home use only DME oxygen  Once       Question Answer Comment  Length of Need 6 Months   Mode or (Route) Nasal cannula   Liters per Minute 2   Frequency Continuous (stationary and portable oxygen unit needed)   Oxygen conserving device Yes   Oxygen delivery system Gas      02/08/20 1054   02/08/20 0700  For home use only DME Walker rolling  Once       Question Answer Comment  Walker: With [employer] [place] Wheels   Patient needs a walker to treat with the following condition Ambulatory dysfunction      02/08/20 0558         No Known Allergies  Follow-up Information    [name], DO. Call in 1 day(s).   Specialty: Family Medicine Why: Please call for a post  hospital follow-up appointment. Contact information: [address] [place] [address] [phone]        Pixie Casino, MD .   Specialty: Cardiology Contact information: [place] [address] [phone]                The results of significant diagnostics from this hospitalization (including imaging, microbiology, ancillary and laboratory) are listed below for reference.    Significant Diagnostic Studies: CT ANGIO CHEST  PE W OR WO CONTRAST  Result Date: 02/04/2020 CLINICAL DATA:  Weakness and body aches.  COVID positive. EXAM: CT ANGIOGRAPHY CHEST WITH CONTRAST TECHNIQUE: Multidetector CT imaging of the chest was performed using the standard protocol during bolus administration of intravenous contrast. Multiplanar CT image reconstructions and MIPs were obtained to evaluate the vascular anatomy. CONTRAST:  22mL OMNIPAQUE IOHEXOL 350 MG/ML SOLN COMPARISON:  01/06/2018 FINDINGS: Cardiovascular: Heart size upper normal. No pericardial effusion. No thoracic aortic aneurysm. There is no filling defect within the opacified pulmonary arteries to suggest the presence of an acute pulmonary embolus. Mediastinum/Nodes: No mediastinal lymphadenopathy. There is no hilar lymphadenopathy. The esophagus has normal imaging features. There is no axillary lymphadenopathy. Lungs/Pleura: Peripheral patchy ground-glass opacities identified in both lungs, left minimally more than right. The ground-glass disease is confluent in some regions and demonstrates slight basilar predominance. 8 mm anterior left lower lobe nodule on 89/6 is stable since 2019 consistent with benign etiology. No pleural effusion. Upper Abdomen: Unremarkable. Musculoskeletal: No worrisome lytic or sclerotic osseous abnormality. Review of the MIP images confirms the above findings. IMPRESSION: 1. No CT evidence for acute pulmonary embolus. 2. Peripheral patchy ground-glass opacities in  both lungs, left minimally more than right. Imaging features compatible with multifocal pneumonia and consistent with the reported history of COVID. Electronically Signed   By: Misty Stanley M.D.   On: 02/04/2020 05:49   DG CHEST PORT 1 VIEW  Result Date: 02/06/2020 CLINICAL DATA:  Weakness, hypoxia, COVID-19, cough EXAM: PORTABLE CHEST 1 VIEW COMPARISON:  02/02/2020 chest radiograph. FINDINGS: Stable cardiomediastinal silhouette with normal heart size. No pneumothorax. No pleural effusion. Moderate patchy opacities throughout peripheral mid to lower lungs bilaterally, not substantially changed. IMPRESSION: Moderate patchy peripheral mid to lower lung opacities bilaterally, not substantially changed, compatible with COVID-19 pneumonia. Electronically Signed   By: Ilona Sorrel M.D.   On: 02/06/2020 14:39   DG Chest Port 1 View  Result Date: 02/02/2020 CLINICAL DATA:  Cough, weakness, and chills. Recently tested positive for COVID-19. EXAM: PORTABLE CHEST 1 VIEW COMPARISON:  Chest radiographs and CTA 01/06/2018 FINDINGS: The cardiomediastinal silhouette is within normal limits for portable AP technique. There are interstitial and ill-defined airspace opacities in the mid and lower lungs bilaterally. No sizable pleural effusion or pneumothorax is identified. No acute osseous abnormality is seen. IMPRESSION: Bilateral lung opacities compatible with pneumonia. Electronically Signed   By: Logan Bores M.D.   On: 02/02/2020 14:39   VAS [place] LOWER EXTREMITY VENOUS (DVT)  Result Date: 02/07/2020  Lower Venous DVT Study Indications: Elevated Ddimer.  Risk Factors: COVID 19 positive. Comparison Study: No prior studies. Performing Technologist: Oliver Hum RVT  Examination Guidelines: A complete evaluation includes B-mode imaging, spectral Doppler, color Doppler, and power Doppler as needed of all accessible portions of each vessel. Bilateral testing is considered an integral part of a complete examination.  Limited examinations for reoccurring indications may be performed as noted. The reflux portion of the exam is performed with the patient in reverse Trendelenburg.  +---------+---------------+---------+-----------+----------+--------------+ RIGHT    CompressibilityPhasicitySpontaneityPropertiesThrombus Aging +---------+---------------+---------+-----------+----------+--------------+ CFV      Full           Yes      Yes                                 +---------+---------------+---------+-----------+----------+--------------+ SFJ      Full                                                        +---------+---------------+---------+-----------+----------+--------------+  FV Prox  Full                                                        +---------+---------------+---------+-----------+----------+--------------+ FV Mid   Full                                                        +---------+---------------+---------+-----------+----------+--------------+ FV DistalFull                                                        +---------+---------------+---------+-----------+----------+--------------+ PFV      Full                                                        +---------+---------------+---------+-----------+----------+--------------+ POP      Full           Yes      Yes                                 +---------+---------------+---------+-----------+----------+--------------+ PTV      Full                                                        +---------+---------------+---------+-----------+----------+--------------+ PERO     Full                                                        +---------+---------------+---------+-----------+----------+--------------+   +---------+---------------+---------+-----------+----------+--------------+ LEFT     CompressibilityPhasicitySpontaneityPropertiesThrombus Aging  +---------+---------------+---------+-----------+----------+--------------+ CFV      Full           Yes      Yes                                 +---------+---------------+---------+-----------+----------+--------------+ SFJ      Full                                                        +---------+---------------+---------+-----------+----------+--------------+ FV Prox  Full                                                        +---------+---------------+---------+-----------+----------+--------------+  FV Mid   Full                                                        +---------+---------------+---------+-----------+----------+--------------+ FV DistalFull                                                        +---------+---------------+---------+-----------+----------+--------------+ PFV      Full                                                        +---------+---------------+---------+-----------+----------+--------------+ POP      Full           Yes      Yes                                 +---------+---------------+---------+-----------+----------+--------------+ PTV      Full                                                        +---------+---------------+---------+-----------+----------+--------------+ PERO     Full                                                        +---------+---------------+---------+-----------+----------+--------------+     Summary: RIGHT: - There is no evidence of deep vein thrombosis in the lower extremity.  - No cystic structure found in the popliteal fossa.  LEFT: - There is no evidence of deep vein thrombosis in the lower extremity.  - No cystic structure found in the popliteal fossa.  *See table(s) above for measurements and observations. Electronically signed by Deitra Mayo MD on 02/07/2020 at 4:43:43 PM.    Final     Microbiology: Recent Results (from the past 240 hour(s))  Blood Culture  (routine x 2)     Status: None   Collection Time: 02/02/20  2:50 PM   Specimen: BLOOD LEFT FOREARM  Result Value Ref Range Status   Specimen Description   Final    BLOOD LEFT FOREARM Performed at [facility], [place] [address] [place], [place] [address]    Special Requests   Final    BOTTLES DRAWN AEROBIC AND ANAEROBIC Blood Culture results may not be optimal due to an inadequate volume of blood received in culture bottles Performed at [place] [address], [place], [place] [address]    Culture   Final    NO GROWTH 5 DAYS Performed at [facility], [place] [address], [place], [place] [address]    Report Status 02/07/2020 FINAL  Final  Blood Culture (routine x  2)     Status: None   Collection Time: 02/02/20  2:55 PM   Specimen: BLOOD RIGHT FOREARM  Result Value Ref Range Status   Specimen Description   Final    BLOOD RIGHT FOREARM Performed at [place] [address], [place], [place] [address]    Special Requests   Final    BOTTLES DRAWN AEROBIC AND ANAEROBIC Blood Culture results may not be optimal due to an inadequate volume of blood received in culture bottles Performed at [place] [address], [place], [place] [address]    Culture   Final    NO GROWTH 5 DAYS Performed at [facility], [place] [address], [place], [place] [address]    Report Status 02/07/2020 FINAL  Final  Resp Panel by RT-PCR (Flu A&B, Covid) Nasopharyngeal Swab     Status: Abnormal   Collection Time: 02/02/20  3:15 PM   Specimen: Nasopharyngeal Swab; Nasopharyngeal(NP) swabs in vial transport medium  Result Value Ref Range Status   SARS Coronavirus 2 by RT PCR POSITIVE (A) NEGATIVE Final    Comment: RESULT CALLED TO, READ BACK BY AND VERIFIED WITH: WEST,S. RN @1735  ON 12.15.2021 BY COHEN,K (NOTE) SARS-CoV-2 target nucleic acids are DETECTED.  The SARS-CoV-2 RNA is generally detectable in upper  respiratory specimens during the acute phase of infection. Positive results are indicative of the presence of the identified virus, but do not rule out bacterial infection or co-infection with other pathogens not detected by the test. Clinical correlation with patient history and other diagnostic information is necessary to determine patient infection status. The expected result is Negative.  Fact Sheet for Patients: EntrepreneurPulse.com.au  Fact Sheet for Healthcare Providers: IncredibleEmployment.be  This test is not yet approved or cleared by the [place] FDA and  has been authorized for detection and/or diagnosis of SARS-CoV-2 by FDA under an Emergency Use Authorization (EUA).  This EUA will remain in effect (meaning this tes t can be used) for the duration of  the COVID-19 declaration under Section 564(b)(1) of the Act, 21 U.S.C. section 360bbb-3(b)(1), unless the authorization is terminated or revoked sooner.     Influenza A by PCR NEGATIVE NEGATIVE Final   Influenza B by PCR NEGATIVE NEGATIVE Final    Comment: (NOTE) The Xpert Xpress SARS-CoV-2/FLU/RSV plus assay is intended as an aid in the diagnosis of influenza from Nasopharyngeal swab specimens and should not be used as a sole basis for treatment. Nasal washings and aspirates are unacceptable for Xpert Xpress SARS-CoV-2/FLU/RSV testing.  Fact Sheet for Patients: EntrepreneurPulse.com.au  Fact Sheet for Healthcare Providers: IncredibleEmployment.be  This test is not yet approved or cleared by the [place] FDA and has been authorized for detection and/or diagnosis of SARS-CoV-2 by FDA under an Emergency Use Authorization (EUA). This EUA will remain in effect (meaning this test can be used) for the duration of the COVID-19 declaration under Section 564(b)(1) of the Act, 21 U.S.C. section 360bbb-3(b)(1), unless the authorization is  terminated or revoked.  Performed at [facility], [place] [address] [place], [place] [address]   MRSA PCR Screening     Status: None   Collection Time: 02/03/20 11:24 AM   Specimen: Nasal Mucosa; Nasopharyngeal  Result Value Ref Range Status   MRSA by PCR NEGATIVE NEGATIVE Final    Comment:        The GeneXpert MRSA Assay (FDA approved for NASAL specimens only), is one component of a comprehensive MRSA colonization surveillance  program. It is not intended to diagnose MRSA infection nor to guide or monitor treatment for MRSA infections. Performed at [facility], [place] [address], [place], [place] [address]      Labs: Basic Metabolic Panel: Recent Labs  Lab 02/03/20 [phone] 02/03/20 2110 02/04/20 0308 02/05/20 0240 02/06/20 0328 02/07/20 0330 02/08/20 0751  NA 146*   < > 138 136 137 141 139  K 5.1   < > 4.7 5.1 4.0 3.3* 3.8  CL 113*   < > 108 104 105 106 102  CO2 19*   < > 18* 19* 21* 23 25  GLUCOSE 103*   < > 430* 285* 183* 62* 203*  BUN 22   < > 23 23 25* 20 18  CREATININE 1.24*   < > 1.20* 1.22* 1.13* 0.93 0.98  CALCIUM 9.1   < > 8.4* 8.7* 8.5* 8.5* 8.7*  MG 2.1  --  1.9 1.9 1.8 1.7 1.9  PHOS 1.7*  --  2.6 2.9 3.5 3.7  --    < > = values in this interval not displayed.   Liver Function Tests: Recent Labs  Lab 02/03/20 2110 02/04/20 0308 02/05/20 0240 02/06/20 0328 02/07/20 0330  AST 35 30 33 26 30  ALT 13 14 14 12 15   ALKPHOS 64 67 68 61 64  BILITOT 0.5 0.8 0.9 0.3 0.3  PROT 6.4* 6.2* 6.4* 5.6* 5.8*  ALBUMIN 2.4* 2.4* 2.4* 2.2* 2.3*   No results for input(s): LIPASE, AMYLASE in the last 168 hours. No results for input(s): AMMONIA in the last 168 hours. CBC: Recent Labs  Lab 02/02/20 1502 02/03/20 0643 02/03/20 2110  WBC 12.0* 10.1 10.1  NEUTROABS 10.0*  --   --   HGB 14.0 14.1 12.7  HCT 43.3 42.1 36.8  MCV 86.8 85.4 80.7  PLT 399 264 276   Cardiac Enzymes: No results for input(s): CKTOTAL, CKMB,  CKMBINDEX, TROPONINI in the last 168 hours. BNP: BNP (last 3 results) No results for input(s): BNP in the last 8760 hours.  ProBNP (last 3 results) No results for input(s): PROBNP in the last 8760 hours.  CBG: Recent Labs  Lab 02/07/20 0832 02/07/20 1133 02/07/20 1706 02/07/20 2206 02/08/20 0812  GLUCAP 132* 170* 262* 309* 199*       Signed:  Kayleen Memos, MD [facility] 02/08/2020, 11:52 AM

## 2020-02-08 NOTE — Progress Notes (Signed)
ANTICOAGULATION CONSULT NOTE - Initial Consult  Pharmacy Consult for apixaban Indication: VTE prophylaxis  No Known Allergies  Patient Measurements: Height: 5\' 4"  (162.6 cm) Weight: 84 kg (185 lb 3 oz) IBW/kg (Calculated) : 54.7 Heparin Dosing Weight:   Vital Signs: Temp: 98.9 F (37.2 C) (12/21 0936) Temp Source: Oral (12/21 0936) BP: 160/72 (12/21 0936) Pulse Rate: 89 (12/21 0936)  Labs: Recent Labs    02/06/20 0328 02/07/20 0330 02/08/20 0751  CREATININE 1.13* 0.93 0.98    Estimated Creatinine Clearance: 56.8 mL/min (by C-G formula based on SCr of 0.98 mg/dL).   Medical History: Past Medical History:  Diagnosis Date  . Clotting disorder ([place])   . Constipation    history of  . Diabetes mellitus without complication ([place])   . DVT (deep venous thrombosis) ([place])    2007  . Factor V Leiden ([place])    deficiency   . GERD (gastroesophageal reflux disease)   . Hypertension   . Hypothyroidism   . Kidney disease    early stage  . NSTEMI (non-ST elevated myocardial infarction) ([place])    2011 - possibly r/t DVT/embolization?   . Sleep apnea    no c-pap    Medications:  Scheduled:  . (feeding supplement) PROSource Plus  30 mL Oral BID BM  . vitamin C  500 mg Oral Daily  . aspirin EC  81 mg Oral Daily  . Chlorhexidine Gluconate Cloth  6 each Topical Daily  . cholecalciferol  1,000 Units Oral Daily  . dexamethasone  6 mg Oral Daily  . ezetimibe  10 mg Oral q1800   And  . simvastatin  40 mg Oral q1800  . feeding supplement (GLUCERNA SHAKE)  237 mL Oral BID BM  . insulin aspart  0-20 Units Subcutaneous TID WC  . insulin aspart  0-5 Units Subcutaneous QHS  . Ipratropium-Albuterol  1 puff Inhalation BID  . levothyroxine  200 mcg Oral Q0600  . linagliptin  5 mg Oral Daily  . losartan  100 mg Oral Daily  . metoprolol succinate  25 mg Oral Daily  . multivitamin with minerals  1 tablet Oral Daily  . pantoprazole  40 mg Oral Daily  . sodium bicarbonate  650 mg Oral  BID  . zinc sulfate  220 mg Oral Daily   Infusions:    Assessment: 53 yoF admitted on 12/15 with COVID pneumonia.  PMH includes DVT, Factor V Leiden, but not currently on any anticoagulation.  D-dimer remains > 20, but Dopplers negative for LE DVT and CTa negative for PE.  She has been on prophylactic Lovenox since 02/03/20 with no missed doses.  Pharmacy is consulted to dose prophylactic apixaban - Last Lovenox 40mg  given on 02/07/20 at 21:00. SCr < 1 CBC: Hgb and Plt WNL  Goal of Therapy:  Monitor platelets by anticoagulation protocol: Yes   Plan:  Apixaban 2.5 mg PO BID Pharmacy will sign off notes, but continue to follow up peripherally as needed.  Gretta Arab PharmD, BCPS Clinical Pharmacist [facility] main pharmacy [phone] 02/08/2020 11:51 AM

## 2020-02-08 NOTE — Progress Notes (Signed)
MD order to discharge  Discharge instructions reviewed with pt. Given paperwork to access my chart. Prescriptions given to pt and advised other medications ready for pick up at [facility] per d/c instructions. Pt's son also given d/c instructions via apple iphone facetime.   Both patient and son [name] verbalize d/c instructions. Will show son how to use oxygen tanks once he arrives in parking lot.

## 2020-02-09 ENCOUNTER — Other Ambulatory Visit: Payer: Self-pay | Admitting: Internal Medicine

## 2020-02-10 LAB — GLUCOSE, CAPILLARY
Glucose-Capillary: 154 mg/dL — ABNORMAL HIGH (ref 70–99)
Glucose-Capillary: 231 mg/dL — ABNORMAL HIGH (ref 70–99)

## 2020-02-14 DIAGNOSIS — U071 COVID-19: Secondary | ICD-10-CM | POA: Diagnosis not present

## 2020-02-24 DIAGNOSIS — Z03818 Encounter for observation for suspected exposure to other biological agents ruled out: Secondary | ICD-10-CM | POA: Diagnosis not present

## 2020-03-10 DIAGNOSIS — U071 COVID-19: Secondary | ICD-10-CM | POA: Diagnosis not present

## 2020-03-16 DIAGNOSIS — U071 COVID-19: Secondary | ICD-10-CM | POA: Diagnosis not present

## 2020-03-24 ENCOUNTER — Ambulatory Visit (INDEPENDENT_AMBULATORY_CARE_PROVIDER_SITE_OTHER): Payer: Medicare Other | Admitting: Podiatry

## 2020-03-24 ENCOUNTER — Other Ambulatory Visit: Payer: Self-pay

## 2020-03-24 DIAGNOSIS — B351 Tinea unguium: Secondary | ICD-10-CM | POA: Diagnosis not present

## 2020-03-24 DIAGNOSIS — M2042 Other hammer toe(s) (acquired), left foot: Secondary | ICD-10-CM

## 2020-03-24 DIAGNOSIS — M79675 Pain in left toe(s): Secondary | ICD-10-CM

## 2020-03-24 DIAGNOSIS — L84 Corns and callosities: Secondary | ICD-10-CM

## 2020-03-24 DIAGNOSIS — M2012 Hallux valgus (acquired), left foot: Secondary | ICD-10-CM | POA: Diagnosis not present

## 2020-03-24 DIAGNOSIS — M79674 Pain in right toe(s): Secondary | ICD-10-CM | POA: Diagnosis not present

## 2020-03-24 DIAGNOSIS — Z794 Long term (current) use of insulin: Secondary | ICD-10-CM

## 2020-03-24 DIAGNOSIS — E785 Hyperlipidemia, unspecified: Secondary | ICD-10-CM | POA: Insufficient documentation

## 2020-03-24 DIAGNOSIS — I2510 Atherosclerotic heart disease of native coronary artery without angina pectoris: Secondary | ICD-10-CM | POA: Insufficient documentation

## 2020-03-24 DIAGNOSIS — E119 Type 2 diabetes mellitus without complications: Secondary | ICD-10-CM | POA: Diagnosis not present

## 2020-03-24 DIAGNOSIS — M2011 Hallux valgus (acquired), right foot: Secondary | ICD-10-CM

## 2020-03-24 DIAGNOSIS — E1121 Type 2 diabetes mellitus with diabetic nephropathy: Secondary | ICD-10-CM | POA: Insufficient documentation

## 2020-03-24 DIAGNOSIS — D6851 Activated protein C resistance: Secondary | ICD-10-CM | POA: Insufficient documentation

## 2020-03-24 DIAGNOSIS — M2041 Other hammer toe(s) (acquired), right foot: Secondary | ICD-10-CM | POA: Diagnosis not present

## 2020-03-24 DIAGNOSIS — I252 Old myocardial infarction: Secondary | ICD-10-CM | POA: Insufficient documentation

## 2020-03-24 NOTE — Progress Notes (Signed)
Subjective: [name] presents today referred by Holly Morning, DO for diabetic foot evaluation.  Patient relates 7 year history of diabetes.  Patient denies any history of foot wounds.  Patient denies any history of numbness, tingling, burning, pins/needles sensations.  She states her blood glucose was 149 mg/dl.  She has h/o polio and shorter right lower extremity.  Today, patient c/o of painful, discolored, thick toenails which interfere with daily activities.  Pain is aggravated when wearing enclosed shoe gear.   Past Medical History:  Diagnosis Date  . Clotting disorder ([place])   . Constipation    history of  . Diabetes mellitus without complication ([place])   . DVT (deep venous thrombosis) ([place])    2007  . Factor V Leiden ([place])    deficiency   . GERD (gastroesophageal reflux disease)   . Hypertension   . Hypothyroidism   . Kidney disease    early stage  . NSTEMI (non-ST elevated myocardial infarction) ([place])    2011 - possibly r/t DVT/embolization?   . Sleep apnea    no c-pap    Patient Active Problem List   Diagnosis Date Noted  . Activated protein C resistance ([place]) 03/24/2020  . Coronary artery disease 03/24/2020  . Diabetic nephropathy ([place]) 03/24/2020  . Hyperlipidemia 03/24/2020  . Long term (current) use of insulin ([place]) 03/24/2020  . Old myocardial infarction 03/24/2020  . Pneumonia due to COVID-19 virus   . DKA (diabetic ketoacidosis) ([place]) 02/02/2020  . Chest pain 01/06/2018  . CKD (chronic kidney disease), stage III ([place]) 01/06/2018  . History of DVT (deep vein thrombosis) 07/18/2017  . Right leg pain 07/18/2017  . Swelling of both lower extremities 07/18/2017  . Dyslipidemia 07/18/2017  . Insulin-requiring or dependent type II diabetes mellitus ([place]) 07/01/2016  . Hx of non-ST elevation myocardial infarction (NSTEMI) 07/01/2016  . Screening for lipid disorders 07/01/2016  . Diabetes mellitus ([place]) 06/22/2015  . COUGH 11/27/2007  . PHLEBITIS,  SUPERFICIAL VEINS, UPPER LIMB 09/29/2006  . VENOUS INSUFFICIENCY, CHRONIC, RIGHT LEG 07/30/2006  . Hypothyroidism 04/17/2006  . Essential hypertension 04/17/2006  . DEEP VEIN THROMBOPHLEBITIS, LEG 04/17/2006    Past Surgical History:  Procedure Laterality Date  . CARDIAC CATHETERIZATION  10/21/2009   mild bridging(?) segment in mid LAD, otherwise normal coronaries (Dr. Alma Savage)  . COLONOSCOPY    . INGUINAL HERNIA REPAIR     when 72 months old  . LOWER EXTREMITY ARTERIAL DOPPLER  2011   normal LEA  . SLEEP STUDY  2011   AHI 8.5 and REM AHI 18  . TRANSTHORACIC ECHOCARDIOGRAM  2011   EF 55-60%, trivial MR, TR, pulm valve regurg  . TUBAL LIGATION  1995  . UPPER GASTROINTESTINAL ENDOSCOPY      Current Outpatient Medications on File Prior to Visit  Medication Sig Dispense Refill  . acetaminophen (TYLENOL) 500 MG tablet Take 1,000 mg by mouth every 6 (six) hours as needed for mild pain.    [name] albuterol (VENTOLIN HFA) 108 (90 Base) MCG/ACT inhaler Inhale 2 puffs into the lungs every 6 (six) hours as needed for wheezing or shortness of breath. 8 g 0  . apixaban (ELIQUIS) 2.5 MG TABS tablet Take 1 tablet (2.5 mg total) by mouth 2 (two) times daily. 60 tablet 0  . ascorbic acid (VITAMIN C) 500 MG tablet Take 1 tablet (500 mg total) by mouth daily. 90 tablet 0  . aspirin EC 81 MG tablet Take 81 mg by mouth daily.    [name]  cholecalciferol (VITAMIN D) 25 MCG tablet Take 1 tablet (1,000 Units total) by mouth daily. 90 tablet 0  . ezetimibe-simvastatin (VYTORIN) 10-40 MG tablet TAKE 1 TABLET BY MOUTH EVERY DAY 90 tablet 0  . insulin glargine (LANTUS) 100 UNIT/ML injection Inject 25 Units into the skin at bedtime.    . insulin lispro (HUMALOG) 100 UNIT/ML injection Inject 5-10 Units into the skin 3 (three) times daily with meals.     [name] levothyroxine (SYNTHROID, LEVOTHROID) 200 MCG tablet Take 200 mcg by mouth daily before breakfast.     . losartan (COZAAR) 100 MG tablet Take 1 tablet (100 mg total) by  mouth daily. 90 tablet 0  . metFORMIN (GLUCOPHAGE-XR) 500 MG 24 hr tablet Take 1,000 mg by mouth 2 (two) times daily.    . metoprolol succinate (TOPROL-XL) 25 MG 24 hr tablet Take 1 tablet (25 mg total) by mouth daily. 90 tablet 0  . Multiple Vitamin (MULTIVITAMIN WITH MINERALS) TABS tablet Take 1 tablet by mouth daily. 90 tablet 0  . ondansetron (ZOFRAN-ODT) 8 MG disintegrating tablet Take 8 mg by mouth 3 (three) times daily.    . pantoprazole (PROTONIX) 40 MG tablet Take 1 tablet (40 mg total) by mouth daily at 6 (six) AM. 30 tablet 0  . predniSONE (DELTASONE) 10 MG tablet Take 1 tablet (10 mg total) by mouth daily. Take 30 mg daily x2 days, then, Take 20 mg daily x2 days, then, Take 10 mg daily x2 days, then, Take 5 mg daily x2 days, then, stop. 20 tablet 0  . zinc sulfate 220 (50 Zn) MG capsule Take 1 capsule (220 mg total) by mouth daily. 90 capsule 0   No current facility-administered medications on file prior to visit.     No Known Allergies  Social History   Occupational History  . Occupation: police department    Employer: [employer]  Tobacco Use  . Smoking status: Never Smoker  . Smokeless tobacco: Never Used  Substance and Sexual Activity  . Alcohol use: Yes    Alcohol/week: 0.0 standard drinks    Comment: wine  . Drug use: No  . Sexual activity: Not on file    Family History  Problem Relation Age of Onset  . Lung cancer Father   . Cancer Father   . Diabetes Father   . Other Mother        childbirth - enlarged heart  . Cancer Paternal Grandmother   . Diabetes Paternal Grandmother   . Mental illness Paternal Grandmother   . Hypertension Paternal Grandfather   . Colon cancer Neg Hx     Immunization History  Administered Date(s) Administered  . Td 05/24/2004    Objective: There were no vitals filed for this visit.  ALIA Savage is a pleasant 70 y.o. female in NAD. AAO X 3.  Vascular Examination: Capillary refill time to digits immediate  b/l. Palpable pedal pulses b/l LE. Pedal hair absent. Lower extremity skin temperature gradient within normal limits. No pain with calf compression b/l.  Dermatological Examination: Pedal skin with normal turgor, texture and tone bilaterally. No open wounds bilaterally. No interdigital macerations bilaterally. Toenails 1-5 b/l elongated, discolored, dystrophic, thickened, crumbly with subungual debris and tenderness to dorsal palpation. Hyperkeratotic lesion(s) submet head 1 left foot.  No erythema, no edema, no drainage, no fluctuance.  Musculoskeletal Examination: Normal muscle strength 5/5 to all lower extremity muscle groups bilaterally. Limb length discrepancy of right lower extremity. No pain crepitus or joint limitation noted with  ROM b/l. Hammertoes noted to the 1-5 bilaterally.  Footwear Assessment: Does the patient wear appropriate shoes? Yes. Does the patient need inserts/orthotics? Yes.  Neurological Examination: Protective sensation intact 5/5 intact bilaterally with 10g monofilament b/l. Vibratory sensation intact b/l.  Hemoglobin A1C Latest Ref Rng & Units 02/03/2020  HGBA1C 4.8 - 5.6 % 11.6(H)  Some recent data might be hidden     Assessment: 1. Pain due to onychomycosis of toenails of both feet   2. Callus   3. Hallux valgus, acquired, bilateral   4. Acquired hammertoes of both feet   5. Type 2 diabetes mellitus without complication, with long-term current use of insulin ([place])   6. Encounter for diabetic foot exam ([place])      ADA Risk Categorization:  Low Risk:  Patient has all of the following: Intact protective sensation No prior foot ulcer  No severe deformity Pedal pulses present  Plan: -Examined patient. -Diabetic foot examination performed on today's visit. -Discussed diabetic foot care principles. Literature dispensed on today. -Patient to continue soft, supportive shoe gear daily. -Toenails 1-5 b/l were debrided in length and girth with sterile nail  nippers and dremel without iatrogenic bleeding.  -Callus(es) submet head 1 left foot pared utilizing sterile scalpel blade without complication or incident. Total number debrided =1. -Patient to report any pedal injuries to medical professional immediately. -Patient/POA to call should there be question/concern in the interim.  Return in about 3 months (around 06/21/2020).

## 2020-03-30 ENCOUNTER — Encounter: Payer: Self-pay | Admitting: Podiatry

## 2020-04-10 DIAGNOSIS — U071 COVID-19: Secondary | ICD-10-CM | POA: Diagnosis not present

## 2020-04-16 DIAGNOSIS — U071 COVID-19: Secondary | ICD-10-CM | POA: Diagnosis not present

## 2020-05-03 ENCOUNTER — Other Ambulatory Visit: Payer: Self-pay | Admitting: Internal Medicine

## 2020-05-08 DIAGNOSIS — U071 COVID-19: Secondary | ICD-10-CM | POA: Diagnosis not present

## 2020-05-14 DIAGNOSIS — U071 COVID-19: Secondary | ICD-10-CM | POA: Diagnosis not present

## 2020-05-17 ENCOUNTER — Other Ambulatory Visit: Payer: Self-pay | Admitting: Internal Medicine

## 2020-06-08 DIAGNOSIS — U071 COVID-19: Secondary | ICD-10-CM | POA: Diagnosis not present

## 2020-06-14 DIAGNOSIS — U071 COVID-19: Secondary | ICD-10-CM | POA: Diagnosis not present

## 2020-06-23 ENCOUNTER — Ambulatory Visit
Admission: EM | Admit: 2020-06-23 | Discharge: 2020-06-23 | Disposition: A | Discharge status: Home / Self Care | Payer: Medicare Other | Attending: Internal Medicine | Admitting: Internal Medicine

## 2020-06-23 ENCOUNTER — Other Ambulatory Visit: Payer: Self-pay

## 2020-06-23 DIAGNOSIS — K1379 Other lesions of oral mucosa: Secondary | ICD-10-CM

## 2020-06-23 DIAGNOSIS — Z794 Long term (current) use of insulin: Secondary | ICD-10-CM | POA: Diagnosis not present

## 2020-06-23 DIAGNOSIS — E785 Hyperlipidemia, unspecified: Secondary | ICD-10-CM | POA: Diagnosis not present

## 2020-06-23 DIAGNOSIS — Z13220 Encounter for screening for lipoid disorders: Secondary | ICD-10-CM

## 2020-06-23 DIAGNOSIS — N181 Chronic kidney disease, stage 1: Secondary | ICD-10-CM | POA: Diagnosis not present

## 2020-06-23 DIAGNOSIS — E0822 Diabetes mellitus due to underlying condition with diabetic chronic kidney disease: Secondary | ICD-10-CM

## 2020-06-23 DIAGNOSIS — E0859 Diabetes mellitus due to underlying condition with other circulatory complications: Secondary | ICD-10-CM | POA: Diagnosis not present

## 2020-06-23 DIAGNOSIS — E1165 Type 2 diabetes mellitus with hyperglycemia: Secondary | ICD-10-CM | POA: Diagnosis not present

## 2020-06-23 LAB — POCT FASTING CBG KUC MANUAL ENTRY: POCT Glucose (KUC): 289 mg/dL — AB (ref 70–99)

## 2020-06-23 MED ORDER — MOUTHWASH COMPOUNDING BASE PO LIQD: 15.0000 mL | Freq: Four times a day (QID) | ORAL | 0 refills | Status: DC | PRN

## 2020-06-23 NOTE — ED Provider Notes (Signed)
[facility]    CSN: [id number] Arrival date & time: 06/23/20  0808      History   Chief Complaint Chief Complaint  Patient presents with  . Oral Pain    HPI [name] is a 70 y.o. female comes to the urgent care with complaints of burning sensation in the mouth of 1 week duration.  Patient says that the symptoms started a week ago and has been persistent.  Is associated with white patches on the tongue.  No difficulty swallowing.  No fever or chills.  Patient is a diabetic who is poorly controlled partly because of medication noncompliance.  Her blood sugar has been in the 500s.  Last hemoglobin A1c about 6 months ago was 11.6.  No polyuria polydipsia or polyphagia.  No abdominal pain.   HPI  Past Medical History:  Diagnosis Date  . Clotting disorder ([place])   . Constipation    history of  . Diabetes mellitus without complication ([place])   . DVT (deep venous thrombosis) ([place])    2007  . Factor V Leiden ([place])    deficiency   . GERD (gastroesophageal reflux disease)   . Hypertension   . Hypothyroidism   . Kidney disease    early stage  . NSTEMI (non-ST elevated myocardial infarction) ([place])    2011 - possibly r/t DVT/embolization?   . Sleep apnea    no c-pap    Patient Active Problem List   Diagnosis Date Noted  . Activated protein C resistance ([place]) 03/24/2020  . Coronary artery disease 03/24/2020  . Diabetic nephropathy ([place]) 03/24/2020  . Hyperlipidemia 03/24/2020  . Long term (current) use of insulin ([place]) 03/24/2020  . Old myocardial infarction 03/24/2020  . Pneumonia due to COVID-19 virus   . DKA (diabetic ketoacidosis) ([place]) 02/02/2020  . Chest pain 01/06/2018  . CKD (chronic kidney disease), stage III ([place]) 01/06/2018  . History of DVT (deep vein thrombosis) 07/18/2017  . Right leg pain 07/18/2017  . Swelling of both lower extremities 07/18/2017  . Dyslipidemia 07/18/2017  . Insulin-requiring or dependent type II diabetes mellitus ([place])  07/01/2016  . Hx of non-ST elevation myocardial infarction (NSTEMI) 07/01/2016  . Screening for lipid disorders 07/01/2016  . Diabetes mellitus ([place]) 06/22/2015  . COUGH 11/27/2007  . PHLEBITIS, SUPERFICIAL VEINS, UPPER LIMB 09/29/2006  . VENOUS INSUFFICIENCY, CHRONIC, RIGHT LEG 07/30/2006  . Hypothyroidism 04/17/2006  . Essential hypertension 04/17/2006  . DEEP VEIN THROMBOPHLEBITIS, LEG 04/17/2006    Past Surgical History:  Procedure Laterality Date  . CARDIAC CATHETERIZATION  10/21/2009   mild bridging(?) segment in mid LAD, otherwise normal coronaries (Dr. Alma Friendly)  . COLONOSCOPY    . INGUINAL HERNIA REPAIR     when 43 months old  . LOWER EXTREMITY ARTERIAL DOPPLER  2011   normal LEA  . SLEEP STUDY  2011   AHI 8.5 and REM AHI 18  . TRANSTHORACIC ECHOCARDIOGRAM  2011   EF 55-60%, trivial MR, TR, pulm valve regurg  . TUBAL LIGATION  1995  . UPPER GASTROINTESTINAL ENDOSCOPY      OB History   No obstetric history on file.      Home Medications    Prior to Admission medications   Medication Sig Start Date End Date Taking? Authorizing Provider  Mouthwash Compounding Base LIQD Swish and spit 15 mLs every 6 (six) hours as needed. Maalox 80 mL; nystatin 100,000 units/ML  80 mL; viscous lidocaine 2%- 70ml 06/23/20  Yes Letanya Froh, Myrene Galas, MD  acetaminophen (TYLENOL) 500 MG tablet Take 1,000 mg by mouth every 6 (six) hours as needed for mild pain.    [provider]  albuterol (VENTOLIN HFA) 108 (90 Base) MCG/ACT inhaler Inhale 2 puffs into the lungs every 6 (six) hours as needed for wheezing or shortness of breath. 02/08/20   Kayleen Memos, DO  apixaban (ELIQUIS) 2.5 MG TABS tablet Take 1 tablet (2.5 mg total) by mouth 2 (two) times daily. 02/08/20   Kayleen Memos, DO  aspirin EC 81 MG tablet Take 81 mg by mouth daily.    [provider]  ezetimibe-simvastatin (VYTORIN) 10-40 MG tablet TAKE 1 TABLET BY MOUTH EVERY DAY 05/17/20   Hilty, Nadean Corwin, MD  insulin  glargine (LANTUS) 100 UNIT/ML injection Inject 25 Units into the skin at bedtime.    [provider]  insulin lispro (HUMALOG) 100 UNIT/ML injection Inject 5-10 Units into the skin 3 (three) times daily with meals.     [provider]  levothyroxine (SYNTHROID, LEVOTHROID) 200 MCG tablet Take 200 mcg by mouth daily before breakfast.     [provider]  losartan (COZAAR) 100 MG tablet Take 1 tablet (100 mg total) by mouth daily. 02/09/20 05/09/20  Kayleen Memos, DO  metFORMIN (GLUCOPHAGE-XR) 500 MG 24 hr tablet Take 1,000 mg by mouth 2 (two) times daily.    [provider]  metoprolol succinate (TOPROL-XL) 25 MG 24 hr tablet Take 1 tablet (25 mg total) by mouth daily. 02/09/20 05/09/20  Kayleen Memos, DO  ondansetron (ZOFRAN-ODT) 8 MG disintegrating tablet Take 8 mg by mouth 3 (three) times daily. 01/27/20   [provider]  pantoprazole (PROTONIX) 40 MG tablet Take 1 tablet (40 mg total) by mouth daily at 6 (six) AM. 01/08/18   Regalado, Belkys A, MD  predniSONE (DELTASONE) 10 MG tablet Take 1 tablet (10 mg total) by mouth daily. Take 30 mg daily x2 days, then, Take 20 mg daily x2 days, then, Take 10 mg daily x2 days, then, Take 5 mg daily x2 days, then, stop. Patient not taking: Reported on 06/23/2020 02/08/20   Kayleen Memos, DO    Family History Family History  Problem Relation Age of Onset  . Lung cancer Father   . Cancer Father   . Diabetes Father   . Other Mother        childbirth - enlarged heart  . Cancer Paternal Grandmother   . Diabetes Paternal Grandmother   . Mental illness Paternal Grandmother   . Hypertension Paternal Grandfather   . Colon cancer Neg Hx     Social History Social History   Tobacco Use  . Smoking status: Never Smoker  . Smokeless tobacco: Never Used  Substance Use Topics  . Alcohol use: Yes    Alcohol/week: 0.0 standard drinks    Comment: wine  . Drug use: No     Allergies   Patient has no known  allergies.   Review of Systems Review of Systems  HENT: Negative.  Negative for sore throat.   Eyes: Negative.   Genitourinary: Negative.      Physical Exam Triage Vital Signs ED Triage Vitals  Enc Vitals Group     BP 06/23/20 0821 (!) 152/91     Pulse Rate 06/23/20 0821 81     Resp 06/23/20 0821 18     Temp 06/23/20 0821 98.1 F (36.7 C)     Temp Source 06/23/20 0821 Oral     SpO2 06/23/20 0821  96 %     Weight --      Height --      Head Circumference --      Peak Flow --      Pain Score 06/23/20 0822 5     Pain Loc --      Pain Edu? --      Excl. in [place]? --    No data found.  Updated Vital Signs BP (!) 152/91 (BP Location: Left Arm)   Pulse 81   Temp 98.1 F (36.7 C) (Oral)   Resp 18   SpO2 96%   Visual Acuity Right Eye Distance:   Left Eye Distance:   Bilateral Distance:    Right Eye Near:   Left Eye Near:    Bilateral Near:     Physical Exam HENT:     Right Ear: Tympanic membrane normal.     Left Ear: Tympanic membrane normal.     Mouth/Throat:     Comments: Tongue is slightly coated.  No white patches on the cheeks or on the gums.  Pharynx looks normal.  Full range of motion of the tongue.  No tongue deviation. Abdominal:     General: Abdomen is flat.     Palpations: Abdomen is soft.      UC Treatments / Results  Labs (all labs ordered are listed, but only abnormal results are displayed) Labs Reviewed  HEMOGLOBIN A1C    EKG   Radiology No results found.  Procedures Procedures (including critical care time)  Medications Ordered in UC Medications - No data to display  Initial Impression / Assessment and Plan / UC Course  I have reviewed the triage vital signs and the nursing notes.  Pertinent labs & imaging results that were available during my care of the patient were reviewed by me and considered in my medical decision making (see chart for details).     1.  Oral pain: Mouthwash as needed Return to urgent care if symptoms  worsen  2.  Diabetes mellitus type 2 uncontrolled Point-of-care glucose Hemoglobin A1c at the request of the patient Return precautions given. Final Clinical Impressions(s) / UC Diagnoses   Final diagnoses:  Diabetes mellitus due to underlying condition with stage 1 chronic kidney disease, with long-term current use of insulin (HCC)  Dyslipidemia  Oral pain of unknown etiology  Uncontrolled type 2 diabetes mellitus with hyperglycemia ([place])  Diabetes mellitus due to underlying condition with other circulatory complications [facility])     Discharge Instructions     Use medications as prescribed Use your insulin as directed Increase physical activity Follow-up with primary care physician.   ED Prescriptions    Medication Sig Dispense Auth. Provider   Mouthwash Compounding Base LIQD Swish and spit 15 mLs every 6 (six) hours as needed. Maalox 80 mL; nystatin 100,000 units/ML  80 mL; viscous lidocaine 2%- 48ml 240 mL Breauna Mazzeo, Myrene Galas, MD     PDMP not reviewed this encounter.   Chase Picket, MD 06/23/20 [phone]

## 2020-06-23 NOTE — ED Triage Notes (Signed)
Pt sts her tongue has been burning x 1 week; pt sts thinks could be thrush; pt also requesting her A1C to be checked

## 2020-06-23 NOTE — Discharge Instructions (Addendum)
Use medications as prescribed Use your insulin as directed Increase physical activity Follow-up with primary care physician.

## 2020-06-24 LAB — HEMOGLOBIN A1C
Est. average glucose Bld gHb Est-mCnc: 326 mg/dL
Hgb A1c MFr Bld: 13 % — ABNORMAL HIGH (ref 4.8–5.6)

## 2020-07-04 ENCOUNTER — Encounter: Payer: Self-pay | Admitting: Podiatry

## 2020-07-04 ENCOUNTER — Ambulatory Visit: Payer: Medicare Other | Admitting: Podiatry

## 2020-07-04 ENCOUNTER — Other Ambulatory Visit: Payer: Self-pay

## 2020-07-04 DIAGNOSIS — L84 Corns and callosities: Secondary | ICD-10-CM

## 2020-07-04 DIAGNOSIS — B351 Tinea unguium: Secondary | ICD-10-CM

## 2020-07-04 DIAGNOSIS — Z794 Long term (current) use of insulin: Secondary | ICD-10-CM

## 2020-07-04 DIAGNOSIS — M79675 Pain in left toe(s): Secondary | ICD-10-CM | POA: Diagnosis not present

## 2020-07-04 DIAGNOSIS — M79674 Pain in right toe(s): Secondary | ICD-10-CM | POA: Diagnosis not present

## 2020-07-04 DIAGNOSIS — E119 Type 2 diabetes mellitus without complications: Secondary | ICD-10-CM | POA: Diagnosis not present

## 2020-07-06 DIAGNOSIS — E039 Hypothyroidism, unspecified: Secondary | ICD-10-CM | POA: Diagnosis not present

## 2020-07-06 DIAGNOSIS — I10 Essential (primary) hypertension: Secondary | ICD-10-CM | POA: Diagnosis not present

## 2020-07-06 DIAGNOSIS — E785 Hyperlipidemia, unspecified: Secondary | ICD-10-CM | POA: Diagnosis not present

## 2020-07-06 DIAGNOSIS — E1165 Type 2 diabetes mellitus with hyperglycemia: Secondary | ICD-10-CM | POA: Diagnosis not present

## 2020-07-08 DIAGNOSIS — U071 COVID-19: Secondary | ICD-10-CM | POA: Diagnosis not present

## 2020-07-09 NOTE — Progress Notes (Signed)
  Subjective:  Patient ID: [name], female    DOB: Jun 09, 1950,  MRN: [id number]  70 y.o. female presents with preventative diabetic foot care and callus(es) left foot and painful thick toenails that are difficult to trim. Painful toenails interfere with ambulation. Aggravating factors include wearing enclosed shoe gear. Pain is relieved with periodic professional debridement. Painful calluses are aggravated when weightbearing with and without shoegear. Pain is relieved with periodic professional debridement..    Patient's blood sugar was 74 mg/dl today.  PCP: [name], DO and last visit was: 03/21/2020.  Review of Systems: Negative except as noted in the HPI.   No Known Allergies  Objective:  There were no vitals filed for this visit. Constitutional Patient is a pleasant 70 y.o. African American female in NAD. AAO x 3.  Vascular Capillary refill time to digits immediate b/l. Palpable pedal pulses b/l LE. Pedal hair absent. Lower extremity skin temperature gradient within normal limits. No cyanosis or clubbing noted.  Neurologic Normal speech. Protective sensation intact 5/5 intact bilaterally with 10g monofilament b/l. Vibratory sensation intact b/l.  Dermatologic Pedal skin with normal turgor, texture and tone bilaterally. No open wounds bilaterally. No interdigital macerations bilaterally. Toenails 1-5 b/l elongated, discolored, dystrophic, thickened, crumbly with subungual debris and tenderness to dorsal palpation. Hyperkeratotic lesion(s) submet head 1 left foot.  No erythema, no edema, no drainage, no fluctuance.  Orthopedic: Normal muscle strength 5/5 to all lower extremity muscle groups bilaterally. Limb length discrepancy of right lower extremity. No pain crepitus or joint limitation noted with ROM b/l. Hammertoe(s) noted to the 1-5 bilaterally.   Hemoglobin A1C Latest Ref Rng & Units 06/23/2020 02/03/2020  HGBA1C 4.8 - 5.6 % 13.0(H) 11.6(H)  Some recent data might be hidden    Assessment:   1. Pain due to onychomycosis of toenails of both feet   2. Callus   3. Type 2 diabetes mellitus without complication, with long-term current use of insulin ([place])    Plan:  Patient was evaluated and treated and all questions answered.  Onychomycosis with pain -Nails palliatively debridement as below. -Educated on self-care  Procedure: Nail Debridement Rationale: Pain Type of Debridement: manual, sharp debridement. Instrumentation: Nail nipper, rotary burr. Number of Nails: 10  -Examined patient. -Continue diabetic foot care principles. -Patient to continue soft, supportive shoe gear daily. -Toenails 1-5 b/l were debrided in length and girth with sterile nail nippers and dremel without iatrogenic bleeding.  -Callus(es) submet head 1 left foot pared utilizing sterile scalpel blade without complication or incident. Total number debrided =1. -Patient to report any pedal injuries to medical professional immediately. -Patient/POA to call should there be question/concern in the interim.  Return in about 3 months (around 10/04/2020).  Marzetta Board, DPM

## 2020-07-12 DIAGNOSIS — G4734 Idiopathic sleep related nonobstructive alveolar hypoventilation: Secondary | ICD-10-CM | POA: Diagnosis not present

## 2020-07-12 DIAGNOSIS — Z794 Long term (current) use of insulin: Secondary | ICD-10-CM | POA: Diagnosis not present

## 2020-07-12 DIAGNOSIS — Z0000 Encounter for general adult medical examination without abnormal findings: Secondary | ICD-10-CM | POA: Diagnosis not present

## 2020-07-12 DIAGNOSIS — E0821 Diabetes mellitus due to underlying condition with diabetic nephropathy: Secondary | ICD-10-CM | POA: Diagnosis not present

## 2020-07-12 DIAGNOSIS — E785 Hyperlipidemia, unspecified: Secondary | ICD-10-CM | POA: Diagnosis not present

## 2020-07-12 DIAGNOSIS — I2510 Atherosclerotic heart disease of native coronary artery without angina pectoris: Secondary | ICD-10-CM | POA: Diagnosis not present

## 2020-07-12 DIAGNOSIS — E1159 Type 2 diabetes mellitus with other circulatory complications: Secondary | ICD-10-CM | POA: Diagnosis not present

## 2020-07-12 DIAGNOSIS — S61216A Laceration without foreign body of right little finger without damage to nail, initial encounter: Secondary | ICD-10-CM | POA: Diagnosis not present

## 2020-07-12 DIAGNOSIS — E039 Hypothyroidism, unspecified: Secondary | ICD-10-CM | POA: Diagnosis not present

## 2020-07-12 DIAGNOSIS — B379 Candidiasis, unspecified: Secondary | ICD-10-CM | POA: Diagnosis not present

## 2020-07-12 DIAGNOSIS — I10 Essential (primary) hypertension: Secondary | ICD-10-CM | POA: Diagnosis not present

## 2020-07-14 DIAGNOSIS — U071 COVID-19: Secondary | ICD-10-CM | POA: Diagnosis not present

## 2020-07-27 DIAGNOSIS — Z1152 Encounter for screening for COVID-19: Secondary | ICD-10-CM | POA: Diagnosis not present

## 2020-08-06 ENCOUNTER — Other Ambulatory Visit: Payer: Self-pay

## 2020-08-06 ENCOUNTER — Encounter (HOSPITAL_COMMUNITY): Payer: Self-pay | Admitting: Emergency Medicine

## 2020-08-06 ENCOUNTER — Emergency Department (HOSPITAL_COMMUNITY)
Admission: EM | Admit: 2020-08-06 | Discharge: 2020-08-06 | Disposition: A | Discharge status: Home / Self Care | Payer: Medicare Other | Attending: Emergency Medicine | Admitting: Emergency Medicine

## 2020-08-06 DIAGNOSIS — Z7982 Long term (current) use of aspirin: Secondary | ICD-10-CM | POA: Insufficient documentation

## 2020-08-06 DIAGNOSIS — Z7901 Long term (current) use of anticoagulants: Secondary | ICD-10-CM | POA: Diagnosis not present

## 2020-08-06 DIAGNOSIS — I2510 Atherosclerotic heart disease of native coronary artery without angina pectoris: Secondary | ICD-10-CM | POA: Insufficient documentation

## 2020-08-06 DIAGNOSIS — E1140 Type 2 diabetes mellitus with diabetic neuropathy, unspecified: Secondary | ICD-10-CM | POA: Diagnosis not present

## 2020-08-06 DIAGNOSIS — E1122 Type 2 diabetes mellitus with diabetic chronic kidney disease: Secondary | ICD-10-CM | POA: Insufficient documentation

## 2020-08-06 DIAGNOSIS — E1110 Type 2 diabetes mellitus with ketoacidosis without coma: Secondary | ICD-10-CM | POA: Diagnosis not present

## 2020-08-06 DIAGNOSIS — Z794 Long term (current) use of insulin: Secondary | ICD-10-CM | POA: Diagnosis not present

## 2020-08-06 DIAGNOSIS — E1165 Type 2 diabetes mellitus with hyperglycemia: Secondary | ICD-10-CM | POA: Insufficient documentation

## 2020-08-06 DIAGNOSIS — E039 Hypothyroidism, unspecified: Secondary | ICD-10-CM | POA: Diagnosis not present

## 2020-08-06 DIAGNOSIS — R739 Hyperglycemia, unspecified: Secondary | ICD-10-CM | POA: Diagnosis present

## 2020-08-06 DIAGNOSIS — Z8616 Personal history of COVID-19: Secondary | ICD-10-CM | POA: Diagnosis not present

## 2020-08-06 DIAGNOSIS — N183 Chronic kidney disease, stage 3 unspecified: Secondary | ICD-10-CM | POA: Insufficient documentation

## 2020-08-06 DIAGNOSIS — Z7984 Long term (current) use of oral hypoglycemic drugs: Secondary | ICD-10-CM | POA: Insufficient documentation

## 2020-08-06 DIAGNOSIS — Z79899 Other long term (current) drug therapy: Secondary | ICD-10-CM | POA: Diagnosis not present

## 2020-08-06 DIAGNOSIS — I129 Hypertensive chronic kidney disease with stage 1 through stage 4 chronic kidney disease, or unspecified chronic kidney disease: Secondary | ICD-10-CM | POA: Diagnosis not present

## 2020-08-06 LAB — COMPREHENSIVE METABOLIC PANEL
ALT: 16 U/L (ref 0–44)
AST: 26 U/L (ref 15–41)
Albumin: 4.1 g/dL (ref 3.5–5.0)
Alkaline Phosphatase: 68 U/L (ref 38–126)
Anion gap: 9 (ref 5–15)
BUN: 27 mg/dL — ABNORMAL HIGH (ref 8–23)
CO2: 23 mmol/L (ref 22–32)
Calcium: 9.7 mg/dL (ref 8.9–10.3)
Chloride: 105 mmol/L (ref 98–111)
Creatinine, Ser: 1.26 mg/dL — ABNORMAL HIGH (ref 0.44–1.00)
GFR, Estimated: 46 mL/min — ABNORMAL LOW (ref >60)
Glucose, Bld: 330 mg/dL — ABNORMAL HIGH (ref 70–99)
Potassium: 4.6 mmol/L (ref 3.5–5.1)
Sodium: 137 mmol/L (ref 135–145)
Total Bilirubin: 0.4 mg/dL (ref 0.3–1.2)
Total Protein: 7.7 g/dL (ref 6.5–8.1)

## 2020-08-06 LAB — CBC WITH DIFFERENTIAL/PLATELET
Abs Immature Granulocytes: 0.03 10*3/uL (ref 0.00–0.07)
Basophils Absolute: 0 10*3/uL (ref 0.0–0.1)
Basophils Relative: 0 %
Eosinophils Absolute: 0 10*3/uL (ref 0.0–0.5)
Eosinophils Relative: 1 %
HCT: 38 % (ref 36.0–46.0)
Hemoglobin: 12.6 g/dL (ref 12.0–15.0)
Immature Granulocytes: 0 %
Lymphocytes Relative: 19 %
Lymphs Abs: 1.5 10*3/uL (ref 0.7–4.0)
MCH: 28.2 pg (ref 26.0–34.0)
MCHC: 33.2 g/dL (ref 30.0–36.0)
MCV: 85 fL (ref 80.0–100.0)
Monocytes Absolute: 0.4 10*3/uL (ref 0.1–1.0)
Monocytes Relative: 6 %
Neutro Abs: 5.6 10*3/uL (ref 1.7–7.7)
Neutrophils Relative %: 74 %
Platelets: 262 10*3/uL (ref 150–400)
RBC: 4.47 MIL/uL (ref 3.87–5.11)
RDW: 14.3 % (ref 11.5–15.5)
WBC: 7.6 10*3/uL (ref 4.0–10.5)
nRBC: 0 % (ref 0.0–0.2)

## 2020-08-06 LAB — CBG MONITORING, ED: Glucose-Capillary: 434 mg/dL — ABNORMAL HIGH (ref 70–99)

## 2020-08-06 LAB — MAGNESIUM: Magnesium: 1.8 mg/dL (ref 1.7–2.4)

## 2020-08-06 MED ORDER — NYSTATIN 100000 UNIT/ML MT SUSP: 500000.0000 [IU] | Freq: Four times a day (QID) | OROMUCOSAL | 0 refills | Status: DC

## 2020-08-06 MED ORDER — INSULIN ASPART 100 UNIT/ML IJ SOLN
7.0000 [IU] | Freq: Once | INTRAMUSCULAR | Status: AC
  Administered 2020-08-06: 7 [IU] via INTRAVENOUS
  Filled 2020-08-06: qty 0.07

## 2020-08-06 NOTE — Discharge Instructions (Addendum)
Increase your daily Lantus to 15 units.  Call your endocrinologist tomorrow morning. Your insulin sliding scale at home if your blood Sugar remains high.  Return to the ER if you have fevers pain worsening symptoms or any additional concerns.

## 2020-08-06 NOTE — ED Triage Notes (Signed)
States she has not been feeling well for 'some weeks' and she cannot get her BP down. Endorses thrush in her mouth, states it was trt 3 weeks ago, thought it was gone but it was not. States she is stressed, taking care of 2 family members, reports compliance w/ meds.

## 2020-08-06 NOTE — ED Provider Notes (Signed)
[facility] DEPT Provider Note   CSN: [id number] Arrival date & time: 08/06/20  1739     History Chief Complaint  Patient presents with   Hyperglycemia   [name] is a 70 y.o. female.  Patient presents ER chief complaint of high blood sugars at home over the course the last several months she states.  She is also noticed some thrush for the past few weeks.  She went to urgent care and was given what she calls "Magic mouthwash" however she states it did not really help.  She denies fevers or cough no vomiting or diarrhea.  No headache no chest pain no abdominal pain.  She states she otherwise has not been missing her medications.       Past Medical History:  Diagnosis Date   Clotting disorder ([place])    Constipation    history of   Diabetes mellitus without complication ([place])    DVT (deep venous thrombosis) ([place])    2007   Factor V Leiden ([place])    deficiency    GERD (gastroesophageal reflux disease)    Hypertension    Hypothyroidism    Kidney disease    early stage   NSTEMI (non-ST elevated myocardial infarction) ([place])    2011 - possibly r/t DVT/embolization?    Sleep apnea    no c-pap    Patient Active Problem List   Diagnosis Date Noted   Activated protein C resistance ([place]) 03/24/2020   Coronary artery disease 03/24/2020   Diabetic nephropathy ([place]) 03/24/2020   Hyperlipidemia 03/24/2020   Long term (current) use of insulin ([place]) 03/24/2020   Old myocardial infarction 03/24/2020   Pneumonia due to COVID-19 virus    DKA (diabetic ketoacidosis) ([place]) 02/02/2020   Chest pain 01/06/2018   CKD (chronic kidney disease), stage III ([place]) 01/06/2018   History of DVT (deep vein thrombosis) 07/18/2017   Right leg pain 07/18/2017   Swelling of both lower extremities 07/18/2017   Dyslipidemia 07/18/2017   Insulin-requiring or dependent type II diabetes mellitus ([place]) 07/01/2016   Hx of non-ST elevation myocardial  infarction (NSTEMI) 07/01/2016   Screening for lipid disorders 07/01/2016   Diabetes mellitus ([place]) 06/22/2015   COUGH 11/27/2007   PHLEBITIS, SUPERFICIAL VEINS, UPPER LIMB 09/29/2006   VENOUS INSUFFICIENCY, CHRONIC, RIGHT LEG 07/30/2006   Hypothyroidism 04/17/2006   Essential hypertension 04/17/2006   DEEP VEIN THROMBOPHLEBITIS, LEG 04/17/2006    Past Surgical History:  Procedure Laterality Date   CARDIAC CATHETERIZATION  10/21/2009   mild bridging(?) segment in mid LAD, otherwise normal coronaries (Dr. Alma Friendly)   [place]     when 6 months old   LOWER EXTREMITY ARTERIAL DOPPLER  2011   normal LEA   SLEEP STUDY  2011   AHI 8.5 and REM AHI 18   TRANSTHORACIC ECHOCARDIOGRAM  2011   EF 55-60%, trivial MR, TR, pulm valve regurg   TUBAL LIGATION  1995   UPPER GASTROINTESTINAL ENDOSCOPY       OB History   No obstetric history on file.     Family History  Problem Relation Age of Onset   Lung cancer Father    Cancer Father    Diabetes Father    Other Mother        childbirth - enlarged heart   Cancer Paternal Grandmother    Diabetes Paternal Grandmother    Mental illness Paternal Grandmother    Hypertension Paternal Grandfather  Colon cancer Neg Hx     Social History   Tobacco Use   Smoking status: Never   Smokeless tobacco: Never  Substance Use Topics   Alcohol use: Yes    Alcohol/week: 0.0 standard drinks    Comment: wine   Drug use: No    Home Medications Prior to Admission medications   Medication Sig Start Date End Date Taking? Authorizing Provider  nystatin (MYCOSTATIN) 100000 UNIT/ML suspension Take 5 mLs (500,000 Units total) by mouth 4 (four) times daily. 08/06/20  Yes Luna Fuse, MD  acetaminophen (TYLENOL) 500 MG tablet Take 1,000 mg by mouth every 6 (six) hours as needed for mild pain.    [provider]  albuterol (VENTOLIN HFA) 108 (90 Base) MCG/ACT inhaler Inhale 2 puffs into the lungs every 6 (six)  hours as needed for wheezing or shortness of breath. 02/08/20   Kayleen Memos, DO  apixaban (ELIQUIS) 2.5 MG TABS tablet Take 1 tablet (2.5 mg total) by mouth 2 (two) times daily. 02/08/20   Kayleen Memos, DO  Ascorbic Acid (VITAMIN C) 500 MG CAPS     [provider]  aspirin EC 81 MG tablet Take 81 mg by mouth daily.    [provider]  Blood Glucose Monitoring Suppl (ONETOUCH VERIO) w/Device KIT  02/14/20   [provider]  ezetimibe-simvastatin (VYTORIN) 10-40 MG tablet     [provider]  HYDROcodone bit-homatropine (HYCODAN) 5-1.5 MG/5ML syrup hydrocodone-homatropine 5 mg-1.5 mg/5 mL oral syrup    [provider]  insulin glargine (LANTUS) 100 UNIT/ML injection Inject 25 Units into the skin at bedtime.    [provider]  insulin lispro (HUMALOG) 100 UNIT/ML injection Inject 5-10 Units into the skin 3 (three) times daily with meals.     [provider]  levothyroxine (SYNTHROID, LEVOTHROID) 200 MCG tablet Take 200 mcg by mouth daily before breakfast.     [provider]  losartan (COZAAR) 100 MG tablet Take 1 tablet (100 mg total) by mouth daily. 02/09/20 05/09/20  Kayleen Memos, DO  metFORMIN (GLUCOPHAGE-XR) 500 MG 24 hr tablet Take 1,000 mg by mouth 2 (two) times daily.    [provider]  metoprolol succinate (TOPROL-XL) 25 MG 24 hr tablet Take 1 tablet (25 mg total) by mouth daily. 02/09/20 05/09/20  Kayleen Memos, DO  Mouthwash Compounding Base LIQD Swish and spit 15 mLs every 6 (six) hours as needed. Maalox 80 mL; nystatin 100,000 units/ML  80 mL; viscous lidocaine 2%- 22m 06/23/20   Lamptey, PMyrene Galas MD  Multiple Vitamin (MULTI VITAMIN) TABS     [provider]  naproxen (NAPROSYN) 500 MG tablet naproxen 500 mg tablet    [provider]  ondansetron (ZOFRAN-ODT) 8 MG disintegrating tablet Take 8 mg by mouth 3 (three) times daily. 01/27/20   [provider]  [facility] test  strip  01/25/20   [provider]  pantoprazole (PROTONIX) 40 MG tablet Take 1 tablet (40 mg total) by mouth daily at 6 (six) AM. 01/08/18   Regalado, Belkys A, MD  polyethylene glycol powder (GLYCOLAX/MIRALAX) 17 GM/SCOOP powder polyethylene glycol 3350 17 gram/dose oral powder  TAKE 17GRAMS (1 CAPFUL) EVERY DAY MIXED WITH 8 OZ. WATER, JUICE, SODA, COFFEE OR TEA    [provider]  predniSONE (DELTASONE) 10 MG tablet Take 1 tablet (10 mg total) by mouth daily. Take 30 mg daily x2 days, then, Take 20 mg daily x2 days, then, Take 10 mg daily  x2 days, then, Take 5 mg daily x2 days, then, stop. Patient not taking: Reported on 06/23/2020 02/08/20   Kayleen Memos, DO  Vitamin D, Ergocalciferol, (DRISDOL) 1.25 MG (50000 UNIT) CAPS capsule  03/16/20   [provider]  zolpidem (AMBIEN) 5 MG tablet     [provider]    Allergies    Patient has no known allergies.  Review of Systems   Review of Systems  Constitutional:  Negative for fever.  HENT:  Negative for ear pain.   Eyes:  Negative for pain.  Respiratory:  Negative for cough.   Cardiovascular:  Negative for chest pain.  Gastrointestinal:  Negative for abdominal pain.  Genitourinary:  Negative for flank pain.  Musculoskeletal:  Negative for back pain.  Skin:  Negative for rash.  Neurological:  Negative for headaches.   Physical Exam Updated Vital Signs BP 138/72 (BP Location: Right Arm)   Pulse 79   Temp 98.2 F (36.8 C) (Oral)   Resp 15   Ht 5' 4" (1.626 m)   Wt 83.9 kg   SpO2 99%   BMI 31.76 kg/m   Physical Exam Constitutional:      General: She is not in acute distress.    Appearance: Normal appearance.  HENT:     Head: Normocephalic.     Nose: Nose normal.     Mouth/Throat:     Comments: Posterior pharynx appears very normal.  Questionable scant white thrush on tongue, but does not appear to be affected bucca mucosa. Eyes:     Extraocular Movements: Extraocular movements intact.   Cardiovascular:     Rate and Rhythm: Normal rate.  Pulmonary:     Effort: Pulmonary effort is normal.  Musculoskeletal:        General: Normal range of motion.     Cervical back: Normal range of motion.  Neurological:     General: No focal deficit present.     Mental Status: She is alert. Mental status is at baseline.    ED Results / Procedures / Treatments   Labs (all labs ordered are listed, but only abnormal results are displayed) Labs Reviewed  COMPREHENSIVE METABOLIC PANEL - Abnormal; Notable for the following components:      Result Value   Glucose, Bld 330 (*)    BUN 27 (*)    Creatinine, Ser 1.26 (*)    GFR, Estimated 46 (*)    All other components within normal limits  CBG MONITORING, ED - Abnormal; Notable for the following components:   Glucose-Capillary 434 (*)    All other components within normal limits  CBC WITH DIFFERENTIAL/PLATELET  MAGNESIUM  URINALYSIS, ROUTINE W REFLEX MICROSCOPIC    EKG None  Radiology No results found.  Procedures Procedures   Medications Ordered in ED Medications  insulin aspart (novoLOG) injection 7 Units (has no administration in time range)    ED Course  I have reviewed the triage vital signs and the nursing notes.  Pertinent labs & imaging results that were available during my care of the patient were reviewed by me and considered in my medical decision making (see chart for details).    MDM Rules/Calculators/A&P                          Labs are sent.  Chemistries unremarkable other than blood glucose being elevated.  CBC normal as well.  No evidence of DKA or acidosis.  Patient given insulin here in the  ER.  She states that her endocrinologist had written her for 25 units of Lantus but she has decided on to take 10 units of Lantus daily over the course of last few weeks.  She reportedly did not discuss this with her endocrinologist and her blood sugars have been high.  I advised her to at least increase her  Lantus to 15 units and to call her endocrinologist tomorrow morning.   Final Clinical Impression(s) / ED Diagnoses Final diagnoses:  Hyperglycemia    Rx / DC Orders ED Discharge Orders          Ordered    nystatin (MYCOSTATIN) 100000 UNIT/ML suspension  4 times daily        08/06/20 1954             Luna Fuse, MD 08/06/20 1954

## 2020-08-06 NOTE — ED Provider Notes (Signed)
Emergency Medicine Provider Triage Evaluation Note  ZAN TRISKA , a 70 y.o. female  was evaluated in triage.  Pt complains of thrush, treated with mouthwash. Blood sugar is high today, woke up today feeling cold. CBG 99 this morning. Went out to eat for Fathers Day and now BG high (497), took 15U and dropped to 434. Also recently treated for vaginal yeast with Diflucan x 3.  Review of Systems  Positive: Mouth rash, fatigue Negative: Changes in bowel or bladder habits  Physical Exam  BP (!) 144/75 (BP Location: Left Arm)   Pulse 75   Temp 98.1 F (36.7 C) (Oral)   Resp 16   Ht 5\' 4"  (1.626 m)   Wt 83.9 kg   SpO2 100%   BMI 31.76 kg/m  Gen:   Awake, no distress   Resp:  Normal effort  MSK:   Moves extremities without difficulty  Other:    Medical Decision Making  Medically screening exam initiated at 6:13 PM.  Appropriate orders placed.  Liana Gerold was informed that the remainder of the evaluation will be completed by another provider, this initial triage assessment does not replace that evaluation, and the importance of remaining in the ED until their evaluation is complete.     Tacy Learn, PA-C 08/06/20 1815    Luna Fuse, MD 08/06/20 1946

## 2020-08-08 DIAGNOSIS — U071 COVID-19: Secondary | ICD-10-CM | POA: Diagnosis not present

## 2020-08-14 DIAGNOSIS — U071 COVID-19: Secondary | ICD-10-CM | POA: Diagnosis not present

## 2020-08-17 DIAGNOSIS — Z1231 Encounter for screening mammogram for malignant neoplasm of breast: Secondary | ICD-10-CM | POA: Diagnosis not present

## 2020-09-06 DIAGNOSIS — E119 Type 2 diabetes mellitus without complications: Secondary | ICD-10-CM | POA: Diagnosis not present

## 2020-09-06 DIAGNOSIS — H40013 Open angle with borderline findings, low risk, bilateral: Secondary | ICD-10-CM | POA: Diagnosis not present

## 2020-09-07 DIAGNOSIS — U071 COVID-19: Secondary | ICD-10-CM | POA: Diagnosis not present

## 2020-09-13 DIAGNOSIS — U071 COVID-19: Secondary | ICD-10-CM | POA: Diagnosis not present

## 2020-09-19 DIAGNOSIS — K219 Gastro-esophageal reflux disease without esophagitis: Secondary | ICD-10-CM | POA: Insufficient documentation

## 2020-09-19 DIAGNOSIS — I214 Non-ST elevation (NSTEMI) myocardial infarction: Secondary | ICD-10-CM | POA: Insufficient documentation

## 2020-09-19 DIAGNOSIS — N289 Disorder of kidney and ureter, unspecified: Secondary | ICD-10-CM | POA: Insufficient documentation

## 2020-09-19 DIAGNOSIS — G4730 Sleep apnea, unspecified: Secondary | ICD-10-CM | POA: Insufficient documentation

## 2020-09-19 DIAGNOSIS — I82409 Acute embolism and thrombosis of unspecified deep veins of unspecified lower extremity: Secondary | ICD-10-CM | POA: Insufficient documentation

## 2020-09-19 DIAGNOSIS — D6851 Activated protein C resistance: Secondary | ICD-10-CM | POA: Insufficient documentation

## 2020-09-20 DIAGNOSIS — N6001 Solitary cyst of right breast: Secondary | ICD-10-CM | POA: Insufficient documentation

## 2020-09-20 DIAGNOSIS — R928 Other abnormal and inconclusive findings on diagnostic imaging of breast: Secondary | ICD-10-CM | POA: Diagnosis not present

## 2020-10-06 ENCOUNTER — Ambulatory Visit: Payer: Medicare Other | Admitting: Podiatry

## 2020-10-08 DIAGNOSIS — U071 COVID-19: Secondary | ICD-10-CM | POA: Diagnosis not present

## 2020-10-10 ENCOUNTER — Encounter: Payer: Self-pay | Admitting: Podiatry

## 2020-10-10 ENCOUNTER — Other Ambulatory Visit: Payer: Self-pay

## 2020-10-10 ENCOUNTER — Ambulatory Visit: Payer: Medicare Other | Admitting: Podiatry

## 2020-10-10 DIAGNOSIS — Z794 Long term (current) use of insulin: Secondary | ICD-10-CM | POA: Diagnosis not present

## 2020-10-10 DIAGNOSIS — Z91199 Patient's noncompliance with other medical treatment and regimen due to unspecified reason: Secondary | ICD-10-CM | POA: Insufficient documentation

## 2020-10-10 DIAGNOSIS — G4734 Idiopathic sleep related nonobstructive alveolar hypoventilation: Secondary | ICD-10-CM | POA: Insufficient documentation

## 2020-10-10 DIAGNOSIS — L84 Corns and callosities: Secondary | ICD-10-CM

## 2020-10-10 DIAGNOSIS — E119 Type 2 diabetes mellitus without complications: Secondary | ICD-10-CM | POA: Diagnosis not present

## 2020-10-10 DIAGNOSIS — Z9119 Patient's noncompliance with other medical treatment and regimen: Secondary | ICD-10-CM | POA: Insufficient documentation

## 2020-10-14 DIAGNOSIS — U071 COVID-19: Secondary | ICD-10-CM | POA: Diagnosis not present

## 2020-10-14 NOTE — Progress Notes (Signed)
Subjective: [name] is a pleasant 70 y.o. female patient seen today for preventative diabetic foot care and callus(es) right foot. Aggravating factors include weightbearing with and without shoe gear. Pain is relieved with periodic professional debridement.   Patient states their blood glucose was 109 mg/dl on yesterday.  Patient states she got a pedicure recently and does not want her nails trimmed today.  PCP is [name], DO. Last visit was: 07/12/2020.  No Known Allergies  Objective: Physical Exam  General: [name] is a pleasant 70 y.o. African American female, in NAD. AAO x 3.   Vascular:  Capillary refill time to digits immediate b/l. Palpable pedal pulses b/l LE. Pedal hair absent. Lower extremity skin temperature gradient within normal limits. No pain with calf compression b/l. No edema noted b/l lower extremities.  Dermatological:  Pedal skin with normal turgor, texture and tone b/l lower extremities. Toenails 1-5 bilaterally well maintained with adequate length. No erythema, no edema, no drainage, no fluctuance. Hyperkeratotic lesion(s) submet head 1 right foot.  No erythema, no edema, no drainage, no fluctuance.  Musculoskeletal:  Normal muscle strength 5/5 to all lower extremity muscle groups bilaterally. Hammertoe(s) noted to the 1-5 bilaterally.  Neurological:  Protective sensation intact 5/5 intact bilaterally with 10g monofilament b/l. Vibratory sensation intact b/l.  Assessment and Plan:  1. Callus   2. Type 2 diabetes mellitus without complication, with long-term current use of insulin ([place])      -Examined patient. -Continue diabetic foot care principles: inspect feet daily, monitor glucose as recommended by PCP and/or Endocrinologist, and follow prescribed diet per PCP, Endocrinologist and/or dietician. -Patient to continue soft, supportive shoe gear daily. -Callus(es) submet head 1 right foot pared utilizing sterile scalpel blade without  complication or incident. Total number debrided =1. -Patient to report any pedal injuries to medical professional immediately. -Patient/POA to call should there be question/concern in the interim.  Return in about 3 months (around 01/10/2021).  Marzetta Board, DPM

## 2020-11-08 DIAGNOSIS — U071 COVID-19: Secondary | ICD-10-CM | POA: Diagnosis not present

## 2020-11-10 DIAGNOSIS — E039 Hypothyroidism, unspecified: Secondary | ICD-10-CM | POA: Diagnosis not present

## 2020-11-10 DIAGNOSIS — D6851 Activated protein C resistance: Secondary | ICD-10-CM | POA: Diagnosis not present

## 2020-11-10 DIAGNOSIS — E1122 Type 2 diabetes mellitus with diabetic chronic kidney disease: Secondary | ICD-10-CM | POA: Diagnosis not present

## 2020-11-10 DIAGNOSIS — E1140 Type 2 diabetes mellitus with diabetic neuropathy, unspecified: Secondary | ICD-10-CM | POA: Diagnosis not present

## 2020-11-10 DIAGNOSIS — E1165 Type 2 diabetes mellitus with hyperglycemia: Secondary | ICD-10-CM | POA: Diagnosis not present

## 2020-11-10 DIAGNOSIS — E559 Vitamin D deficiency, unspecified: Secondary | ICD-10-CM | POA: Diagnosis not present

## 2020-11-10 DIAGNOSIS — N1831 Chronic kidney disease, stage 3a: Secondary | ICD-10-CM | POA: Diagnosis not present

## 2020-11-10 DIAGNOSIS — I25118 Atherosclerotic heart disease of native coronary artery with other forms of angina pectoris: Secondary | ICD-10-CM | POA: Diagnosis not present

## 2020-11-10 DIAGNOSIS — Z79899 Other long term (current) drug therapy: Secondary | ICD-10-CM | POA: Diagnosis not present

## 2020-11-10 DIAGNOSIS — D6869 Other thrombophilia: Secondary | ICD-10-CM | POA: Diagnosis not present

## 2020-11-10 DIAGNOSIS — I130 Hypertensive heart and chronic kidney disease with heart failure and stage 1 through stage 4 chronic kidney disease, or unspecified chronic kidney disease: Secondary | ICD-10-CM | POA: Diagnosis not present

## 2020-11-14 DIAGNOSIS — U071 COVID-19: Secondary | ICD-10-CM | POA: Diagnosis not present

## 2021-01-13 ENCOUNTER — Other Ambulatory Visit: Payer: Self-pay | Admitting: Internal Medicine

## 2021-01-17 ENCOUNTER — Other Ambulatory Visit: Payer: Self-pay

## 2021-01-17 ENCOUNTER — Ambulatory Visit: Payer: Medicare Other | Admitting: Podiatry

## 2021-01-17 ENCOUNTER — Encounter: Payer: Self-pay | Admitting: Podiatry

## 2021-01-17 DIAGNOSIS — B351 Tinea unguium: Secondary | ICD-10-CM | POA: Diagnosis not present

## 2021-01-17 DIAGNOSIS — Z794 Long term (current) use of insulin: Secondary | ICD-10-CM | POA: Diagnosis not present

## 2021-01-17 DIAGNOSIS — E1165 Type 2 diabetes mellitus with hyperglycemia: Secondary | ICD-10-CM

## 2021-01-17 DIAGNOSIS — M79675 Pain in left toe(s): Secondary | ICD-10-CM | POA: Diagnosis not present

## 2021-01-17 DIAGNOSIS — M79674 Pain in right toe(s): Secondary | ICD-10-CM | POA: Diagnosis not present

## 2021-01-17 DIAGNOSIS — L84 Corns and callosities: Secondary | ICD-10-CM

## 2021-01-18 NOTE — Progress Notes (Signed)
  Subjective:  Patient ID: [name], female    DOB: 03-Dec-1950,  MRN: [id number]  [name] presents to clinic today for at risk foot care with h/o coagulation defect and NIDDM with chronic kidney disease, and callus(es) bilaterally and painful thick toenails that are difficult to trim. Painful toenails interfere with ambulation. Aggravating factors include wearing enclosed shoe gear. Pain is relieved with periodic professional debridement. Painful calluses are aggravated when weightbearing with and without shoegear. Pain is relieved with periodic professional debridement.  Patient is not required to monitor blood glucose daily.  PCP is [name], DO , and last visit was 07/12/2020.  No Known Allergies  Review of Systems: Negative except as noted in the HPI. Objective:   [name] is a pleasant 70 y.o. African American female, WD, WN in NAD. AAO x 3.   Vascular CFT immediate b/l LE. Palpable DP/PT pulses b/l LE. Digital hair absent b/l. Skin temperature gradient WNL b/l. No pain with calf compression b/l. No edema noted b/l. No cyanosis or clubbing noted b/l LE.  Neurologic Normal speech. Oriented to person, place, and time. Protective sensation intact 5/5 intact bilaterally with 10g monofilament b/l. Vibratory sensation intact b/l.  Dermatologic Pedal integument with normal turgor, texture and tone b/l LE. No open wounds b/l. No interdigital macerations b/l. Toenails 1-5 b/l elongated, thickened, discolored with subungual debris. +Tenderness with dorsal palpation of nailplates. Hyperkeratotic lesion(s) noted submet head 1 b/l and submet head 5 b/l.  Orthopedic: Normal muscle strength 5/5 to all lower extremity muscle groups bilaterally. Hammertoe deformity noted 1-5 b/l.Marland Kitchen No pain, crepitus or joint limitation noted with ROM b/l LE.  Patient ambulates independently without assistive aids.   Radiographs: None  Last A1c:  Hemoglobin A1C Latest Ref Rng &  Units 06/23/2020 02/03/2020  HGBA1C 4.8 - 5.6 % 13.0(H) 11.6(H)  Some recent data might be hidden   Assessment:   1. Pain due to onychomycosis of toenails of both feet   2. Callus   3. Type 2 diabetes mellitus with hyperglycemia, with long-term current use of insulin ([place])    Plan:  Patient was evaluated and treated and all questions answered. Consent given for treatment as described below: -Continue foot and shoe inspections daily. Monitor blood glucose per PCP/Endocrinologist's recommendations. -Patient to continue soft, supportive shoe gear daily. -Mycotic toenails 1-5 bilaterally were debrided in length and girth with sterile nail nippers and dremel without incident. -Callus(es) submet head 1 b/l and submet head 5 b/l pared utilizing sterile scalpel blade without complication or incident. Total number debrided =4. -Patient/POA to call should there be question/concern in the interim.  Return in about 3 months (around 04/17/2021).  Marzetta Board, DPM

## 2021-01-30 ENCOUNTER — Other Ambulatory Visit: Payer: Self-pay | Admitting: Internal Medicine

## 2021-03-06 ENCOUNTER — Other Ambulatory Visit: Payer: Self-pay | Admitting: Student

## 2021-03-06 DIAGNOSIS — E2839 Other primary ovarian failure: Secondary | ICD-10-CM

## 2021-03-10 DIAGNOSIS — U071 COVID-19: Secondary | ICD-10-CM | POA: Diagnosis not present

## 2021-03-13 DIAGNOSIS — Z794 Long term (current) use of insulin: Secondary | ICD-10-CM | POA: Diagnosis not present

## 2021-03-13 DIAGNOSIS — E1122 Type 2 diabetes mellitus with diabetic chronic kidney disease: Secondary | ICD-10-CM | POA: Diagnosis not present

## 2021-03-16 DIAGNOSIS — U071 COVID-19: Secondary | ICD-10-CM | POA: Diagnosis not present

## 2021-04-10 DIAGNOSIS — U071 COVID-19: Secondary | ICD-10-CM | POA: Diagnosis not present

## 2021-04-16 DIAGNOSIS — U071 COVID-19: Secondary | ICD-10-CM | POA: Diagnosis not present

## 2021-05-01 ENCOUNTER — Other Ambulatory Visit: Payer: Self-pay

## 2021-05-01 ENCOUNTER — Encounter: Payer: Self-pay | Admitting: Podiatry

## 2021-05-01 ENCOUNTER — Ambulatory Visit (INDEPENDENT_AMBULATORY_CARE_PROVIDER_SITE_OTHER): Payer: Medicare Other | Admitting: Podiatry

## 2021-05-01 DIAGNOSIS — L84 Corns and callosities: Secondary | ICD-10-CM | POA: Diagnosis not present

## 2021-05-01 DIAGNOSIS — M2041 Other hammer toe(s) (acquired), right foot: Secondary | ICD-10-CM | POA: Diagnosis not present

## 2021-05-01 DIAGNOSIS — B351 Tinea unguium: Secondary | ICD-10-CM | POA: Diagnosis not present

## 2021-05-01 DIAGNOSIS — Z794 Long term (current) use of insulin: Secondary | ICD-10-CM | POA: Diagnosis not present

## 2021-05-01 DIAGNOSIS — E119 Type 2 diabetes mellitus without complications: Secondary | ICD-10-CM | POA: Diagnosis not present

## 2021-05-01 DIAGNOSIS — M79674 Pain in right toe(s): Secondary | ICD-10-CM | POA: Diagnosis not present

## 2021-05-01 DIAGNOSIS — M2170 Unequal limb length (acquired), unspecified site: Secondary | ICD-10-CM

## 2021-05-01 DIAGNOSIS — M2042 Other hammer toe(s) (acquired), left foot: Secondary | ICD-10-CM | POA: Diagnosis not present

## 2021-05-01 DIAGNOSIS — M79675 Pain in left toe(s): Secondary | ICD-10-CM | POA: Diagnosis not present

## 2021-05-07 NOTE — Progress Notes (Signed)
ANNUAL DIABETIC FOOT EXAM ? ?Subjective: ?[name] presents today for annual diabetic foot examination. ? ?Patient relates 8 year h/o diabetes. ? ?Patient denies any h/o foot wounds. ?  ?Patient admits symptoms of foot tingling on bottom of feet on occasion. ? ?Patient denies any numbness, burning, or pins/needle sensation in feet. ? ?Patient's blood sugar was 156 mg/dl today. States last HgA1c was 8.1%. ? ?Risk factors:  clotting disorder, h/o DVT, diabetes, h/o MI, HTN, CAD, CKD, hyperlipidemia. ? ?Cipriano Mile, NP is patient's PCP. Last visit was November 10, 2020. ? ?Past Medical History:  ?Diagnosis Date  ? Clotting disorder ([place])   ? Constipation   ? history of  ? Diabetes mellitus without complication ([place])   ? DVT (deep venous thrombosis) ([place])   ? 2007  ? Factor V Leiden ([place])   ? deficiency   ? GERD (gastroesophageal reflux disease)   ? Hypertension   ? Hypothyroidism   ? Kidney disease   ? early stage  ? NSTEMI (non-ST elevated myocardial infarction) ([place])   ? 2011 - possibly r/t DVT/embolization?   ? Sleep apnea   ? no c-pap  ? ?Patient Active Problem List  ? Diagnosis Date Noted  ? Idiopathic sleep related nonobstructive alveolar hypoventilation 10/10/2020  ? Noncompliance with treatment 10/10/2020  ? Cyst of right breast 09/20/2020  ? Acute non-ST segment elevation myocardial infarction [facility]) 09/19/2020  ? Deep venous thrombosis ([place]) 09/19/2020  ? Factor V Leiden mutation ([place]) 09/19/2020  ? Gastroesophageal reflux disease 09/19/2020  ? Sleep apnea 09/19/2020  ? Activated protein C resistance ([place]) 03/24/2020  ? Coronary artery disease 03/24/2020  ? Diabetic nephropathy ([place]) 03/24/2020  ? Hyperlipidemia 03/24/2020  ? Long term (current) use of insulin ([place]) 03/24/2020  ? Old myocardial infarction 03/24/2020  ? Pneumonia due to COVID-19 virus   ? DKA (diabetic ketoacidosis) ([place]) 02/02/2020  ? Chest pain 01/06/2018  ? CKD (chronic kidney disease), stage III ([place]) 01/06/2018  ? History of DVT  (deep vein thrombosis) 07/18/2017  ? Right leg pain 07/18/2017  ? Swelling of both lower extremities 07/18/2017  ? Dyslipidemia 07/18/2017  ? Insulin-requiring or dependent type II diabetes mellitus ([place]) 07/01/2016  ? Hx of non-ST elevation myocardial infarction (NSTEMI) 07/01/2016  ? Screening for lipid disorders 07/01/2016  ? Diabetes mellitus ([place]) 06/22/2015  ? COUGH 11/27/2007  ? PHLEBITIS, SUPERFICIAL VEINS, UPPER LIMB 09/29/2006  ? VENOUS INSUFFICIENCY, CHRONIC, RIGHT LEG 07/30/2006  ? Hypothyroidism 04/17/2006  ? Essential hypertension 04/17/2006  ? [place], LEG 04/17/2006  ? ?Past Surgical History:  ?Procedure Laterality Date  ? CARDIAC CATHETERIZATION  10/21/2009  ? mild bridging(?) segment in mid LAD, otherwise normal coronaries (Dr. Alma Friendly)  ? COLONOSCOPY    ? INGUINAL HERNIA REPAIR    ? when 40 months old  ? LOWER EXTREMITY ARTERIAL DOPPLER  2011  ? normal LEA  ? SLEEP STUDY  2011  ? AHI 8.5 and REM AHI 18  ? TRANSTHORACIC ECHOCARDIOGRAM  2011  ? EF 55-60%, trivial MR, TR, pulm valve regurg  ? TUBAL LIGATION  1995  ? UPPER GASTROINTESTINAL ENDOSCOPY    ? ?Current Outpatient Medications on File Prior to Visit  ?Medication Sig Dispense Refill  ? acetaminophen (TYLENOL) 500 MG tablet Take 1,000 mg by mouth every 6 (six) hours as needed for mild pain.    ? albuterol (VENTOLIN HFA) 108 (90 Base) MCG/ACT inhaler Inhale 2 puffs into the lungs every 6 (six) hours as needed for wheezing or shortness  of breath. 8 g 0  ? apixaban (ELIQUIS) 2.5 MG TABS tablet Take 1 tablet (2.5 mg total) by mouth 2 (two) times daily. 60 tablet 0  ? Ascorbic Acid (VITAMIN C) 500 MG CAPS     ? Ascorbic Acid (VITAMIN C) 500 MG CAPS See admin instructions.    ? ascorbic acid (VITAMIN C) 500 MG tablet ascorbic acid (vitamin C) 500 mg tablet ? TAKE 1 TABLET (500 MG TOTAL) BY MOUTH DAILY.    ? ASPIRIN 81 PO 1 tablet    ? aspirin EC 81 MG tablet Take 81 mg by mouth daily.    ? Blood Glucose Monitoring Suppl (ONETOUCH  VERIO) w/Device KIT     ? Blood Glucose Monitoring Suppl (ONETOUCH VERIO) w/Device KIT See admin instructions.    ? Calcium Carbonate+Vitamin D (CALCIUM 600+D3) 600-200 MG-UNIT TABS 1 tablet with a meal    ? Cholecalciferol 25 MCG (1000 UT) tablet Vitamin D3 25 mcg (1,000 unit) tablet ? TAKE 1 TABLET BY MOUTH EVERY DAY    ? clotrimazole (GYNE-LOTRIMIN) 1 % vaginal cream clotrimazole 1 % vaginal cream ? INSERT 1 APPLICATORFUL VAGINALLY EVERY DAY FOR 7 DAYS    ? ezetimibe-simvastatin (VYTORIN) 10-40 MG tablet Take 1 tablet by mouth daily. PATIENT MUST SCHEDULE APPOINTMENT FOR FUTURE REFILLS. FIRST ATTEMPT 30 tablet 0  ? ezetimibe-simvastatin (VYTORIN) 10-40 MG tablet 1 tablet    ? fluconazole (DIFLUCAN) 150 MG tablet fluconazole 150 mg tablet ? TAKE ONE TABLET BY ORAL ROUTE IMMEDIATELT MAY REPEAT IN 3 DAYS IF NEEDED    ? HYDROcodone bit-homatropine (HYCODAN) 5-1.5 MG/5ML syrup hydrocodone-homatropine 5 mg-1.5 mg/5 mL oral syrup    ? insulin glargine (LANTUS SOLOSTAR) 100 UNIT/ML Solostar Pen 20 units    ? Insulin lispro (HUMALOG JUNIOR KWIKPEN) 100 UNIT/ML KwikPen Junior 12 units+ correctional    ? insulin lispro (HUMALOG) 100 UNIT/ML injection Inject 5-10 Units into the skin 3 (three) times daily with meals.     ? levothyroxine (SYNTHROID) 112 MCG tablet 2 tablet in the morning on an empty stomach    ? levothyroxine (SYNTHROID, LEVOTHROID) 200 MCG tablet Take 200 mcg by mouth daily before breakfast.     ? losartan (COZAAR) 100 MG tablet Take 1 tablet (100 mg total) by mouth daily. 90 tablet 0  ? losartan (COZAAR) 100 MG tablet Take 1 tablet by mouth daily.    ? metFORMIN (GLUCOPHAGE) 1000 MG tablet Take 1 tablet by mouth 2 (two) times daily with a meal.    ? metFORMIN (GLUCOPHAGE-XR) 500 MG 24 hr tablet Take 1,000 mg by mouth 2 (two) times daily.    ? metoprolol succinate (TOPROL-XL) 25 MG 24 hr tablet Take 1 tablet by mouth daily.    ? Mouthwash Compounding Base LIQD Swish and spit 15 mLs every 6 (six) hours as  needed. Maalox 80 mL; nystatin 100,000 units/ML  80 mL; viscous lidocaine 2%- 22m 240 mL 0  ? Multiple Vitamin (MULTI VITAMIN) TABS 1 tablet    ? naproxen (NAPROSYN) 500 MG tablet naproxen 500 mg tablet    ? nystatin (MYCOSTATIN) 100000 UNIT/ML suspension Take 5 mLs (500,000 Units total) by mouth 4 (four) times daily. 60 mL 0  ? ondansetron (ZOFRAN-ODT) 8 MG disintegrating tablet Take 8 mg by mouth 3 (three) times daily.    ? ONETOUCH ULTRA test strip     ? pantoprazole (PROTONIX) 40 MG tablet Take 1 tablet (40 mg total) by mouth daily at 6 (six) AM. 30 tablet 0  ? pantoprazole (  PROTONIX) 40 MG tablet Take 1 tablet by mouth daily.    ? polyethylene glycol powder (GLYCOLAX/MIRALAX) 17 GM/SCOOP powder polyethylene glycol 3350 17 gram/dose oral powder ? TAKE 17GRAMS (1 CAPFUL) EVERY DAY MIXED WITH 8 OZ. WATER, JUICE, SODA, COFFEE OR TEA    ? predniSONE (DELTASONE) 10 MG tablet Take 1 tablet (10 mg total) by mouth daily. Take 30 mg daily x2 days, then, ?Take 20 mg daily x2 days, then, ?Take 10 mg daily x2 days, then, ?Take 5 mg daily x2 days, then, stop. (Patient not taking: Reported on 06/23/2020) 20 tablet 0  ? Vitamin D, Ergocalciferol, (DRISDOL) 1.25 MG (50000 UNIT) CAPS capsule     ? Zinc 25 MG TABS 1 tablet    ? zinc sulfate 220 (50 Zn) MG capsule zinc sulfate 50 mg zinc (220 mg) capsule ? TAKE 1 CAPSULE BY MOUTH DAILY.    ? zolpidem (AMBIEN) 5 MG tablet 1 tablet at bedtime as needed    ? ?No current facility-administered medications on file prior to visit.  ?  ?No Known Allergies ?Social History  ? ?Occupational History  ? Occupation: police department  ?  Employer: [employer]  ?Tobacco Use  ? Smoking status: Never  ? Smokeless tobacco: Never  ?Substance and Sexual Activity  ? Alcohol use: Yes  ?  Alcohol/week: 0.0 standard drinks  ?  Comment: wine  ? Drug use: No  ? Sexual activity: Not on file  ? ?Family History  ?Problem Relation Age of Onset  ? Lung cancer Father   ? Cancer Father   ? Diabetes Father    ? Other Mother   ?     childbirth - enlarged heart  ? Cancer Paternal Grandmother   ? Diabetes Paternal Grandmother   ? Mental illness Paternal Grandmother   ? Hypertension Paternal Grandfather   ? Colon c

## 2021-05-08 DIAGNOSIS — U071 COVID-19: Secondary | ICD-10-CM | POA: Diagnosis not present

## 2021-05-14 DIAGNOSIS — U071 COVID-19: Secondary | ICD-10-CM | POA: Diagnosis not present

## 2021-05-23 ENCOUNTER — Encounter (HOSPITAL_BASED_OUTPATIENT_CLINIC_OR_DEPARTMENT_OTHER): Payer: Self-pay | Admitting: Obstetrics and Gynecology

## 2021-05-23 ENCOUNTER — Other Ambulatory Visit: Payer: Self-pay

## 2021-05-23 ENCOUNTER — Emergency Department (HOSPITAL_BASED_OUTPATIENT_CLINIC_OR_DEPARTMENT_OTHER)
Admission: EM | Admit: 2021-05-23 | Discharge: 2021-05-23 | Disposition: A | Discharge status: Home / Self Care | Payer: Medicare Other | Attending: Emergency Medicine | Admitting: Emergency Medicine

## 2021-05-23 DIAGNOSIS — Z7901 Long term (current) use of anticoagulants: Secondary | ICD-10-CM | POA: Insufficient documentation

## 2021-05-23 DIAGNOSIS — W25XXXA Contact with sharp glass, initial encounter: Secondary | ICD-10-CM | POA: Insufficient documentation

## 2021-05-23 DIAGNOSIS — Z23 Encounter for immunization: Secondary | ICD-10-CM | POA: Diagnosis not present

## 2021-05-23 DIAGNOSIS — Z7982 Long term (current) use of aspirin: Secondary | ICD-10-CM | POA: Diagnosis not present

## 2021-05-23 DIAGNOSIS — E119 Type 2 diabetes mellitus without complications: Secondary | ICD-10-CM | POA: Diagnosis not present

## 2021-05-23 DIAGNOSIS — Z794 Long term (current) use of insulin: Secondary | ICD-10-CM | POA: Diagnosis not present

## 2021-05-23 DIAGNOSIS — S91331A Puncture wound without foreign body, right foot, initial encounter: Secondary | ICD-10-CM | POA: Insufficient documentation

## 2021-05-23 DIAGNOSIS — Z7984 Long term (current) use of oral hypoglycemic drugs: Secondary | ICD-10-CM | POA: Diagnosis not present

## 2021-05-23 DIAGNOSIS — S99921A Unspecified injury of right foot, initial encounter: Secondary | ICD-10-CM | POA: Diagnosis present

## 2021-05-23 MED ORDER — ACETAMINOPHEN 500 MG PO TABS
1000.0000 mg | ORAL_TABLET | Freq: Once | ORAL | Status: AC
  Administered 2021-05-23: 1000 mg via ORAL
  Filled 2021-05-23: qty 2

## 2021-05-23 MED ORDER — CEPHALEXIN 500 MG PO CAPS: 500.0000 mg | ORAL_CAPSULE | Freq: Three times a day (TID) | ORAL | 0 refills | Status: AC

## 2021-05-23 MED ORDER — LIDOCAINE-EPINEPHRINE 1 %-1:100000 IJ SOLN
10.0000 mL | Freq: Once | INTRAMUSCULAR | Status: AC
  Administered 2021-05-23: 10 mL via INTRADERMAL
  Filled 2021-05-23: qty 1

## 2021-05-23 MED ORDER — TETANUS-DIPHTH-ACELL PERTUSSIS 5-2.5-18.5 LF-MCG/0.5 IM SUSY
0.5000 mL | PREFILLED_SYRINGE | Freq: Once | INTRAMUSCULAR | Status: AC
  Administered 2021-05-23: 0.5 mL via INTRAMUSCULAR
  Filled 2021-05-23: qty 0.5

## 2021-05-23 NOTE — Discharge Instructions (Signed)
Soak with soap and water twice daily for 5 days.  Use antibiotics as prescribed.  Use Tylenol every 4 hours as needed for pain.  Watch for signs of infection. ?Please follow-up with your doctor to improve glucose management. ?

## 2021-05-23 NOTE — ED Triage Notes (Signed)
Patient reports to the ER for right sided foot pain and reports she is diabetic. Patient reports whatever she stepped on broke the skin and she feels the pain is swollen.  ?

## 2021-05-23 NOTE — ED Provider Notes (Signed)
?[facility] EMERGENCY DEPT ?Provider Note ? ? ?CSN: [id number] ?Arrival date & time: 05/23/21  3329 ? ?  ? ?History ? ?Chief Complaint  ?Patient presents with  ? Foot Injury  ? ? ?[name] is a 71 y.o. female. ? ?Patient presents with right foot pain since stepping on something, unknown object but thinks a piece of plastic.  Unsure tetanus.  Feels mild swelling.  No fevers or redness.  Patient diabetic glucose has been around 300 lately.  Patient is primary doctor. ? ? ?  ? ?Home Medications ?Prior to Admission medications   ?Medication Sig Start Date End Date Taking? Authorizing Provider  ?acetaminophen (TYLENOL) 500 MG tablet Take 1,000 mg by mouth every 6 (six) hours as needed for mild pain.    [provider]  ?albuterol (VENTOLIN HFA) 108 (90 Base) MCG/ACT inhaler Inhale 2 puffs into the lungs every 6 (six) hours as needed for wheezing or shortness of breath. 02/08/20   Kayleen Memos, DO  ?apixaban (ELIQUIS) 2.5 MG TABS tablet Take 1 tablet (2.5 mg total) by mouth 2 (two) times daily. 02/08/20   Kayleen Memos, DO  ?Ascorbic Acid (VITAMIN C) 500 MG CAPS     [provider]  ?Ascorbic Acid (VITAMIN C) 500 MG CAPS See admin instructions.    [provider]  ?ascorbic acid (VITAMIN C) 500 MG tablet ascorbic acid (vitamin C) 500 mg tablet ? TAKE 1 TABLET (500 MG TOTAL) BY MOUTH DAILY.    [provider]  ?ASPIRIN 81 PO 1 tablet    [provider]  ?aspirin EC 81 MG tablet Take 81 mg by mouth daily.    [provider]  ?Blood Glucose Monitoring Suppl (ONETOUCH VERIO) w/Device KIT  02/14/20   [provider]  ?Blood Glucose Monitoring Suppl (ONETOUCH VERIO) w/Device KIT See admin instructions. 02/14/20   [provider]  ?Calcium Carbonate+Vitamin D (CALCIUM 600+D3) 600-200 MG-UNIT TABS 1 tablet with a meal    [provider]  ?Cholecalciferol 25 MCG (1000 UT) tablet Vitamin D3 25 mcg (1,000 unit) tablet ? TAKE 1  TABLET BY MOUTH EVERY DAY    [provider]  ?clotrimazole (GYNE-LOTRIMIN) 1 % vaginal cream clotrimazole 1 % vaginal cream ? INSERT 1 APPLICATORFUL VAGINALLY EVERY DAY FOR 7 DAYS    [provider]  ?ezetimibe-simvastatin (VYTORIN) 10-40 MG tablet Take 1 tablet by mouth daily. PATIENT MUST SCHEDULE APPOINTMENT FOR FUTURE REFILLS. FIRST ATTEMPT 01/15/21   Pixie Casino, MD  ?ezetimibe-simvastatin (VYTORIN) 10-40 MG tablet 1 tablet    [provider]  ?fluconazole (DIFLUCAN) 150 MG tablet fluconazole 150 mg tablet ? TAKE ONE TABLET BY ORAL ROUTE IMMEDIATELT MAY REPEAT IN 3 DAYS IF NEEDED    [provider]  ?HYDROcodone bit-homatropine (HYCODAN) 5-1.5 MG/5ML syrup hydrocodone-homatropine 5 mg-1.5 mg/5 mL oral syrup    [provider]  ?insulin glargine (LANTUS SOLOSTAR) 100 UNIT/ML Solostar Pen 20 units    [provider]  ?Insulin lispro (HUMALOG JUNIOR KWIKPEN) 100 UNIT/ML KwikPen Junior 12 units+ correctional    [provider]  ?insulin lispro (HUMALOG) 100 UNIT/ML injection Inject 5-10 Units into the skin 3 (three) times daily with meals.     [provider]  ?levothyroxine (SYNTHROID) 112 MCG tablet 2 tablet in the morning on an empty stomach    [provider]  ?levothyroxine (SYNTHROID, LEVOTHROID) 200 MCG tablet Take 200 mcg by mouth daily before breakfast.     [provider]  ?  losartan (COZAAR) 100 MG tablet Take 1 tablet (100 mg total) by mouth daily. 02/09/20 05/09/20  Kayleen Memos, DO  ?losartan (COZAAR) 100 MG tablet Take 1 tablet by mouth daily.    [provider]  ?metFORMIN (GLUCOPHAGE) 1000 MG tablet Take 1 tablet by mouth 2 (two) times daily with a meal.    [provider]  ?metFORMIN (GLUCOPHAGE-XR) 500 MG 24 hr tablet Take 1,000 mg by mouth 2 (two) times daily.    [provider]  ?metoprolol succinate (TOPROL-XL) 25 MG 24 hr tablet Take 1 tablet by mouth daily.    [provider]  ?Mouthwash Compounding Base LIQD Swish and spit 15 mLs every 6 (six) hours as needed. Maalox 80 mL; nystatin 100,000 units/ML  80 mL; viscous lidocaine 2%- 16m 06/23/20   Lamptey, PMyrene Galas MD  ?Multiple Vitamin (MULTI VITAMIN) TABS 1 tablet    [provider]  ?naproxen (NAPROSYN) 500 MG tablet naproxen 500 mg tablet    [provider]  ?nystatin (MYCOSTATIN) 100000 UNIT/ML suspension Take 5 mLs (500,000 Units total) by mouth 4 (four) times daily. 08/06/20   HLuna Fuse MD  ?ondansetron (ZOFRAN-ODT) 8 MG disintegrating tablet Take 8 mg by mouth 3 (three) times daily. 01/27/20   [provider]  ?ODonald Sivatest strip  01/25/20   [provider]  ?pantoprazole (PROTONIX) 40 MG tablet Take 1 tablet (40 mg total) by mouth daily at 6 (six) AM. 01/08/18   Regalado, Belkys A, MD  ?pantoprazole (PROTONIX) 40 MG tablet Take 1 tablet by mouth daily.    [provider]  ?polyethylene glycol powder (GLYCOLAX/MIRALAX) 17 GM/SCOOP powder polyethylene glycol 3350 17 gram/dose oral powder ? TAKE 17GRAMS (1 CAPFUL) EVERY DAY MIXED WITH 8 OZ. WATER, JUICE, SODA, COFFEE OR TEA    [provider]  ?predniSONE (DELTASONE) 10 MG tablet Take 1 tablet (10 mg total) by mouth daily. Take 30 mg daily x2 days, then, ?Take 20 mg daily x2 days, then, ?Take 10 mg daily x2 days, then, ?Take 5 mg daily x2 days, then, stop. ?Patient not taking: Reported on 06/23/2020 02/08/20   HKayleen Memos DO  ?Vitamin D, Ergocalciferol, (DRISDOL) 1.25 MG (50000 UNIT) CAPS capsule  03/16/20   [provider]  ?Zinc 25 MG TABS 1 tablet    [provider]  ?zinc sulfate 220 (50 Zn) MG capsule zinc sulfate 50 mg zinc (220 mg) capsule ? TAKE 1 CAPSULE BY MOUTH DAILY.    [provider]  ?zolpidem (AMBIEN) 5 MG tablet 1 tablet at bedtime as needed    [provider]  ?   ? ?Allergies    ?Patient has no known allergies.   ? ?Review of Systems   ?Review of  Systems  ?Constitutional:  Negative for chills and fever.  ?HENT:  Negative for congestion.   ?Eyes:  Negative for visual disturbance.  ?Respiratory:  Negative for shortness of breath.   ?Cardiovascular:  Negative for chest pain.  ?Gastrointestinal:  Negative for abdominal pain and vomiting.  ?Genitourinary:  Negative for dysuria and flank pain.  ?Musculoskeletal:  Positive for gait problem. Negative for back pain, neck pain and neck stiffness.  ?Skin:  Negative for rash.  ?Neurological:  Negative for light-headedness, numbness and headaches.  ? ?Physical Exam ?Updated Vital Signs ?BP (!) 162/77   Pulse 73   Temp 98.2 ?F (36.8 ?C)   Resp 15   Ht _0  (1.626 m)   Wt 82.1 kg  SpO2 100%   BMI 31.07 kg/m?  ?Physical Exam ?Vitals and nursing note reviewed.  ?Constitutional:   ?   General: She is not in acute distress. ?   Appearance: She is well-developed.  ?HENT:  ?   Head: Normocephalic and atraumatic.  ?   Mouth/Throat:  ?   Mouth: Mucous membranes are moist.  ?Eyes:  ?   General:     ?   Right eye: No discharge.     ?   Left eye: No discharge.  ?   Conjunctiva/sclera: Conjunctivae normal.  ?Neck:  ?   Trachea: No tracheal deviation.  ?Cardiovascular:  ?   Rate and Rhythm: Normal rate.  ?Pulmonary:  ?   Effort: Pulmonary effort is normal.  ?Abdominal:  ?   General: There is no distension.  ?   Palpations: Abdomen is soft.  ?Musculoskeletal:     ?   General: Tenderness present. No swelling.  ?   Cervical back: Normal range of motion and neck supple.  ?Skin: ?   General: Skin is warm.  ?   Capillary Refill: Capillary refill takes less than 2 seconds.  ?   Findings: No rash.  ?   Comments: 2 mm puncture wound no active bleeding mid plantar surface right foot, no bleeding or signs of infection, mild tender to palpation. No ulcers  ?Neurological:  ?   General: No focal deficit present.  ?   Mental Status: She is alert.  ?Psychiatric:     ?   Mood and Affect: Mood normal.  ? ? ?ED Results / Procedures / Treatments    ?Labs ?(all labs ordered are listed, but only abnormal results are displayed) ?Labs Reviewed - No data to display ? ?EKG ?None ? ?Radiology ?No results found. ? ?Procedures ?Marland KitchenForeign Body Removal ? ?Date/Time: 4/5

## 2021-05-28 ENCOUNTER — Ambulatory Visit: Payer: Medicare Other | Admitting: Podiatry

## 2021-05-28 ENCOUNTER — Encounter: Payer: Self-pay | Admitting: Podiatry

## 2021-05-28 ENCOUNTER — Ambulatory Visit (INDEPENDENT_AMBULATORY_CARE_PROVIDER_SITE_OTHER): Payer: Medicare Other

## 2021-05-28 DIAGNOSIS — S9031XA Contusion of right foot, initial encounter: Secondary | ICD-10-CM

## 2021-05-28 DIAGNOSIS — L923 Foreign body granuloma of the skin and subcutaneous tissue: Secondary | ICD-10-CM

## 2021-05-29 NOTE — Progress Notes (Signed)
Subjective:  ? ?Patient ID: [name], female   DOB: 71 y.o.   MRN: [id number]  ? ?HPI ?Patient presents stating she stepped on something on the bottom of her right foot and thinks it may be a piece of glass and she is a diabetic ? ? ?ROS ? ? ?   ?Objective:  ?Physical Exam  ?Neurovascular status intact good sharp dull vibratory noted and digital perfusion.  Plantar aspect right foot there is a small opening where a incision was made measuring about 5 mm by the emergency room and it has closed and she still feels like there is something in there ? ?   ?Assessment:  ?Possibility for unknown foreign body plantar aspect right first metatarsal ? ?   ?Plan:  ?Anesthetized the right forefoot with 120 mg like Marcaine mixture sterile prep of the area and using sterile blade I did open the area up to approximate 8 mm.  I then probed and I was able to find a small piece of glass that was located in this area causing her pathology and I removed it in toto.  Inspected for further abnormal tissue did not note flush the wound applied sterile dressing instructed on soaks and elevation of her foot and she will be seen back if any issues were to occur.  Should heal uneventfully in the next few days and very pleased we are able to get the foreign body out ? ?X-rays were negative that this was not metallic or an indication that there was anything of that nature ?   ? ? ?

## 2021-06-08 DIAGNOSIS — U071 COVID-19: Secondary | ICD-10-CM | POA: Diagnosis not present

## 2021-06-14 DIAGNOSIS — U071 COVID-19: Secondary | ICD-10-CM | POA: Diagnosis not present

## 2021-06-22 ENCOUNTER — Telehealth: Payer: Self-pay | Admitting: Podiatry

## 2021-06-27 ENCOUNTER — Ambulatory Visit: Payer: Medicare Other | Admitting: Podiatry

## 2021-06-27 DIAGNOSIS — L923 Foreign body granuloma of the skin and subcutaneous tissue: Secondary | ICD-10-CM | POA: Diagnosis not present

## 2021-06-27 DIAGNOSIS — M779 Enthesopathy, unspecified: Secondary | ICD-10-CM

## 2021-06-27 MED ORDER — DICLOFENAC SODIUM 75 MG PO TBEC: 75.0000 mg | DELAYED_RELEASE_TABLET | Freq: Two times a day (BID) | ORAL | 2 refills | Status: DC

## 2021-06-27 NOTE — Progress Notes (Signed)
Subjective:  ? ?Patient ID: [name], female   DOB: 71 y.o.   MRN: [id number]  ? ?HPI ?Patient presents stating that the piece of glass removal is done well but she is getting pain in the outside of her lower leg and foot and was just wanted to have that checked ? ? ?ROS ? ? ?   ?Objective:  ?Physical Exam  ?Neurovascular status intact with the incision site right plantar foot doing very well and healed well with wound edges well coapted.  Patient has mild discomfort outside right foot negative Bevelyn Buckles' sign was noted ? ?   ?Assessment:  ?Probability that this is a compensation from having had the injury causing her to walk on the outside and straining the muscle group ? ?   ?Plan:  ?H&P explained this to her at this point I recommend mended anti-inflammatories and placed her on diclofenac twice daily with instructions compress therapy.  Reappoint if symptoms persist ?   ? ? ?

## 2021-06-27 NOTE — Telephone Encounter (Signed)
Patient called back she does not want to take the diclofenac (VOLTAREN) 75 MG EC tablet [[id number]]  she has a factor five condition.  She would be more comfortable taking the ibuprofen . Please advise

## 2021-06-27 NOTE — Telephone Encounter (Signed)
Please advise 

## 2021-06-28 ENCOUNTER — Other Ambulatory Visit: Payer: Self-pay | Admitting: Podiatry

## 2021-06-28 MED ORDER — IBUPROFEN 600 MG PO TABS: 600.0000 mg | ORAL_TABLET | Freq: Three times a day (TID) | ORAL | 0 refills | Status: DC | PRN

## 2021-06-28 NOTE — Telephone Encounter (Signed)
done

## 2021-06-28 NOTE — Telephone Encounter (Signed)
Spoke with patient and she is asking for an ibuprofen prescription, said that it was discussed during visit. Please advise/send to pharmacy on file.

## 2021-06-28 NOTE — Telephone Encounter (Signed)
That is fine 

## 2021-06-29 NOTE — Telephone Encounter (Signed)
Called patient, no answer and could not leave a vmessage, mailbox full.

## 2021-07-08 DIAGNOSIS — U071 COVID-19: Secondary | ICD-10-CM | POA: Diagnosis not present

## 2021-07-21 ENCOUNTER — Emergency Department (HOSPITAL_COMMUNITY): Payer: Medicare Other

## 2021-07-21 ENCOUNTER — Emergency Department (HOSPITAL_COMMUNITY)
Admission: EM | Admit: 2021-07-21 | Discharge: 2021-07-22 | Disposition: A | Discharge status: Home / Self Care | Payer: Medicare Other | Attending: Emergency Medicine | Admitting: Emergency Medicine

## 2021-07-21 ENCOUNTER — Other Ambulatory Visit: Payer: Self-pay

## 2021-07-21 ENCOUNTER — Encounter (HOSPITAL_COMMUNITY): Payer: Self-pay

## 2021-07-21 DIAGNOSIS — I129 Hypertensive chronic kidney disease with stage 1 through stage 4 chronic kidney disease, or unspecified chronic kidney disease: Secondary | ICD-10-CM | POA: Diagnosis not present

## 2021-07-21 DIAGNOSIS — R072 Precordial pain: Secondary | ICD-10-CM | POA: Diagnosis present

## 2021-07-21 DIAGNOSIS — Z794 Long term (current) use of insulin: Secondary | ICD-10-CM | POA: Insufficient documentation

## 2021-07-21 DIAGNOSIS — E876 Hypokalemia: Secondary | ICD-10-CM | POA: Insufficient documentation

## 2021-07-21 DIAGNOSIS — E162 Hypoglycemia, unspecified: Secondary | ICD-10-CM | POA: Insufficient documentation

## 2021-07-21 DIAGNOSIS — M79604 Pain in right leg: Secondary | ICD-10-CM | POA: Insufficient documentation

## 2021-07-21 DIAGNOSIS — R059 Cough, unspecified: Secondary | ICD-10-CM | POA: Diagnosis not present

## 2021-07-21 DIAGNOSIS — Z79899 Other long term (current) drug therapy: Secondary | ICD-10-CM | POA: Diagnosis not present

## 2021-07-21 DIAGNOSIS — E1122 Type 2 diabetes mellitus with diabetic chronic kidney disease: Secondary | ICD-10-CM | POA: Diagnosis not present

## 2021-07-21 DIAGNOSIS — R519 Headache, unspecified: Secondary | ICD-10-CM | POA: Diagnosis not present

## 2021-07-21 DIAGNOSIS — Z7982 Long term (current) use of aspirin: Secondary | ICD-10-CM | POA: Diagnosis not present

## 2021-07-21 DIAGNOSIS — M79605 Pain in left leg: Secondary | ICD-10-CM | POA: Diagnosis not present

## 2021-07-21 DIAGNOSIS — G8929 Other chronic pain: Secondary | ICD-10-CM | POA: Diagnosis not present

## 2021-07-21 DIAGNOSIS — N189 Chronic kidney disease, unspecified: Secondary | ICD-10-CM | POA: Insufficient documentation

## 2021-07-21 DIAGNOSIS — Z20822 Contact with and (suspected) exposure to covid-19: Secondary | ICD-10-CM | POA: Insufficient documentation

## 2021-07-21 DIAGNOSIS — J3489 Other specified disorders of nose and nasal sinuses: Secondary | ICD-10-CM | POA: Insufficient documentation

## 2021-07-21 MED ORDER — METOCLOPRAMIDE HCL 5 MG/ML IJ SOLN
10.0000 mg | Freq: Once | INTRAMUSCULAR | Status: AC
Start: 2021-07-21 — End: 2021-07-21
  Administered 2021-07-21: 10 mg via INTRAVENOUS
  Filled 2021-07-21: qty 2

## 2021-07-21 NOTE — ED Provider Notes (Signed)
[facility] DEPT Provider Note   CSN: [id number] Arrival date & time: 07/21/21  2239     History  Chief Complaint  Patient presents with   Chest Pain    [name] is a 71 y.o. female.  The history is provided by the patient and medical records.  Chest Pain [name] is a 71 y.o. female who presents to the Emergency Department complaining of chest pain and headache.  She reports chest pain described as indigestion that started this morning.  Also described tight and pressure that comes and goes.  No clear alleviating or worsening factors.  Has runny nose starting today.    No diaphoresis, sob.  Has cough - started today.  No abdominal pain, N/V.  Has chronic leg cramps - unchanged from baseline.   Also complains of two weeks of headache - frontal. Comes and goes.  No vision changes, numbness/weakness.  Has fatigue.    Has a hx/o DM, htn, CKD.    Takes metoprolol, ASA, simvistatin, humalog, lantus.  Reports diabetes poorly controlled.    No tobacco, occ alcohol.  No drugs.    Sometimes does not take meds but has been better over the last month.  Has a lot of stress.      Home Medications Prior to Admission medications   Medication Sig Start Date End Date Taking? Authorizing Provider  acetaminophen (TYLENOL) 500 MG tablet Take 1,000 mg by mouth every 6 (six) hours as needed for mild pain.   Yes [provider]  ascorbic acid (VITAMIN C) 500 MG tablet Take 500 mg by mouth daily.   Yes [provider]  ASPIRIN 81 PO Take 1 tablet by mouth daily as needed (pain).   Yes [provider]  clotrimazole (GYNE-LOTRIMIN) 1 % vaginal cream Place 1 Applicatorful vaginally at bedtime.   Yes [provider]  diclofenac (VOLTAREN) 75 MG EC tablet Take 1 tablet (75 mg total) by mouth 2 (two) times daily. 06/27/21  Yes Regal, Tamala Fothergill, DPM  ezetimibe-simvastatin (VYTORIN) 10-40 MG tablet Take 1 tablet by mouth daily.  PATIENT MUST SCHEDULE APPOINTMENT FOR FUTURE REFILLS. FIRST ATTEMPT 01/15/21  Yes Hilty, Nadean Corwin, MD  insulin lispro (HUMALOG) 100 UNIT/ML injection Inject 5-10 Units into the skin 3 (three) times daily with meals.   Yes [provider]  levothyroxine (SYNTHROID, LEVOTHROID) 200 MCG tablet Take 200 mcg by mouth daily before breakfast.    Yes [provider]  losartan (COZAAR) 100 MG tablet Take 1 tablet (100 mg total) by mouth daily. 02/09/20 07/22/21 Yes Kayleen Memos, DO  metoprolol succinate (TOPROL-XL) 25 MG 24 hr tablet Take 1 tablet by mouth daily.   Yes [provider]  pantoprazole (PROTONIX) 40 MG tablet Take 1 tablet (40 mg total) by mouth daily at 6 (six) AM. 01/08/18  Yes Regalado, Belkys A, MD  albuterol (VENTOLIN HFA) 108 (90 Base) MCG/ACT inhaler Inhale 2 puffs into the lungs every 6 (six) hours as needed for wheezing or shortness of breath. Patient not taking: Reported on 07/21/2021 02/08/20   Kayleen Memos, DO  Blood Glucose Monitoring Suppl (ONETOUCH VERIO) w/Device KIT See admin instructions. 02/14/20   [provider]  ibuprofen (ADVIL) 600 MG tablet Take 1 tablet (600 mg total) by mouth every 8 (eight) hours as needed. Patient not taking: Reported on 07/22/2021 06/28/21   Wallene Huh, DPM  Mouthwash Compounding Base LIQD Swish and spit 15 mLs every 6 (six) hours as  needed. Maalox 80 mL; nystatin 100,000 units/ML  80 mL; viscous lidocaine 2%- 55ml Patient not taking: Reported on 07/22/2021 06/23/20   Chase Picket, MD  nystatin (MYCOSTATIN) 100000 UNIT/ML suspension Take 5 mLs (500,000 Units total) by mouth 4 (four) times daily. Patient not taking: Reported on 07/22/2021 08/06/20   Luna Fuse, MD  [facility] ULTRA test strip  01/25/20   [provider]  predniSONE (DELTASONE) 10 MG tablet Take 1 tablet (10 mg total) by mouth daily. Take 30 mg daily x2 days, then, Take 20 mg daily x2 days, then, Take 10 mg daily x2 days, then, Take 5  mg daily x2 days, then, stop. Patient not taking: Reported on 06/23/2020 02/08/20   Kayleen Memos, DO      Allergies    Patient has no known allergies.    Review of Systems   Review of Systems  Cardiovascular:  Positive for chest pain.  All other systems reviewed and are negative.  Physical Exam Updated Vital Signs BP 124/75   Pulse 63   Temp 98.1 F (36.7 C) (Oral)   Resp 16   Ht $R'5\' 4"'xd$  (1.626 m)   Wt 80.7 kg   SpO2 100%   BMI 30.55 kg/m  Physical Exam Vitals and nursing note reviewed.  Constitutional:      Appearance: She is well-developed.  HENT:     Head: Normocephalic and atraumatic.  Cardiovascular:     Rate and Rhythm: Normal rate and regular rhythm.     Heart sounds: No murmur heard. Pulmonary:     Effort: Pulmonary effort is normal. No respiratory distress.     Breath sounds: Normal breath sounds.  Abdominal:     Palpations: Abdomen is soft.     Tenderness: There is no abdominal tenderness. There is no guarding or rebound.  Musculoskeletal:        General: No swelling or tenderness.     Cervical back: Neck supple.  Skin:    General: Skin is warm and dry.  Neurological:     Mental Status: She is alert and oriented to person, place, and time.     Comments: 5 out of 5 strength in all 4 extremities with sensation to light touch intact in all 4 extremities  Psychiatric:        Behavior: Behavior normal.    ED Results / Procedures / Treatments   Labs (all labs ordered are listed, but only abnormal results are displayed) Labs Reviewed  BASIC METABOLIC PANEL - Abnormal; Notable for the following components:      Result Value   Potassium 3.2 (*)    Glucose, Bld 39 (*)    BUN 28 (*)    Creatinine, Ser 1.18 (*)    GFR, Estimated 50 (*)    All other components within normal limits  CBG MONITORING, ED - Abnormal; Notable for the following components:   Glucose-Capillary 41 (*)    All other components within normal limits  CBG MONITORING, ED - Abnormal;  Notable for the following components:   Glucose-Capillary 66 (*)    All other components within normal limits  CBG MONITORING, ED - Abnormal; Notable for the following components:   Glucose-Capillary 238 (*)    All other components within normal limits  SARS CORONAVIRUS 2 BY RT PCR  CBC  TROPONIN I (HIGH SENSITIVITY)  TROPONIN I (HIGH SENSITIVITY)    EKG EKG Interpretation  Date/Time:  Saturday July 21 2021 22:47:05 EDT Ventricular Rate:  75 PR Interval:  149 QRS Duration: 95 QT Interval:  400 QTC Calculation: 447 R Axis:   62 Text Interpretation: Sinus rhythm Confirmed by Quintella Reichert [phone]) on 07/21/2021 11:06:56 PM  Radiology DG Chest 2 View  Result Date: 07/21/2021 CLINICAL DATA:  chest pain EXAM: CHEST - 2 VIEW COMPARISON:  Chest x-ray 02/06/2020, CT chest 02/03/2020 FINDINGS: The heart and mediastinal contours are within normal limits. No focal consolidation. No pulmonary edema. No pleural effusion. No pneumothorax. No acute osseous abnormality. IMPRESSION: No active cardiopulmonary disease. Electronically Signed   By: Iven Finn M.D.   On: 07/21/2021 23:54    Procedures Procedures    Medications Ordered in ED Medications  metoCLOPramide (REGLAN) injection 10 mg (10 mg Intravenous Given 07/21/21 2345)  potassium chloride SA (KLOR-CON M) CR tablet 20 mEq (20 mEq Oral Given 07/22/21 2883)    ED Course/ Medical Decision Making/ A&P                           Medical Decision Making Amount and/or Complexity of Data Reviewed Labs: ordered. Radiology: ordered.  Risk Prescription drug management.   Patient with history of hypertension, diabetes here for evaluation of chest pain, headache.  EKG is without acute ischemic changes and troponins are negative x2.  Patient hypertensive on ED presentation, pressures improved without intervention.  In terms of her chest pain, current clinical picture is not consistent with ACS, hypertensive urgency, dissection, PE,  pneumonia.  Chest x-ray without acute abnormality, images personally interpreted.  Patient is stable for close PCP follow-up and return precautions regarding her chest pain.  Patient did have hypoglycemia during her ED stay.  She was more drowsy than her baseline during this episode.  This was corrected with oral intake.  She did take increased correction for hyperglycemia prior to ED arrival and did not have a large carb content given the dose she took.  Renal function is near her baseline.  In terms of headache, she is nontoxic-appearing with no neurologic deficits at this time.  Current clinical picture is not consistent with hypertensive urgency, CVA, subarachnoid hemorrhage or meningitis.  Discussed with patient option for CT head imaging, patient declines at this time.  Feel she is stable to follow-up with PCP regarding headache as well.  Discussed return precautions for new or concerning symptoms.        Final Clinical Impression(s) / ED Diagnoses Final diagnoses:  Precordial pain  Hypokalemia  Hypoglycemia    Rx / DC Orders ED Discharge Orders     None         Quintella Reichert, MD 07/22/21 [phone]

## 2021-07-21 NOTE — ED Triage Notes (Signed)
On Wednesday patient started having discomfort in her chest and could not sleep. Today the chest pain did not go away, she has had an indigestion feeling. Has high blood pressure and not taking her medication like she should be. Patient said she has had headaches for a couple of weeks and that she usually does not get headaches. Taking aspirin and metoprolol.

## 2021-07-22 LAB — TROPONIN I (HIGH SENSITIVITY)
Troponin I (High Sensitivity): 5 ng/L (ref <18)
Troponin I (High Sensitivity): 6 ng/L (ref <18)

## 2021-07-22 LAB — CBG MONITORING, ED
Glucose-Capillary: 41 mg/dL — CL (ref 70–99)
Glucose-Capillary: 66 mg/dL — ABNORMAL LOW (ref 70–99)
Glucose-Capillary: 238 mg/dL — ABNORMAL HIGH (ref 70–99)

## 2021-07-22 LAB — CBC
HCT: 37.5 % (ref 36.0–46.0)
Hemoglobin: 12.5 g/dL (ref 12.0–15.0)
MCH: 28.3 pg (ref 26.0–34.0)
MCHC: 33.3 g/dL (ref 30.0–36.0)
MCV: 85 fL (ref 80.0–100.0)
Platelets: 302 10*3/uL (ref 150–400)
RBC: 4.41 MIL/uL (ref 3.87–5.11)
RDW: 14.1 % (ref 11.5–15.5)
WBC: 7 10*3/uL (ref 4.0–10.5)
nRBC: 0 % (ref 0.0–0.2)

## 2021-07-22 LAB — BASIC METABOLIC PANEL
Anion gap: 7 (ref 5–15)
BUN: 28 mg/dL — ABNORMAL HIGH (ref 8–23)
CO2: 23 mmol/L (ref 22–32)
Calcium: 9.8 mg/dL (ref 8.9–10.3)
Chloride: 109 mmol/L (ref 98–111)
Creatinine, Ser: 1.18 mg/dL — ABNORMAL HIGH (ref 0.44–1.00)
GFR, Estimated: 50 mL/min — ABNORMAL LOW (ref >60)
Glucose, Bld: 39 mg/dL — CL (ref 70–99)
Potassium: 3.2 mmol/L — ABNORMAL LOW (ref 3.5–5.1)
Sodium: 139 mmol/L (ref 135–145)

## 2021-07-22 LAB — SARS CORONAVIRUS 2 BY RT PCR: SARS Coronavirus 2 by RT PCR: NEGATIVE

## 2021-07-22 MED ORDER — POTASSIUM CHLORIDE CRYS ER 20 MEQ PO TBCR
20.0000 meq | EXTENDED_RELEASE_TABLET | Freq: Once | ORAL | Status: AC
  Administered 2021-07-22: 20 meq via ORAL
  Filled 2021-07-22: qty 1

## 2021-07-22 NOTE — ED Notes (Signed)
Pt given orange juice due to low CBG

## 2021-08-04 ENCOUNTER — Emergency Department (HOSPITAL_COMMUNITY): Payer: Medicare Other

## 2021-08-04 ENCOUNTER — Inpatient Hospital Stay (HOSPITAL_COMMUNITY)
Admission: EM | Admit: 2021-08-04 | Discharge: 2021-08-07 | DRG: 871 | Disposition: A | Discharge status: Home / Self Care | Payer: Medicare Other | Attending: Internal Medicine | Admitting: Internal Medicine

## 2021-08-04 ENCOUNTER — Encounter (HOSPITAL_COMMUNITY): Payer: Self-pay | Admitting: Emergency Medicine

## 2021-08-04 ENCOUNTER — Observation Stay (HOSPITAL_COMMUNITY): Payer: Medicare Other

## 2021-08-04 DIAGNOSIS — Z79899 Other long term (current) drug therapy: Secondary | ICD-10-CM

## 2021-08-04 DIAGNOSIS — Z20822 Contact with and (suspected) exposure to covid-19: Secondary | ICD-10-CM | POA: Diagnosis present

## 2021-08-04 DIAGNOSIS — G9341 Metabolic encephalopathy: Secondary | ICD-10-CM | POA: Diagnosis not present

## 2021-08-04 DIAGNOSIS — Z91199 Patient's noncompliance with other medical treatment and regimen due to unspecified reason: Secondary | ICD-10-CM

## 2021-08-04 DIAGNOSIS — E876 Hypokalemia: Secondary | ICD-10-CM | POA: Diagnosis present

## 2021-08-04 DIAGNOSIS — Z801 Family history of malignant neoplasm of trachea, bronchus and lung: Secondary | ICD-10-CM

## 2021-08-04 DIAGNOSIS — D649 Anemia, unspecified: Secondary | ICD-10-CM | POA: Diagnosis not present

## 2021-08-04 DIAGNOSIS — Z7982 Long term (current) use of aspirin: Secondary | ICD-10-CM

## 2021-08-04 DIAGNOSIS — E669 Obesity, unspecified: Secondary | ICD-10-CM | POA: Diagnosis present

## 2021-08-04 DIAGNOSIS — E1110 Type 2 diabetes mellitus with ketoacidosis without coma: Secondary | ICD-10-CM | POA: Diagnosis not present

## 2021-08-04 DIAGNOSIS — K219 Gastro-esophageal reflux disease without esophagitis: Secondary | ICD-10-CM | POA: Diagnosis present

## 2021-08-04 DIAGNOSIS — Z833 Family history of diabetes mellitus: Secondary | ICD-10-CM

## 2021-08-04 DIAGNOSIS — I129 Hypertensive chronic kidney disease with stage 1 through stage 4 chronic kidney disease, or unspecified chronic kidney disease: Secondary | ICD-10-CM | POA: Diagnosis present

## 2021-08-04 DIAGNOSIS — N1831 Chronic kidney disease, stage 3a: Secondary | ICD-10-CM | POA: Diagnosis present

## 2021-08-04 DIAGNOSIS — N179 Acute kidney failure, unspecified: Secondary | ICD-10-CM | POA: Diagnosis present

## 2021-08-04 DIAGNOSIS — J690 Pneumonitis due to inhalation of food and vomit: Secondary | ICD-10-CM | POA: Diagnosis present

## 2021-08-04 DIAGNOSIS — Z794 Long term (current) use of insulin: Secondary | ICD-10-CM

## 2021-08-04 DIAGNOSIS — A419 Sepsis, unspecified organism: Secondary | ICD-10-CM | POA: Diagnosis not present

## 2021-08-04 DIAGNOSIS — I10 Essential (primary) hypertension: Secondary | ICD-10-CM | POA: Diagnosis present

## 2021-08-04 DIAGNOSIS — I252 Old myocardial infarction: Secondary | ICD-10-CM

## 2021-08-04 DIAGNOSIS — E872 Acidosis, unspecified: Secondary | ICD-10-CM | POA: Diagnosis present

## 2021-08-04 DIAGNOSIS — D6851 Activated protein C resistance: Secondary | ICD-10-CM | POA: Diagnosis present

## 2021-08-04 DIAGNOSIS — Z6830 Body mass index (BMI) 30.0-30.9, adult: Secondary | ICD-10-CM

## 2021-08-04 DIAGNOSIS — Z8249 Family history of ischemic heart disease and other diseases of the circulatory system: Secondary | ICD-10-CM

## 2021-08-04 DIAGNOSIS — D509 Iron deficiency anemia, unspecified: Secondary | ICD-10-CM | POA: Diagnosis present

## 2021-08-04 DIAGNOSIS — E039 Hypothyroidism, unspecified: Secondary | ICD-10-CM | POA: Diagnosis present

## 2021-08-04 DIAGNOSIS — E1122 Type 2 diabetes mellitus with diabetic chronic kidney disease: Secondary | ICD-10-CM | POA: Diagnosis present

## 2021-08-04 DIAGNOSIS — R6510 Systemic inflammatory response syndrome (SIRS) of non-infectious origin without acute organ dysfunction: Secondary | ICD-10-CM | POA: Diagnosis present

## 2021-08-04 DIAGNOSIS — E875 Hyperkalemia: Secondary | ICD-10-CM | POA: Diagnosis present

## 2021-08-04 DIAGNOSIS — E860 Dehydration: Secondary | ICD-10-CM | POA: Diagnosis present

## 2021-08-04 DIAGNOSIS — Z79890 Hormone replacement therapy (postmenopausal): Secondary | ICD-10-CM

## 2021-08-04 DIAGNOSIS — E11649 Type 2 diabetes mellitus with hypoglycemia without coma: Secondary | ICD-10-CM | POA: Diagnosis not present

## 2021-08-04 DIAGNOSIS — I2510 Atherosclerotic heart disease of native coronary artery without angina pectoris: Secondary | ICD-10-CM | POA: Diagnosis present

## 2021-08-04 LAB — COMPREHENSIVE METABOLIC PANEL
ALT: 20 U/L (ref 0–44)
AST: 47 U/L — ABNORMAL HIGH (ref 15–41)
Albumin: 3.4 g/dL — ABNORMAL LOW (ref 3.5–5.0)
Alkaline Phosphatase: 75 U/L (ref 38–126)
Anion gap: 13 (ref 5–15)
BUN: 17 mg/dL (ref 8–23)
CO2: 14 mmol/L — ABNORMAL LOW (ref 22–32)
Calcium: 9.3 mg/dL (ref 8.9–10.3)
Chloride: 102 mmol/L (ref 98–111)
Creatinine, Ser: 1.63 mg/dL — ABNORMAL HIGH (ref 0.44–1.00)
GFR, Estimated: 34 mL/min — ABNORMAL LOW (ref >60)
Glucose, Bld: 255 mg/dL — ABNORMAL HIGH (ref 70–99)
Potassium: 5.8 mmol/L — ABNORMAL HIGH (ref 3.5–5.1)
Sodium: 129 mmol/L — ABNORMAL LOW (ref 135–145)
Total Bilirubin: 1.6 mg/dL — ABNORMAL HIGH (ref 0.3–1.2)
Total Protein: 8 g/dL (ref 6.5–8.1)

## 2021-08-04 LAB — IRON AND TIBC
Iron: 22 ug/dL — ABNORMAL LOW (ref 28–170)
Saturation Ratios: 10 % — ABNORMAL LOW (ref 10.4–31.8)
TIBC: 220 ug/dL — ABNORMAL LOW (ref 250–450)
UIBC: 198 ug/dL

## 2021-08-04 LAB — URINALYSIS, ROUTINE W REFLEX MICROSCOPIC
Bilirubin Urine: NEGATIVE
Glucose, UA: >=500 mg/dL — AB
Ketones, ur: 80 mg/dL — AB
Leukocytes,Ua: NEGATIVE
Nitrite: NEGATIVE
Protein, ur: 100 mg/dL — AB
Specific Gravity, Urine: 1.011 (ref 1.005–1.030)
pH: 5 (ref 5.0–8.0)

## 2021-08-04 LAB — RETICULOCYTES
Immature Retic Fract: 12.2 % (ref 2.3–15.9)
RBC.: 3.78 MIL/uL — ABNORMAL LOW (ref 3.87–5.11)
Retic Count, Absolute: 27.6 10*3/uL (ref 19.0–186.0)
Retic Ct Pct: 0.7 % (ref 0.4–3.1)

## 2021-08-04 LAB — BASIC METABOLIC PANEL
Anion gap: 14 (ref 5–15)
Anion gap: 16 — ABNORMAL HIGH (ref 5–15)
Anion gap: 16 — ABNORMAL HIGH (ref 5–15)
BUN: 11 mg/dL (ref 8–23)
BUN: 15 mg/dL (ref 8–23)
BUN: 12 mg/dL (ref 8–23)
CO2: 9 mmol/L — ABNORMAL LOW (ref 22–32)
CO2: 13 mmol/L — ABNORMAL LOW (ref 22–32)
CO2: 13 mmol/L — ABNORMAL LOW (ref 22–32)
Calcium: 7.2 mg/dL — ABNORMAL LOW (ref 8.9–10.3)
Calcium: 8.6 mg/dL — ABNORMAL LOW (ref 8.9–10.3)
Calcium: 8.7 mg/dL — ABNORMAL LOW (ref 8.9–10.3)
Chloride: 108 mmol/L (ref 98–111)
Chloride: 102 mmol/L (ref 98–111)
Chloride: 103 mmol/L (ref 98–111)
Creatinine, Ser: 0.87 mg/dL (ref 0.44–1.00)
Creatinine, Ser: 1.28 mg/dL — ABNORMAL HIGH (ref 0.44–1.00)
Creatinine, Ser: 1.24 mg/dL — ABNORMAL HIGH (ref 0.44–1.00)
GFR, Estimated: >60 mL/min (ref >60)
GFR, Estimated: 45 mL/min — ABNORMAL LOW (ref >60)
GFR, Estimated: 47 mL/min — ABNORMAL LOW (ref >60)
Glucose, Bld: 174 mg/dL — ABNORMAL HIGH (ref 70–99)
Glucose, Bld: 327 mg/dL — ABNORMAL HIGH (ref 70–99)
Glucose, Bld: 327 mg/dL — ABNORMAL HIGH (ref 70–99)
Potassium: 3.6 mmol/L (ref 3.5–5.1)
Potassium: 4.1 mmol/L (ref 3.5–5.1)
Potassium: 4 mmol/L (ref 3.5–5.1)
Sodium: 131 mmol/L — ABNORMAL LOW (ref 135–145)
Sodium: 131 mmol/L — ABNORMAL LOW (ref 135–145)
Sodium: 132 mmol/L — ABNORMAL LOW (ref 135–145)

## 2021-08-04 LAB — RESP PANEL BY RT-PCR (FLU A&B, COVID) ARPGX2
Influenza A by PCR: NEGATIVE
Influenza B by PCR: NEGATIVE
SARS Coronavirus 2 by RT PCR: NEGATIVE

## 2021-08-04 LAB — CBC WITH DIFFERENTIAL/PLATELET
Abs Immature Granulocytes: 0.05 10*3/uL (ref 0.00–0.07)
Abs Immature Granulocytes: 0.07 10*3/uL (ref 0.00–0.07)
Basophils Absolute: 0 10*3/uL (ref 0.0–0.1)
Basophils Absolute: 0 10*3/uL (ref 0.0–0.1)
Basophils Relative: 0 %
Basophils Relative: 0 %
Eosinophils Absolute: 0 10*3/uL (ref 0.0–0.5)
Eosinophils Absolute: 0 10*3/uL (ref 0.0–0.5)
Eosinophils Relative: 0 %
Eosinophils Relative: 0 %
HCT: 23.8 % — ABNORMAL LOW (ref 36.0–46.0)
HCT: 32.8 % — ABNORMAL LOW (ref 36.0–46.0)
Hemoglobin: 8.1 g/dL — ABNORMAL LOW (ref 12.0–15.0)
Hemoglobin: 11.2 g/dL — ABNORMAL LOW (ref 12.0–15.0)
Immature Granulocytes: 1 %
Immature Granulocytes: 1 %
Lymphocytes Relative: 11 %
Lymphocytes Relative: 11 %
Lymphs Abs: 0.8 10*3/uL (ref 0.7–4.0)
Lymphs Abs: 0.9 10*3/uL (ref 0.7–4.0)
MCH: 28.5 pg (ref 26.0–34.0)
MCH: 28.2 pg (ref 26.0–34.0)
MCHC: 34 g/dL (ref 30.0–36.0)
MCHC: 34.1 g/dL (ref 30.0–36.0)
MCV: 83.8 fL (ref 80.0–100.0)
MCV: 82.6 fL (ref 80.0–100.0)
Monocytes Absolute: 1 10*3/uL (ref 0.1–1.0)
Monocytes Absolute: 0.8 10*3/uL (ref 0.1–1.0)
Monocytes Relative: 14 %
Monocytes Relative: 9 %
Neutro Abs: 5.2 10*3/uL (ref 1.7–7.7)
Neutro Abs: 7.2 10*3/uL (ref 1.7–7.7)
Neutrophils Relative %: 74 %
Neutrophils Relative %: 79 %
Platelets: 203 10*3/uL (ref 150–400)
Platelets: 218 10*3/uL (ref 150–400)
RBC: 2.84 MIL/uL — ABNORMAL LOW (ref 3.87–5.11)
RBC: 3.97 MIL/uL (ref 3.87–5.11)
RDW: 14.6 % (ref 11.5–15.5)
RDW: 14.4 % (ref 11.5–15.5)
WBC: 7 10*3/uL (ref 4.0–10.5)
WBC: 9 10*3/uL (ref 4.0–10.5)
nRBC: 0 % (ref 0.0–0.2)
nRBC: 0 % (ref 0.0–0.2)

## 2021-08-04 LAB — BLOOD GAS, VENOUS
Acid-base deficit: 16.5 mmol/L — ABNORMAL HIGH (ref 0.0–2.0)
Bicarbonate: 9 mmol/L — ABNORMAL LOW (ref 20.0–28.0)
O2 Saturation: 91.5 %
Patient temperature: 37
pCO2, Ven: 20 mmHg — ABNORMAL LOW (ref 44–60)
pH, Ven: 7.26 (ref 7.25–7.43)
pO2, Ven: 64 mmHg — ABNORMAL HIGH (ref 32–45)

## 2021-08-04 LAB — I-STAT CHEM 8, ED
BUN: 10 mg/dL (ref 8–23)
Calcium, Ion: 0.97 mmol/L — ABNORMAL LOW (ref 1.15–1.40)
Chloride: 103 mmol/L (ref 98–111)
Creatinine, Ser: 0.8 mg/dL (ref 0.44–1.00)
Glucose, Bld: 190 mg/dL — ABNORMAL HIGH (ref 70–99)
HCT: 26 % — ABNORMAL LOW (ref 36.0–46.0)
Hemoglobin: 8.8 g/dL — ABNORMAL LOW (ref 12.0–15.0)
Potassium: 3.7 mmol/L (ref 3.5–5.1)
Sodium: 131 mmol/L — ABNORMAL LOW (ref 135–145)
TCO2: 11 mmol/L — ABNORMAL LOW (ref 22–32)

## 2021-08-04 LAB — CBG MONITORING, ED
Glucose-Capillary: 281 mg/dL — ABNORMAL HIGH (ref 70–99)
Glucose-Capillary: 308 mg/dL — ABNORMAL HIGH (ref 70–99)
Glucose-Capillary: 352 mg/dL — ABNORMAL HIGH (ref 70–99)
Glucose-Capillary: 306 mg/dL — ABNORMAL HIGH (ref 70–99)
Glucose-Capillary: 307 mg/dL — ABNORMAL HIGH (ref 70–99)
Glucose-Capillary: 203 mg/dL — ABNORMAL HIGH (ref 70–99)
Glucose-Capillary: 111 mg/dL — ABNORMAL HIGH (ref 70–99)

## 2021-08-04 LAB — LACTATE DEHYDROGENASE: LDH: 190 U/L (ref 98–192)

## 2021-08-04 LAB — LACTIC ACID, PLASMA
Lactic Acid, Venous: 4.3 mmol/L (ref 0.5–1.9)
Lactic Acid, Venous: 6.9 mmol/L (ref 0.5–1.9)
Lactic Acid, Venous: 1.2 mmol/L (ref 0.5–1.9)

## 2021-08-04 LAB — PROCALCITONIN: Procalcitonin: 0.43 ng/mL

## 2021-08-04 LAB — PROTIME-INR
INR: 0.9 (ref 0.8–1.2)
Prothrombin Time: 12 seconds (ref 11.4–15.2)

## 2021-08-04 LAB — FOLATE: Folate: 15.9 ng/mL (ref >5.9)

## 2021-08-04 LAB — FERRITIN: Ferritin: 575 ng/mL — ABNORMAL HIGH (ref 11–307)

## 2021-08-04 LAB — BETA-HYDROXYBUTYRIC ACID
Beta-Hydroxybutyric Acid: 1.78 mmol/L — ABNORMAL HIGH (ref 0.05–0.27)
Beta-Hydroxybutyric Acid: 2.34 mmol/L — ABNORMAL HIGH (ref 0.05–0.27)

## 2021-08-04 LAB — APTT: aPTT: <20 seconds — ABNORMAL LOW (ref 24–36)

## 2021-08-04 LAB — POC OCCULT BLOOD, ED: Fecal Occult Bld: NEGATIVE

## 2021-08-04 LAB — VITAMIN B12: Vitamin B-12: 478 pg/mL (ref 180–914)

## 2021-08-04 MED ORDER — INSULIN REGULAR(HUMAN) IN NACL 100-0.9 UT/100ML-% IV SOLN: INTRAVENOUS | Status: DC

## 2021-08-04 MED ORDER — INSULIN ASPART 100 UNIT/ML IJ SOLN
2.0000 [IU] | Freq: Once | INTRAMUSCULAR | Status: AC
  Administered 2021-08-04: 2 [IU] via SUBCUTANEOUS
  Filled 2021-08-04: qty 0.02

## 2021-08-04 MED ORDER — LACTATED RINGERS IV SOLN: INTRAVENOUS | Status: DC

## 2021-08-04 MED ORDER — PANTOPRAZOLE 80MG IVPB - SIMPLE MED
80.0000 mg | Freq: Once | INTRAVENOUS | Status: AC
Start: 2021-08-04 — End: 2021-08-04
  Administered 2021-08-04: 80 mg via INTRAVENOUS
  Filled 2021-08-04: qty 80

## 2021-08-04 MED ORDER — VANCOMYCIN HCL IN DEXTROSE 1-5 GM/200ML-% IV SOLN
1000.0000 mg | Freq: Once | INTRAVENOUS | Status: AC
  Administered 2021-08-04: 1000 mg via INTRAVENOUS
  Filled 2021-08-04: qty 200

## 2021-08-04 MED ORDER — ACETAMINOPHEN 325 MG PO TABS
650.0000 mg | ORAL_TABLET | Freq: Four times a day (QID) | ORAL | Status: DC | PRN
  Administered 2021-08-05 (×3): 650 mg via ORAL
  Filled 2021-08-04 (×3): qty 2

## 2021-08-04 MED ORDER — ACETAMINOPHEN 650 MG RE SUPP: 650.0000 mg | Freq: Four times a day (QID) | RECTAL | Status: DC | PRN

## 2021-08-04 MED ORDER — IOHEXOL 300 MG/ML  SOLN
100.0000 mL | Freq: Once | INTRAMUSCULAR | Status: AC | PRN
  Administered 2021-08-04: 100 mL via INTRAVENOUS

## 2021-08-04 MED ORDER — SODIUM CHLORIDE 0.9 % IV SOLN: 2.0000 g | Freq: Once | INTRAVENOUS | Status: DC

## 2021-08-04 MED ORDER — DEXTROSE IN LACTATED RINGERS 5 % IV SOLN: INTRAVENOUS | Status: DC

## 2021-08-04 MED ORDER — ACETAMINOPHEN 325 MG PO TABS
650.0000 mg | ORAL_TABLET | Freq: Once | ORAL | Status: AC
  Administered 2021-08-04: 650 mg via ORAL
  Filled 2021-08-04: qty 2

## 2021-08-04 MED ORDER — VANCOMYCIN HCL 1500 MG/300ML IV SOLN: 1500.0000 mg | INTRAVENOUS | Status: DC

## 2021-08-04 MED ORDER — DEXTROSE 50 % IV SOLN: 0.0000 mL | INTRAVENOUS | Status: DC | PRN

## 2021-08-04 MED ORDER — METRONIDAZOLE 500 MG/100ML IV SOLN
500.0000 mg | Freq: Two times a day (BID) | INTRAVENOUS | Status: DC
  Administered 2021-08-05: 500 mg via INTRAVENOUS
  Filled 2021-08-04: qty 100

## 2021-08-04 MED ORDER — LACTATED RINGERS IV BOLUS (SEPSIS)
1000.0000 mL | Freq: Once | INTRAVENOUS | Status: AC
  Administered 2021-08-04: 1000 mL via INTRAVENOUS

## 2021-08-04 MED ORDER — ONDANSETRON HCL 4 MG PO TABS: 4.0000 mg | ORAL_TABLET | Freq: Four times a day (QID) | ORAL | Status: DC | PRN

## 2021-08-04 MED ORDER — LEVOTHYROXINE SODIUM 100 MCG PO TABS
200.0000 ug | ORAL_TABLET | Freq: Every day | ORAL | Status: DC
  Administered 2021-08-05 – 2021-08-07 (×3): 200 ug via ORAL
  Filled 2021-08-04 (×3): qty 2

## 2021-08-04 MED ORDER — SODIUM CHLORIDE 0.9% FLUSH
3.0000 mL | Freq: Two times a day (BID) | INTRAVENOUS | Status: DC
  Administered 2021-08-05 – 2021-08-07 (×6): 3 mL via INTRAVENOUS

## 2021-08-04 MED ORDER — METRONIDAZOLE 500 MG/100ML IV SOLN
500.0000 mg | Freq: Once | INTRAVENOUS | Status: AC
  Administered 2021-08-04: 500 mg via INTRAVENOUS
  Filled 2021-08-04: qty 100

## 2021-08-04 MED ORDER — ONDANSETRON HCL 4 MG/2ML IJ SOLN: 4.0000 mg | Freq: Four times a day (QID) | INTRAMUSCULAR | Status: DC | PRN

## 2021-08-04 MED ORDER — ALBUTEROL SULFATE (2.5 MG/3ML) 0.083% IN NEBU: 2.5000 mg | INHALATION_SOLUTION | RESPIRATORY_TRACT | Status: DC | PRN

## 2021-08-04 MED ORDER — SODIUM CHLORIDE 0.9 % IV SOLN
2.0000 g | Freq: Once | INTRAVENOUS | Status: AC
  Administered 2021-08-04: 2 g via INTRAVENOUS
  Filled 2021-08-04: qty 12.5

## 2021-08-04 MED ORDER — ACETAMINOPHEN 500 MG PO TABS
1000.0000 mg | ORAL_TABLET | Freq: Once | ORAL | Status: AC
  Administered 2021-08-04: 1000 mg via ORAL
  Filled 2021-08-04: qty 2

## 2021-08-04 MED ORDER — METOPROLOL SUCCINATE ER 50 MG PO TB24
25.0000 mg | ORAL_TABLET | Freq: Every day | ORAL | Status: DC
  Administered 2021-08-04: 25 mg via ORAL
  Filled 2021-08-04: qty 1

## 2021-08-04 MED ORDER — POLYETHYLENE GLYCOL 3350 17 G PO PACK
17.0000 g | PACK | Freq: Every day | ORAL | Status: DC | PRN
  Administered 2021-08-06: 17 g via ORAL
  Filled 2021-08-04: qty 1

## 2021-08-04 MED ORDER — VANCOMYCIN HCL IN DEXTROSE 1-5 GM/200ML-% IV SOLN: 1000.0000 mg | Freq: Once | INTRAVENOUS | Status: DC

## 2021-08-04 MED ORDER — SODIUM CHLORIDE 0.9 % IV SOLN
2.0000 g | Freq: Two times a day (BID) | INTRAVENOUS | Status: DC
  Administered 2021-08-05: 2 g via INTRAVENOUS
  Filled 2021-08-04: qty 12.5

## 2021-08-04 MED ORDER — HYDRALAZINE HCL 20 MG/ML IJ SOLN
10.0000 mg | Freq: Four times a day (QID) | INTRAMUSCULAR | Status: DC | PRN
  Administered 2021-08-05 (×2): 10 mg via INTRAVENOUS
  Filled 2021-08-04 (×2): qty 1

## 2021-08-04 MED ORDER — PANTOPRAZOLE SODIUM 40 MG IV SOLR
40.0000 mg | Freq: Two times a day (BID) | INTRAVENOUS | Status: DC
  Administered 2021-08-05 – 2021-08-07 (×5): 40 mg via INTRAVENOUS
  Filled 2021-08-04 (×5): qty 10

## 2021-08-04 MED ORDER — INSULIN REGULAR(HUMAN) IN NACL 100-0.9 UT/100ML-% IV SOLN
INTRAVENOUS | Status: DC
  Administered 2021-08-04: 11.5 [IU]/h via INTRAVENOUS

## 2021-08-04 MED ORDER — LACTATED RINGERS IV BOLUS (SEPSIS)
500.0000 mL | Freq: Once | INTRAVENOUS | Status: AC
  Administered 2021-08-04: 500 mL via INTRAVENOUS

## 2021-08-04 MED ORDER — INSULIN REGULAR(HUMAN) IN NACL 100-0.9 UT/100ML-% IV SOLN
INTRAVENOUS | Status: DC
  Filled 2021-08-04: qty 100

## 2021-08-04 MED ORDER — POTASSIUM CHLORIDE 10 MEQ/100ML IV SOLN: 10.0000 meq | INTRAVENOUS | Status: AC

## 2021-08-04 NOTE — H&P (Signed)
History and Physical    Patient: [name] MRN: [id number] DOA: 08/04/2021  Date of Service: the patient was seen and examined on 08/04/2021  Patient coming from: Home  Chief Complaint:  Chief Complaint  Patient presents with   Hyperglycemia   Fever    HPI:   71 year old female with past medical history of factor V Leiden (not on anticoagulation) with prior DVT (in 2007), NSTEMI (2011 with Cath revealing virtually no CAD),  insulin-dependent diabetes mellitus type 2 (HgbA1C 13% 06/2020), hypertension, hypothyroidism, obstructive sleep apnea   (not on CPAP), gastroesophageal reflux disease presenting to [facility] long hospital emergency department with her son due to progressively worsening weakness and severe hyperglycemia.  Patient is a somewhat poor historian due to lethargy in the emergency department.  Majority the history has been obtained by discussion with the emergency department staff, review of notes and information provided by the son.  According to the son, the patient has been experiencing extremely high blood sugar over the past several weeks with blood sugars as high as 4-500.  He also reports that she has not been taking her Lantus over the same span of time.  Patient states that she stopped taking it because "it was not doing anything."  Patient's hyperglycemia continued to persist over the past several weeks and over the past several days the patient seem to have begun exhibiting generalized weakness.  Since this morning, the son reports that patient was extremely lethargic.  Upon further questioning son denies any recent significant shortness of breath, cough, fevers, sick contacts, recent travel, diarrhea, abdominal pain or dysuria.  Due to patient's progressively worsening lethargy and weakness he brought her into [facility] emergency department for evaluation.  Upon evaluation in the emergency department patient was found to be febrile at 21 F with  additional SIRS criteria including tachycardia and tachypnea.  Patient was found to be hyperglycemic with an anion gap and elevated beta hydroxybutyric acid all concerning for diabetic ketoacidosis.  Patient was also found to be anemic with hemoglobin of 8.1, down from 12.5 approximately 2 weeks ago.  Stool Hemoccult negative.  Patient has been initiated on broad-spectrum intravenous antibiotics with cefepime vancomycin and Flagyl due to concerns for underlying sepsis.  Patient is also received 2500 cc of lactated Ringer solution.  Hospitalist group has been called due to diabetic ketoacidosis with concerns for underlying sepsis and encephalopathy.  Review of Systems: Review of Systems  Constitutional:  Positive for malaise/fatigue.  Respiratory:  Positive for cough.   Neurological:  Positive for weakness.  All other systems reviewed and are negative.    Past Medical History:  Diagnosis Date   Clotting disorder ([place])    Constipation    history of   Diabetes mellitus without complication ([place])    DVT (deep venous thrombosis) ([place])    2007   Factor V Leiden ([place])    deficiency    GERD (gastroesophageal reflux disease)    Hypertension    Hypothyroidism    Kidney disease    early stage   NSTEMI (non-ST elevated myocardial infarction) ([place])    2011 - possibly r/t DVT/embolization?    Sleep apnea    no c-pap    Past Surgical History:  Procedure Laterality Date   CARDIAC CATHETERIZATION  10/21/2009   mild bridging(?) segment in mid LAD, otherwise normal coronaries (Dr. Alma Friendly)   [place]     when 6 months old   LOWER  EXTREMITY ARTERIAL DOPPLER  2011   normal LEA   SLEEP STUDY  2011   AHI 8.5 and REM AHI 18   TRANSTHORACIC ECHOCARDIOGRAM  2011   EF 55-60%, trivial MR, TR, pulm valve regurg   TUBAL LIGATION  1995   UPPER GASTROINTESTINAL ENDOSCOPY      Social History:  reports that she has never smoked. She has never used smokeless tobacco. She  reports current alcohol use. She reports that she does not use drugs.  No Known Allergies  Family History  Problem Relation Age of Onset   Lung cancer Father    Cancer Father    Diabetes Father    Other Mother        childbirth - enlarged heart   Cancer Paternal Grandmother    Diabetes Paternal Grandmother    Mental illness Paternal Grandmother    Hypertension Paternal Grandfather    Colon cancer Neg Hx     Prior to Admission medications   Medication Sig Start Date End Date Taking? Authorizing Provider  acetaminophen (TYLENOL) 500 MG tablet Take 500-1,000 mg by mouth every 6 (six) hours as needed for mild pain or headache.   Yes [provider]  ibuprofen (ADVIL) 600 MG tablet Take 1 tablet (600 mg total) by mouth every 8 (eight) hours as needed. Patient taking differently: Take 600 mg by mouth every 8 (eight) hours as needed (for pain). 06/28/21  Yes Regal, Tamala Fothergill, DPM  insulin glargine (LANTUS SOLOSTAR) 100 UNIT/ML Solostar Pen Inject 35 Units into the skin in the morning.   Yes [provider]  insulin lispro (HUMALOG KWIKPEN) 100 UNIT/ML KwikPen Inject 0-25 Units into the skin See admin instructions. Inject 0-25 units into the skin three to four times a day, PER SLIDING SCALE   Yes [provider]  levothyroxine (SYNTHROID, LEVOTHROID) 200 MCG tablet Take 200 mcg by mouth daily before breakfast.    Yes [provider]  losartan (COZAAR) 100 MG tablet Take 1 tablet (100 mg total) by mouth daily. 02/09/20 08/04/21 Yes Hall, Carole N, DO  pantoprazole (PROTONIX) 40 MG tablet Take 1 tablet (40 mg total) by mouth daily at 6 (six) AM. Patient taking differently: Take 40 mg by mouth daily before breakfast. 01/08/18  Yes Regalado, Belkys A, MD  albuterol (VENTOLIN HFA) 108 (90 Base) MCG/ACT inhaler Inhale 2 puffs into the lungs every 6 (six) hours as needed for wheezing or shortness of breath. 02/08/20   Kayleen Memos, DO  ascorbic acid (VITAMIN C) 500  MG tablet Take 500 mg by mouth daily.    [provider]  ASPIRIN 81 PO Take 81 mg by mouth daily.    [provider]  Blood Glucose Monitoring Suppl (ONETOUCH VERIO) w/Device KIT See admin instructions. 02/14/20   [provider]  clotrimazole (GYNE-LOTRIMIN) 1 % vaginal cream Place 1 Applicatorful vaginally at bedtime.    [provider]  diclofenac (VOLTAREN) 75 MG EC tablet Take 1 tablet (75 mg total) by mouth 2 (two) times daily. Patient not taking: Reported on 08/04/2021 06/27/21   Wallene Huh, DPM  ezetimibe-simvastatin (VYTORIN) 10-40 MG tablet Take 1 tablet by mouth daily. PATIENT MUST SCHEDULE APPOINTMENT FOR FUTURE REFILLS. FIRST ATTEMPT 01/15/21   Pixie Casino, MD  metoprolol succinate (TOPROL-XL) 25 MG 24 hr tablet Take 1 tablet by mouth daily.    [provider]  Mouthwash Compounding Base LIQD Swish and spit 15 mLs every 6 (six) hours as needed. Maalox 80 mL;  nystatin 100,000 units/ML  80 mL; viscous lidocaine 2%- 5m Patient not taking: Reported on 07/22/2021 06/23/20   LChase Picket MD  nystatin (MYCOSTATIN) 100000 UNIT/ML suspension Take 5 mLs (500,000 Units total) by mouth 4 (four) times daily. Patient not taking: Reported on 07/22/2021 08/06/20   HLuna Fuse MD  [facility]  [facility]   [provider]    Physical Exam:  Vitals:   08/04/21 1800 08/04/21 1818 08/04/21 1830 08/04/21 2000  BP: (!) 149/118  (!) 164/58 (!) 163/76  Pulse: 95  97 (!) 105  Resp: (!) 23  (!) 39 19  Temp:  100 F (37.8 C)    TempSrc:  Oral    SpO2: 100%  97% 99%  Weight:      Height:        Constitutional: Lethargic but arousable, oriented x 3, no associated distress.   Skin: no rashes, no lesions, poor skin turgor noted. Eyes: Pupils are equally reactive to light.  No evidence of scleral icterus or conjunctival pallor.  ENMT: Dry mucous membranes noted.  Posterior pharynx clear of any exudate or lesions.   Neck:  normal, supple, no masses, no thyromegaly.  No evidence of jugular venous distension.   Respiratory: Scattered ronchi bilaterally with faint rales.  No wheezing,  no crackles. Normal respiratory effort. No accessory muscle use.  Cardiovascular: Tachycardic rate with regular rhythm, no murmurs / rubs / gallops. No extremity edema. 2+ pedal pulses. No carotid bruits.  Chest:   Nontender without crepitus or deformity.   Back:   Nontender without crepitus or deformity. Abdomen: Abdomen is soft and nontender.  No evidence of intra-abdominal masses.  Positive bowel sounds noted in all quadrants.   Musculoskeletal: No joint deformity upper and lower extremities. Good ROM, no contractures. Normal muscle tone.  Neurologic: Lethargic but arousable, oriented x3.  CN 2-12 grossly intact. Sensation intact.  Patient moving all 4 extremities spontaneously.  Patient is following all commands.  Patient is responsive to verbal stimuli.   Psychiatric: Significant lethargy being assessment difficult.  Normal mood with flat affect.  Patient seems to possess insight as to their current situation.    Data Reviewed:  I have personally reviewed and interpreted labs, imaging.  Significant findings are:  CBC revealing white blood cell count of 7.0, hemoglobin 8.1, hematocrit 23.8, platelet count of 203. Lactic acid 4.3 followed by 6.9 Beta-hydroxybutyrate acid 2.34 VBG revealing pH 7.26, PCO2 20, PO2 of 64 Stool Hemoccult negative Chest x-ray personally reviewed revealing possible patchy left lower lobe infiltrate.  EKG: Personally reviewed.  Rhythm is sinus tachycardia with heart rate of 109 bpm.  No dynamic ST segment changes appreciated.   Assessment and Plan: * Type 2 diabetes mellitus with ketoacidosis without coma, with long-term current use of insulin (HCC) Patient exhibiting signs of diabetic ketoacidosis notable anion gap acidosis and elevated beta hydroxybutyrate in the setting of hyperglycemia Etiology  is likely secondary to noncompliance over the past several weeks in addition to possible underlying infectious process Patient's been placed on insulin infusion Patient is being hydrated aggressively with intravenous isotonic fluids Performing serial chemistries and serial beta hydroxybutyrate levels Patient is currently n.p.o. with exception of ice chips and sips of water Patient will remain on insulin infusion until anion gap is closed  Acute metabolic encephalopathy Patient exhibiting progressive lethargy and confusion No clinical evidence of meningitis No evidence of underlying significant hypercapnia No focal neurologic deficit Patient possibly encephalopathic secondary to profound volume depletion, hyperglycemia  and possible underlying infectious process Hydrating patient with intravenous isotonic fluids Correcting hyperglycemia and anion gap acidosis Evaluated patient for underlying infectious process Obtaining vitamin B12, folate, TSH, ammonia  SIRS (systemic inflammatory response syndrome) (HCC) Patient presenting with multiple SIRS criteria and evidence of concurrent organ dysfunction including acute kidney injury lactic acidosis and metabolic encephalopathy  Chest x-ray reveals no definitive evidence of infection Urinalysis unremarkable Obtaining COVID-19 PCR testing, CT imaging of the chest abdomen and pelvis Obtaining procalcitonin and CRP Aggressive intravenous volume resuscitation Considering severity of illness, continue broad-spectrum intravenous antibiotic therapy for now, will discontinue if cultures remain negative for 48 hours. Close clinical monitoring as patient is at high risk of rapid clinical decompensation   Lactic acidosis Lactic acidosis likely secondary to underlying infection with concurrent volume depletion Hydrate patient intravenous isotonic fluids while concurrently treating underlying infection Monitoring serial lactic acid levels to ensure  downtrending and resolution.   Acute anemia Remarkable drop in hemoglobin to 8.1 from 12.5 approximately 2 weeks ago No clinical evidence of bleeding Stool Hemoccult negative Bilirubin slightly elevated being about concern for possible hemolytic process PT and PTT unremarkable Obtaining pathology reviewed peripheral smear, LDH, haptoglobin and serial hepatic function test Monitoring hemoglobin and hematocrit with serial CBCs Holding any anticoagulant for now Intravenous Protonix for now Day team to consult hematology or gastroenterology in the morning based on results  Acute renal failure superimposed on stage 3a chronic kidney disease ([place]) Patient exhibiting evidence of acute kidney injury secondary to significant volume depletion Creatinine is currently 1.63 an increase compared to baseline of 1.18 suggestive of mild acute kidney injury Hydrating patient with intravenous isotonic fluids. Strict input and output monitoring Monitoring renal function and electrolytes with serial chemistries Avoiding nephrotoxic agents if at all possible   Essential hypertension Continue home regimen of metoprolol as blood pressure tolerates As needed intravenous antihypertensives for markedly elevated blood pressure  Hypothyroidism Resume home regimen of Synthroid Obtaining TSH  GERD without esophagitis Intravenous PPI for now as noted above       Code Status:  Full code  code status decision has been confirmed with: son/patient Family Communication: Son has been updated on plan of care via phone conversation  Consults: None  Severity of Illness:  The appropriate patient status for this patient is INPATIENT. Inpatient status is judged to be reasonable and necessary in order to provide the required intensity of service to ensure the patient's safety. The patient's presenting symptoms, physical exam findings, and initial radiographic and laboratory data in the context of their chronic  comorbidities is felt to place them at high risk for further clinical deterioration. Furthermore, it is not anticipated that the patient will be medically stable for discharge from the hospital within 2 midnights of admission.   * I certify that at the point of admission it is my clinical judgment that the patient will require inpatient hospital care spanning beyond 2 midnights from the point of admission due to high intensity of service, high risk for further deterioration and high frequency of surveillance required.*  Author:  Vernelle Emerald MD  08/04/2021 9:28 PM

## 2021-08-04 NOTE — Assessment & Plan Note (Signed)
.   Resume home regimen of Synthroid . Obtaining TSH

## 2021-08-04 NOTE — Sepsis Progress Note (Addendum)
[facility] following for Sepsis Protocol. Initial Lactic at 13:15 was 4.3, increased to 6.9 at 15:15 and decreased to 1.2 at 17:01, HTN, BC, ABx's and Fluids given appropriately

## 2021-08-04 NOTE — ED Notes (Signed)
Lab draw for recollect unsuccessful at this time, RN aware.

## 2021-08-04 NOTE — Sepsis Progress Note (Signed)
[facility] monitoring code sepsis 

## 2021-08-04 NOTE — Progress Notes (Signed)
A consult was received from an ED physician for vancomycin and cefepime per pharmacy dosing.  The patient's profile has been reviewed for ht/wt/allergies/indication/available labs.   A one time order has been placed for cefepime 2g and vancomycin 1g.  Further antibiotics/pharmacy consults should be ordered by admitting physician if indicated.                       Thank you, Peggyann Juba, PharmD, BCPS 08/04/2021  1:11 PM

## 2021-08-04 NOTE — ED Notes (Signed)
Confirmed with attending Shalhoub MD Endo tool Insulin drip start rate at 11.5 unit/hr. Orderd to start drip per ENDO tool at 11.5 units by [facility] MD

## 2021-08-04 NOTE — ED Notes (Signed)
Attending MD- Shalhoub , notified of pt temperature. Will continue to monitor.

## 2021-08-04 NOTE — Assessment & Plan Note (Signed)
   Patient exhibiting signs of diabetic ketoacidosis notable anion gap acidosis and elevated beta hydroxybutyrate in the setting of hyperglycemia  Etiology is likely secondary to noncompliance over the past several weeks in addition to possible underlying infectious process Patient's been placed on insulin infusion Patient is being hydrated aggressively with intravenous isotonic fluids Performing serial chemistries and serial beta hydroxybutyrate levels Patient is currently n.p.o. with exception of ice chips and sips of water Patient will remain on insulin infusion until anion gap is closed

## 2021-08-04 NOTE — Assessment & Plan Note (Signed)
   Lactic acidosis likely secondary to underlying infection with concurrent volume depletion  Hydrate patient intravenous isotonic fluids while concurrently treating underlying infection  Monitoring serial lactic acid levels to ensure downtrending and resolution.

## 2021-08-04 NOTE — Assessment & Plan Note (Addendum)
   Holding home regimen of Losartan  As needed intravenous antihypertensives for markedly elevated blood pressure

## 2021-08-04 NOTE — ED Provider Notes (Signed)
Accepted handoff at shift change from [facility]. Please see prior provider note for more detail.   Briefly: Patient is 71 y.o.   "[name] is a 71 y.o. female with a history of diabetes, hypertension, NSTEMI, DVT, factor V Leiden, GERD, who presents to the emergency department accompanied by her son for evaluation of hyperglycemia, patient was also found to be febrile on arrival.  The majority of the history is provided by patient's son who reports that she has been having high blood sugars over the past few weeks, with sugars often in the 400-500s.  She had also been complaining of some intermittent weakness and dizziness over the past few days and so yesterday he took her to her PCP and found out that she had not been taking her Lantus.  Hemoglobin A1c at the office was 11.3.  Last night she came home and he made sure that she took her medications but this morning when he came to check on her he noticed that she was sleepier than usual, had urinated on herself and was not acting like herself so brought her in for further evaluation.  He does not know of any fevers at home.  Patient reports she has had an occasional cough but no chest pain or shortness of breath.  She denies abdominal pain, nausea or vomiting.  Denies any dysuria but has had urinary frequency.  Reports she is just felt weak and fatigued especially today.  Son reports that he gave her 20 units in total of Humalog this morning in total over multiple small doses but did not see improvement in her mental status so brought her in.   The history is provided by the patient and a relative.  Fever Associated symptoms: chills and cough   Associated symptoms: no chest pain, no congestion, no dysuria, no myalgias, no nausea, no rash and no vomiting  "    DDX: concern for The differential diagnosis includes DKA, HHS, hyperglycemia due to noncompliance, sepsis, pneumonia, UTI, intra-abdominal infection, cellulitis, viral URI.    Plan: follow up on labs and admit    Physical Exam  BP (!) 165/69   Pulse 96   Temp 100.1 F (37.8 C) (Oral)   Resp (!) 22   Ht '5\' 4"'$  (1.626 m)   Wt 78 kg   SpO2 100%   BMI 29.52 kg/m   Physical Exam Vitals and nursing note reviewed.  Constitutional:      Appearance: She is ill-appearing. She is not toxic-appearing.     Comments: Ill appearing   HENT:     Head: Normocephalic and atraumatic.     Nose: Nose normal.     Mouth/Throat:     Mouth: Mucous membranes are dry.  Eyes:     General: No scleral icterus. Cardiovascular:     Rate and Rhythm: Normal rate and regular rhythm.     Pulses: Normal pulses.     Heart sounds: Normal heart sounds.     Comments: Mild tachycardia 105 Pulmonary:     Effort: Pulmonary effort is normal. No respiratory distress.     Breath sounds: No wheezing.  Abdominal:     Palpations: Abdomen is soft.     Tenderness: There is no abdominal tenderness. There is no guarding or rebound.  Musculoskeletal:     Cervical back: Normal range of motion.     Right lower leg: No edema.     Left lower leg: No edema.  Skin:    General:  Skin is warm and dry.     Capillary Refill: Capillary refill takes less than 2 seconds.  Neurological:     Mental Status: She is alert. Mental status is at baseline.  Psychiatric:        Mood and Affect: Mood normal.        Behavior: Behavior normal.     Procedures  Ultrasound ED Peripheral IV (Provider)  Date/Time: 08/04/2021 5:00 PM  Performed by: Tedd Sias, PA Authorized by: Tedd Sias, PA   Procedure details:    Skin Prep: chlorhexidine gluconate     Location:  Right AC   Angiocath:  20 G   Bedside Ultrasound Guided: Yes     Images: not archived     Patient tolerated procedure without complications: Yes     Dressing applied: Yes   Comments:     20-gauge IV placed in right upper extremity just proximal to the [facility] Mar[facility] KitchenCritical Care  Performed by: Tedd Sias, PA Authorized by: Tedd Sias, PA   Critical care provider statement:    Critical care time (minutes):  35   Critical care time was exclusive of:  Separately billable procedures and treating other patients and teaching time   Critical care was necessary to treat or prevent imminent or life-threatening deterioration of the following conditions:  Sepsis and metabolic crisis   Critical care was time spent personally by me on the following activities:  Development of treatment plan with patient or surrogate, review of old charts, re-evaluation of patient's condition, pulse oximetry, ordering and review of radiographic studies, ordering and review of laboratory studies, ordering and performing treatments and interventions, obtaining history from patient or surrogate, examination of patient, evaluation of patient's response to treatment and blood draw for specimens   Care discussed with: admitting provider    Results for orders placed or performed during the hospital encounter of 08/04/21  Resp Panel by RT-PCR (Flu A&B, Covid) Anterior Nasal Swab   Specimen: Anterior Nasal Swab  Result Value Ref Range   SARS Coronavirus 2 by RT PCR NEGATIVE NEGATIVE   Influenza A by PCR NEGATIVE NEGATIVE   Influenza B by PCR NEGATIVE NEGATIVE  Lactic acid, plasma  Result Value Ref Range   Lactic Acid, Venous 4.3 (HH) 0.5 - 1.9 mmol/L  Lactic acid, plasma  Result Value Ref Range   Lactic Acid, Venous 6.9 (HH) 0.5 - 1.9 mmol/L  Comprehensive metabolic panel  Result Value Ref Range   Sodium 129 (L) 135 - 145 mmol/L   Potassium 5.8 (H) 3.5 - 5.1 mmol/L   Chloride 102 98 - 111 mmol/L   CO2 14 (L) 22 - 32 mmol/L   Glucose, Bld 255 (H) 70 - 99 mg/dL   BUN 17 8 - 23 mg/dL   Creatinine, Ser 1.63 (H) 0.44 - 1.00 mg/dL   Calcium 9.3 8.9 - 10.3 mg/dL   Total Protein 8.0 6.5 - 8.1 g/dL   Albumin 3.4 (L) 3.5 - 5.0 g/dL   AST 47 (H) 15 - 41 U/L   ALT 20 0 - 44 U/L   Alkaline Phosphatase 75 38 - 126 U/L   Total Bilirubin 1.6 (H) 0.3 - 1.2  mg/dL   GFR, Estimated 34 (L) >60 mL/min   Anion gap 13 5 - 15  Protime-INR  Result Value Ref Range   Prothrombin Time 12.0 11.4 - 15.2 seconds   INR 0.9 0.8 - 1.2  APTT  Result Value Ref Range   aPTT <20 (  L) 24 - 36 seconds  Urinalysis, Routine w reflex microscopic  Result Value Ref Range   Color, Urine YELLOW YELLOW   APPearance CLEAR CLEAR   Specific Gravity, Urine 1.011 1.005 - 1.030   pH 5.0 5.0 - 8.0   Glucose, UA >=500 (A) NEGATIVE mg/dL   Hgb urine dipstick SMALL (A) NEGATIVE   Bilirubin Urine NEGATIVE NEGATIVE   Ketones, ur 80 (A) NEGATIVE mg/dL   Protein, ur 100 (A) NEGATIVE mg/dL   Nitrite NEGATIVE NEGATIVE   Leukocytes,Ua NEGATIVE NEGATIVE   RBC / HPF 0-5 0 - 5 RBC/hpf   WBC, UA 0-5 0 - 5 WBC/hpf   Bacteria, UA FEW (A) NONE SEEN   Squamous Epithelial / LPF 0-5 0 - 5  Beta-hydroxybutyric acid  Result Value Ref Range   Beta-Hydroxybutyric Acid 1.78 (H) 0.05 - 0.27 mmol/L  Beta-hydroxybutyric acid  Result Value Ref Range   Beta-Hydroxybutyric Acid 2.34 (H) 0.05 - 0.27 mmol/L  Blood gas, venous  Result Value Ref Range   pH, Ven 7.26 7.25 - 7.43   pCO2, Ven 20 (L) 44 - 60 mmHg   pO2, Ven 64 (H) 32 - 45 mmHg   Bicarbonate 9.0 (L) 20.0 - 28.0 mmol/L   Acid-base deficit 16.5 (H) 0.0 - 2.0 mmol/L   O2 Saturation 91.5 %   Patient temperature 37.0   CBC with Differential/Platelet  Result Value Ref Range   WBC 7.0 4.0 - 10.5 K/uL   RBC 2.84 (L) 3.87 - 5.11 MIL/uL   Hemoglobin 8.1 (L) 12.0 - 15.0 g/dL   HCT 23.8 (L) 36.0 - 46.0 %   MCV 83.8 80.0 - 100.0 fL   MCH 28.5 26.0 - 34.0 pg   MCHC 34.0 30.0 - 36.0 g/dL   RDW 14.6 11.5 - 15.5 %   Platelets 203 150 - 400 K/uL   nRBC 0.0 0.0 - 0.2 %   Neutrophils Relative % 74 %   Neutro Abs 5.2 1.7 - 7.7 K/uL   Lymphocytes Relative 11 %   Lymphs Abs 0.8 0.7 - 4.0 K/uL   Monocytes Relative 14 %   Monocytes Absolute 1.0 0.1 - 1.0 K/uL   Eosinophils Relative 0 %   Eosinophils Absolute 0.0 0.0 - 0.5 K/uL   Basophils  Relative 0 %   Basophils Absolute 0.0 0.0 - 0.1 K/uL   Immature Granulocytes 1 %   Abs Immature Granulocytes 0.05 0.00 - 0.07 K/uL  Basic metabolic panel  Result Value Ref Range   Sodium 131 (L) 135 - 145 mmol/L   Potassium 3.6 3.5 - 5.1 mmol/L   Chloride 108 98 - 111 mmol/L   CO2 9 (L) 22 - 32 mmol/L   Glucose, Bld 174 (H) 70 - 99 mg/dL   BUN 11 8 - 23 mg/dL   Creatinine, Ser 0.87 0.44 - 1.00 mg/dL   Calcium 7.2 (L) 8.9 - 10.3 mg/dL   GFR, Estimated >60 >60 mL/min   Anion gap 14 5 - 15  Basic metabolic panel  Result Value Ref Range   Sodium 131 (L) 135 - 145 mmol/L   Potassium 4.1 3.5 - 5.1 mmol/L   Chloride 102 98 - 111 mmol/L   CO2 13 (L) 22 - 32 mmol/L   Glucose, Bld 327 (H) 70 - 99 mg/dL   BUN 15 8 - 23 mg/dL   Creatinine, Ser 1.28 (H) 0.44 - 1.00 mg/dL   Calcium 8.6 (L) 8.9 - 10.3 mg/dL   GFR, Estimated 45 (L) >60 mL/min  Anion gap 16 (H) 5 - 15  Basic metabolic panel  Result Value Ref Range   Sodium 132 (L) 135 - 145 mmol/L   Potassium 4.0 3.5 - 5.1 mmol/L   Chloride 103 98 - 111 mmol/L   CO2 13 (L) 22 - 32 mmol/L   Glucose, Bld 327 (H) 70 - 99 mg/dL   BUN 12 8 - 23 mg/dL   Creatinine, Ser 1.24 (H) 0.44 - 1.00 mg/dL   Calcium 8.7 (L) 8.9 - 10.3 mg/dL   GFR, Estimated 47 (L) >60 mL/min   Anion gap 16 (H) 5 - 15  Lactic acid, plasma  Result Value Ref Range   Lactic Acid, Venous 1.2 0.5 - 1.9 mmol/L  Reticulocytes  Result Value Ref Range   Retic Ct Pct 0.7 0.4 - 3.1 %   RBC. 3.78 (L) 3.87 - 5.11 MIL/uL   Retic Count, Absolute 27.6 19.0 - 186.0 K/uL   Immature Retic Fract 12.2 2.3 - 15.9 %  Lactate dehydrogenase  Result Value Ref Range   LDH 190 98 - 192 U/L  CBG monitoring, ED  Result Value Ref Range   Glucose-Capillary 281 (H) 70 - 99 mg/dL  I-stat chem 8, ED  Result Value Ref Range   Sodium 131 (L) 135 - 145 mmol/L   Potassium 3.7 3.5 - 5.1 mmol/L   Chloride 103 98 - 111 mmol/L   BUN 10 8 - 23 mg/dL   Creatinine, Ser 0.80 0.44 - 1.00 mg/dL    Glucose, Bld 190 (H) 70 - 99 mg/dL   Calcium, Ion 0.97 (L) 1.15 - 1.40 mmol/L   TCO2 11 (L) 22 - 32 mmol/L   Hemoglobin 8.8 (L) 12.0 - 15.0 g/dL   HCT 26.0 (L) 36.0 - 46.0 %  CBG monitoring, ED  Result Value Ref Range   Glucose-Capillary 308 (H) 70 - 99 mg/dL  POC CBG, ED  Result Value Ref Range   Glucose-Capillary 352 (H) 70 - 99 mg/dL  POC occult blood, ED  Result Value Ref Range   Fecal Occult Bld NEGATIVE NEGATIVE  CBG monitoring, ED  Result Value Ref Range   Glucose-Capillary 306 (H) 70 - 99 mg/dL  CBG monitoring, ED  Result Value Ref Range   Glucose-Capillary 307 (H) 70 - 99 mg/dL  CBG monitoring, ED  Result Value Ref Range   Glucose-Capillary 203 (H) 70 - 99 mg/dL   CT ABDOMEN PELVIS W CONTRAST  Result Date: 08/04/2021 CLINICAL DATA:  Abdominal pain, acute, nonlocalized; Sepsis, hyperglycemia, fever EXAM: CT CHEST, ABDOMEN, AND PELVIS WITH CONTRAST TECHNIQUE: Multidetector CT imaging of the chest, abdomen and pelvis was performed following the standard protocol during bolus administration of intravenous contrast. RADIATION DOSE REDUCTION: This exam was performed according to the departmental dose-optimization program which includes automated exposure control, adjustment of the mA and/or kV according to patient size and/or use of iterative reconstruction technique. CONTRAST:  190m OMNIPAQUE IOHEXOL 300 MG/ML  SOLN COMPARISON:  None Available. FINDINGS: CT CHEST FINDINGS Cardiovascular: No significant vascular findings. Normal heart size. No pericardial effusion. Mediastinum/Nodes: No enlarged mediastinal, hilar, or axillary lymph nodes. Thyroid gland, trachea, and esophagus demonstrate no significant findings. Lungs/Pleura: There is focal consolidation within the right upper lobe, likely infectious in the appropriate clinical setting. Minimal scattered infiltrate is also seen within the peripheral left upper lobe. Mild right basilar atelectasis. No pneumothorax or pleural effusion.  No central obstructing lesion. Musculoskeletal: Osseous structures are age-appropriate. No acute bone abnormality. No lytic or blastic bone lesion. CT  ABDOMEN PELVIS FINDINGS Hepatobiliary: No focal liver abnormality is seen. No gallstones, gallbladder wall thickening, or biliary dilatation. Pancreas: Unremarkable Spleen: Unremarkable Adrenals/Urinary Tract: The adrenal glands are unremarkable. The kidneys are normal in position. There is extensive cortical scarring and moderate atrophy of the right kidney. No hydronephrosis, perinephric fluid collections or inflammatory stranding, or intrarenal or ureteral calculi are identified. Left kidney is unremarkable. Bladder is unremarkable. Stomach/Bowel: Stomach is within normal limits. Appendix appears normal. No evidence of bowel wall thickening, distention, or inflammatory changes. Vascular/Lymphatic: Mild atherosclerotic calcification within the iliac vasculature. No aortic aneurysm. No pathologic adenopathy within the abdomen and pelvis. Reproductive: Multiple lobulated enhancing masses are seen within the uterus most in keeping with multiple uterine fibroids. No adnexal masses. Other: Tiny fat containing umbilical hernia. Musculoskeletal: Osseous structures are age-appropriate. No acute bone abnormality. IMPRESSION: 1. Focal consolidation within the right upper lobe, likely infectious in the appropriate clinical setting. Minimal scattered infiltrate within the peripheral left upper lobe, also likely infectious in nature. No central obstructing mass. 2. No acute intra-abdominal pathology identified. 3. Moderate atrophy and cortical scarring of the right kidney. 4. Multiple uterine fibroids. Electronically Signed   By: Fidela Salisbury M.D.   On: 08/04/2021 22:05   CT Chest W Contrast  Result Date: 08/04/2021 CLINICAL DATA:  Abdominal pain, acute, nonlocalized; Sepsis, hyperglycemia, fever EXAM: CT CHEST, ABDOMEN, AND PELVIS WITH CONTRAST TECHNIQUE: Multidetector CT  imaging of the chest, abdomen and pelvis was performed following the standard protocol during bolus administration of intravenous contrast. RADIATION DOSE REDUCTION: This exam was performed according to the departmental dose-optimization program which includes automated exposure control, adjustment of the mA and/or kV according to patient size and/or use of iterative reconstruction technique. CONTRAST:  160m OMNIPAQUE IOHEXOL 300 MG/ML  SOLN COMPARISON:  None Available. FINDINGS: CT CHEST FINDINGS Cardiovascular: No significant vascular findings. Normal heart size. No pericardial effusion. Mediastinum/Nodes: No enlarged mediastinal, hilar, or axillary lymph nodes. Thyroid gland, trachea, and esophagus demonstrate no significant findings. Lungs/Pleura: There is focal consolidation within the right upper lobe, likely infectious in the appropriate clinical setting. Minimal scattered infiltrate is also seen within the peripheral left upper lobe. Mild right basilar atelectasis. No pneumothorax or pleural effusion. No central obstructing lesion. Musculoskeletal: Osseous structures are age-appropriate. No acute bone abnormality. No lytic or blastic bone lesion. CT ABDOMEN PELVIS FINDINGS Hepatobiliary: No focal liver abnormality is seen. No gallstones, gallbladder wall thickening, or biliary dilatation. Pancreas: Unremarkable Spleen: Unremarkable Adrenals/Urinary Tract: The adrenal glands are unremarkable. The kidneys are normal in position. There is extensive cortical scarring and moderate atrophy of the right kidney. No hydronephrosis, perinephric fluid collections or inflammatory stranding, or intrarenal or ureteral calculi are identified. Left kidney is unremarkable. Bladder is unremarkable. Stomach/Bowel: Stomach is within normal limits. Appendix appears normal. No evidence of bowel wall thickening, distention, or inflammatory changes. Vascular/Lymphatic: Mild atherosclerotic calcification within the iliac  vasculature. No aortic aneurysm. No pathologic adenopathy within the abdomen and pelvis. Reproductive: Multiple lobulated enhancing masses are seen within the uterus most in keeping with multiple uterine fibroids. No adnexal masses. Other: Tiny fat containing umbilical hernia. Musculoskeletal: Osseous structures are age-appropriate. No acute bone abnormality. IMPRESSION: 1. Focal consolidation within the right upper lobe, likely infectious in the appropriate clinical setting. Minimal scattered infiltrate within the peripheral left upper lobe, also likely infectious in nature. No central obstructing mass. 2. No acute intra-abdominal pathology identified. 3. Moderate atrophy and cortical scarring of the right kidney. 4. Multiple uterine fibroids. Electronically Signed  By: Fidela Salisbury M.D.   On: 08/04/2021 22:05   DG Chest Port 1 View  Result Date: 08/04/2021 CLINICAL DATA:  Possible sepsis EXAM: PORTABLE CHEST 1 VIEW COMPARISON:  07/21/2021 FINDINGS: Cardiac size is within normal limits. There are no signs of pulmonary edema. Increased markings are seen in the medial lower lung fields. There is poor inspiration. There is no pleural effusion or pneumothorax. IMPRESSION: Increased markings in the medial lower lung fields may suggest crowding of bronchovascular structures due to poor inspiration or small foci of atelectasis/pneumonia. Electronically Signed   By: Elmer Picker M.D.   On: 08/04/2021 13:57   DG Chest 2 View  Result Date: 07/21/2021 CLINICAL DATA:  chest pain EXAM: CHEST - 2 VIEW COMPARISON:  Chest x-ray 02/06/2020, CT chest 02/03/2020 FINDINGS: The heart and mediastinal contours are within normal limits. No focal consolidation. No pulmonary edema. No pleural effusion. No pneumothorax. No acute osseous abnormality. IMPRESSION: No active cardiopulmonary disease. Electronically Signed   By: Iven Finn M.D.   On: 07/21/2021 23:54     ED Course / MDM    Medical Decision Making Amount  and/or Complexity of Data Reviewed Labs: ordered. Radiology: ordered. ECG/medicine tests: ordered.  Risk OTC drugs. Prescription drug management. Decision regarding hospitalization.    Patient with hyperglycemia and anemic Elevated anion gap seems to be some   Patient did present tachycardic and febrile and was initiated as a code sepsis with 30 mL/kg fluid bolus and empiric antibiotics.  This may be secondary to the pneumonia that she has on chest x-ray although this is not a particularly impressive looking chest x-ray her symptoms that began yesterday.  Urinalysis with ketones and there is no anion gap present with hyperglycemia.  Will initiate IV insulin, no hyperkalemia on most recent BMP.  Lactic acidosis which seems to have down trended.  Patient is currently on broad-spectrum antibiotics, has been fluid resuscitated and has received some insulin  I have placed an order for Tylenol for 8 PM however it was not given until later and patient's fever did return.  She is admitted to [place] [facility] of hospital service.    Tedd Sias, [place] 08/04/21 2224    Drenda Freeze, MD 08/04/21 [phone]

## 2021-08-04 NOTE — Assessment & Plan Note (Signed)
   Patient exhibiting progressive lethargy and confusion  No clinical evidence of meningitis  No evidence of underlying significant hypercapnia  No focal neurologic deficit  Patient possibly encephalopathic secondary to profound volume depletion, hyperglycemia and possible underlying infectious process  Hydrating patient with intravenous isotonic fluids  Correcting hyperglycemia and anion gap acidosis  Evaluated patient for underlying infectious process  Obtaining vitamin B12, folate, TSH, ammonia

## 2021-08-04 NOTE — ED Provider Notes (Signed)
[facility]-EMERGENCY DEPT Provider Note   CSN: [id number] Arrival date & time: 08/04/21  1215     History  Chief Complaint  Patient presents with   Hyperglycemia   Fever    [name] is a 71 y.o. female.  [name] is a 71 y.o. female with a history of diabetes, hypertension, NSTEMI, DVT, factor V Leiden, GERD, who presents to the emergency department accompanied by her son for evaluation of hyperglycemia, patient was also found to be febrile on arrival.  The majority of the history is provided by patient's son who reports that she has been having high blood sugars over the past few weeks, with sugars often in the 400-500s.  She had also been complaining of some intermittent weakness and dizziness over the past few days and so yesterday he took her to her PCP and found out that she had not been taking her Lantus.  Hemoglobin A1c at the office was 11.3.  Last night she came home and he made sure that she took her medications but this morning when he came to check on her he noticed that she was sleepier than usual, had urinated on herself and was not acting like herself so brought her in for further evaluation.  He does not know of any fevers at home.  Patient reports she has had an occasional cough but no chest pain or shortness of breath.  She denies abdominal pain, nausea or vomiting.  Denies any dysuria but has had urinary frequency.  Reports she is just felt weak and fatigued especially today.  Son reports that he gave her 20 units in total of Humalog this morning in total over multiple small doses but did not see improvement in her mental status so brought her in.  The history is provided by the patient and a relative.  Fever Associated symptoms: chills and cough   Associated symptoms: no chest pain, no congestion, no dysuria, no myalgias, no nausea, no rash and no vomiting        Home Medications Prior to Admission medications   Medication Sig  Start Date End Date Taking? Authorizing Provider  acetaminophen (TYLENOL) 500 MG tablet Take 1,000 mg by mouth every 6 (six) hours as needed for mild pain.    [provider]  albuterol (VENTOLIN HFA) 108 (90 Base) MCG/ACT inhaler Inhale 2 puffs into the lungs every 6 (six) hours as needed for wheezing or shortness of breath. Patient not taking: Reported on 07/21/2021 02/08/20   Darlin Drop, DO  ascorbic acid (VITAMIN C) 500 MG tablet Take 500 mg by mouth daily.    [provider]  ASPIRIN 81 PO Take 1 tablet by mouth daily as needed (pain).    [provider]  Blood Glucose Monitoring Suppl (ONETOUCH VERIO) w/Device KIT See admin instructions. 02/14/20   [provider]  clotrimazole (GYNE-LOTRIMIN) 1 % vaginal cream Place 1 Applicatorful vaginally at bedtime.    [provider]  diclofenac (VOLTAREN) 75 MG EC tablet Take 1 tablet (75 mg total) by mouth 2 (two) times daily. 06/27/21   Lenn Sink, DPM  ezetimibe-simvastatin (VYTORIN) 10-40 MG tablet Take 1 tablet by mouth daily. PATIENT MUST SCHEDULE APPOINTMENT FOR FUTURE REFILLS. FIRST ATTEMPT 01/15/21   Chrystie Nose, MD  ibuprofen (ADVIL) 600 MG tablet Take 1 tablet (600 mg total) by mouth every 8 (eight) hours as needed. Patient not taking: Reported on 07/22/2021 06/28/21   Lenn Sink, DPM  insulin lispro (HUMALOG) 100 UNIT/ML injection Inject 5-10 Units into the skin 3 (three) times daily with meals.    [provider]  levothyroxine (SYNTHROID, LEVOTHROID) 200 MCG tablet Take 200 mcg by mouth daily before breakfast.     [provider]  losartan (COZAAR) 100 MG tablet Take 1 tablet (100 mg total) by mouth daily. 02/09/20 07/22/21  Darlin Drop, DO  metoprolol succinate (TOPROL-XL) 25 MG 24 hr tablet Take 1 tablet by mouth daily.    [provider]  Mouthwash Compounding Base LIQD Swish and spit 15 mLs every 6 (six) hours as needed. Maalox 80 mL; nystatin 100,000  units/ML  80 mL; viscous lidocaine 2%- 74ml Patient not taking: Reported on 07/22/2021 06/23/20   Merrilee Jansky, MD  nystatin (MYCOSTATIN) 100000 UNIT/ML suspension Take 5 mLs (500,000 Units total) by mouth 4 (four) times daily. Patient not taking: Reported on 07/22/2021 08/06/20   Cheryll Cockayne, MD  [facility] ULTRA test strip  01/25/20   [provider]  pantoprazole (PROTONIX) 40 MG tablet Take 1 tablet (40 mg total) by mouth daily at 6 (six) AM. 01/08/18   Regalado, Belkys A, MD  predniSONE (DELTASONE) 10 MG tablet Take 1 tablet (10 mg total) by mouth daily. Take 30 mg daily x2 days, then, Take 20 mg daily x2 days, then, Take 10 mg daily x2 days, then, Take 5 mg daily x2 days, then, stop. Patient not taking: Reported on 06/23/2020 02/08/20   Darlin Drop, DO      Allergies    Patient has no known allergies.    Review of Systems   Review of Systems  Constitutional:  Positive for chills, fatigue and fever.  HENT:  Negative for congestion.   Respiratory:  Positive for cough. Negative for shortness of breath.   Cardiovascular:  Negative for chest pain.  Gastrointestinal:  Negative for abdominal pain, nausea and vomiting.  Endocrine: Positive for polyuria.  Genitourinary:  Positive for frequency. Negative for dysuria.  Musculoskeletal:  Negative for arthralgias and myalgias.  Skin:  Negative for color change and rash.  Neurological:  Positive for weakness (Generalized). Negative for numbness.  All other systems reviewed and are negative.   Physical Exam Updated Vital Signs BP (!) 176/81   Pulse (!) 110   Temp (!) 101.2 F (38.4 C) (Oral)   Resp 20   SpO2 100%  Physical Exam Vitals and nursing note reviewed.  Constitutional:      General: She is not in acute distress.    Appearance: She is well-developed. She is ill-appearing. She is not diaphoretic.     Comments: Patient is somnolent but easily awakens to verbal stimuli and is able to answer questions, patient  ill-appearing  HENT:     Head: Normocephalic and atraumatic.     Mouth/Throat:     Mouth: Mucous membranes are dry.  Eyes:     General:        Right eye: No discharge.        Left eye: No discharge.     Extraocular Movements: Extraocular movements intact.     Pupils: Pupils are equal, round, and reactive to light.  Cardiovascular:     Rate and Rhythm: Regular rhythm. Tachycardia present.     Pulses: Normal pulses.     Heart sounds: Normal heart sounds.  Pulmonary:     Effort: Pulmonary effort is normal. No respiratory distress.     Breath sounds: Normal breath sounds. No wheezing or rales.  Comments: Respirations equal and unlabored, patient able to speak in full sentences, lungs clear to auscultation bilaterally  Chest:     Chest wall: No tenderness.  Abdominal:     General: Bowel sounds are normal. There is no distension.     Palpations: Abdomen is soft. There is no mass.     Tenderness: There is no abdominal tenderness. There is no guarding.     Comments: Abdomen soft, nondistended, nontender to palpation in all quadrants without guarding or peritoneal signs  Musculoskeletal:        General: No deformity.     Right lower leg: No edema.     Left lower leg: No edema.  Skin:    General: Skin is warm and dry.     Capillary Refill: Capillary refill takes less than 2 seconds.  Neurological:     Mental Status: She is oriented to person, place, and time.     Coordination: Coordination normal.     Comments: Speech is clear, able to follow commands CN III-XII intact Normal strength in upper and lower extremities bilaterally including dorsiflexion and plantar flexion, strong and equal grip strength Sensation normal to light and sharp touch Moves extremities without ataxia, coordination intact  Psychiatric:        Mood and Affect: Mood normal.        Behavior: Behavior normal.     ED Results / Procedures / Treatments   Labs (all labs ordered are listed, but only abnormal  results are displayed) Labs Reviewed  LACTIC ACID, PLASMA - Abnormal; Notable for the following components:      Result Value   Lactic Acid, Venous 4.3 (*)    All other components within normal limits  LACTIC ACID, PLASMA - Abnormal; Notable for the following components:   Lactic Acid, Venous 6.9 (*)    All other components within normal limits  COMPREHENSIVE METABOLIC PANEL - Abnormal; Notable for the following components:   Sodium 129 (*)    Potassium 5.8 (*)    CO2 14 (*)    Glucose, Bld 255 (*)    Creatinine, Ser 1.63 (*)    Albumin 3.4 (*)    AST 47 (*)    Total Bilirubin 1.6 (*)    GFR, Estimated 34 (*)    All other components within normal limits  APTT - Abnormal; Notable for the following components:   aPTT <20 (*)    All other components within normal limits  BETA-HYDROXYBUTYRIC ACID - Abnormal; Notable for the following components:   Beta-Hydroxybutyric Acid 1.78 (*)    All other components within normal limits  BLOOD GAS, VENOUS - Abnormal; Notable for the following components:   pCO2, Ven 20 (*)    pO2, Ven 64 (*)    Bicarbonate 9.0 (*)    Acid-base deficit 16.5 (*)    All other components within normal limits  CBC WITH DIFFERENTIAL/PLATELET - Abnormal; Notable for the following components:   RBC 2.84 (*)    Hemoglobin 8.1 (*)    HCT 23.8 (*)    All other components within normal limits  CBG MONITORING, ED - Abnormal; Notable for the following components:   Glucose-Capillary 281 (*)    All other components within normal limits  I-STAT CHEM 8, ED - Abnormal; Notable for the following components:   Sodium 131 (*)    Glucose, Bld 190 (*)    Calcium, Ion 0.97 (*)    TCO2 11 (*)    Hemoglobin 8.8 (*)  HCT 26.0 (*)    All other components within normal limits  RESP PANEL BY RT-PCR (FLU A&B, COVID) ARPGX2  CULTURE, BLOOD (ROUTINE X 2)  CULTURE, BLOOD (ROUTINE X 2)  URINE CULTURE  PROTIME-INR  CBC WITH DIFFERENTIAL/PLATELET  URINALYSIS, ROUTINE W REFLEX  MICROSCOPIC  BETA-HYDROXYBUTYRIC ACID  CBC WITH DIFFERENTIAL/PLATELET  OSMOLALITY  BASIC METABOLIC PANEL  BASIC METABOLIC PANEL  BASIC METABOLIC PANEL  BASIC METABOLIC PANEL  CBG MONITORING, ED    EKG EKG Interpretation  Date/Time:  Saturday August 04 2021 12:46:02 EDT Ventricular Rate:  109 PR Interval:  165 QRS Duration: 83 QT Interval:  332 QTC Calculation: 447 R Axis:   69 Text Interpretation: Sinus tachycardia Since last tracing rate faster Otherwise no significant change Confirmed by Daleen Bo [phone]) on 08/04/2021 2:25:10 PM  Radiology DG Chest Port 1 View  Result Date: 08/04/2021 CLINICAL DATA:  Possible sepsis EXAM: PORTABLE CHEST 1 VIEW COMPARISON:  07/21/2021 FINDINGS: Cardiac size is within normal limits. There are no signs of pulmonary edema. Increased markings are seen in the medial lower lung fields. There is poor inspiration. There is no pleural effusion or pneumothorax. IMPRESSION: Increased markings in the medial lower lung fields may suggest crowding of bronchovascular structures due to poor inspiration or small foci of atelectasis/pneumonia. Electronically Signed   By: Elmer Picker M.D.   On: 08/04/2021 13:57    Procedures .Critical Care  Performed by: Jacqlyn Larsen, PA-C Authorized by: Jacqlyn Larsen, PA-C   Critical care provider statement:    Critical care time (minutes):  30   Critical care was necessary to treat or prevent imminent or life-threatening deterioration of the following conditions:  Endocrine crisis and sepsis   Critical care was time spent personally by me on the following activities:  Development of treatment plan with patient or surrogate, discussions with consultants, evaluation of patient's response to treatment, examination of patient, ordering and review of laboratory studies, ordering and review of radiographic studies, ordering and performing treatments and interventions, pulse oximetry, re-evaluation of patient's condition  and review of old charts   Care discussed with: admitting provider       Medications Ordered in ED Medications  lactated ringers infusion (has no administration in time range)  lactated ringers bolus 1,000 mL (1,000 mLs Intravenous New Bag/Given 08/04/21 1331)    And  lactated ringers bolus 1,000 mL (has no administration in time range)    And  lactated ringers bolus 500 mL (has no administration in time range)  insulin regular, human (MYXREDLIN) 100 units/ 100 mL infusion (has no administration in time range)  lactated ringers infusion (has no administration in time range)  dextrose 5 % in lactated ringers infusion (has no administration in time range)  dextrose 50 % solution 0-50 mL (has no administration in time range)  potassium chloride 10 mEq in 100 mL IVPB (has no administration in time range)  ceFEPIme (MAXIPIME) 2 g in sodium chloride 0.9 % 100 mL IVPB (0 g Intravenous Stopped 08/04/21 1404)  metroNIDAZOLE (FLAGYL) IVPB 500 mg (0 mg Intravenous Stopped 08/04/21 1528)  vancomycin (VANCOCIN) IVPB 1000 mg/200 mL premix (1,000 mg Intravenous New Bag/Given 08/04/21 1446)  acetaminophen (TYLENOL) tablet 650 mg (650 mg Oral Given 08/04/21 1359)    ED Course/ Medical Decision Making/ A&P                           Medical Decision Making Amount and/or Complexity of Data Reviewed  Labs: ordered. Radiology: ordered. ECG/medicine tests: ordered.  Risk OTC drugs. Prescription drug management.   [name] is a 71 y.o. female presents to the ED for concern of hyperglycemia, weakness, fever, this involves an extensive number of treatment options, and is a complaint that carries with it a high risk of complications and morbidity.  The differential diagnosis includes DKA, HHS, hyperglycemia due to noncompliance, sepsis, pneumonia, UTI, intra-abdominal infection, cellulitis, viral URI   Additional history obtained:  Additional history obtained from son at bedside External records  from outside source obtained and reviewed including prior outpatient records, unable to review PCP visit from yesterday.   Lab Tests:  I Ordered, reviewed, and interpreted labs.  The pertinent results include: No leukocytosis, hemoglobin with acute drop at 8.1, initial metabolic panel with hyper kalemia and AKI with no anion gap, but this was from clotted sample, repeat i-STAT Chem-8 with expected hyponatremia, normal potassium, and calculated anion gap of 17, VBG consistent with metabolic acidosis with respiratory compensation.   Imaging Studies ordered:  I ordered imaging studies including CXR  I independently visualized and interpreted imaging which showed possible pneumonia in bilateral lower lobes I agree with the radiologist interpretation   Cardiac Monitoring:  The patient was maintained on a cardiac monitor.  I personally viewed and interpreted the cardiac monitored which showed an underlying rhythm of: Sinus tachycardia   Medicines ordered and prescription drug management:  I ordered medication including broad-spectrum antibiotics and IV fluids for sepsis as well as IV insulin drip for DKA  Critical Interventions:  Code sepsis initiated and patient given 30 cc/kg fluid bolus and broad-spectrum antibiotics DKA protocol initiated with IV insulin drip   ED Course:  Patient is critically ill with presentation concerning for both sepsis and DKA, family was unaware of fever until patient arrived today but has been having issues controlling her blood sugar with worsening symptoms associated with this over the past 3 days.  Patient family has been trying to get her blood sugar under better control.  Increasing confusion and worsening mental status today prompting ED presentation. On arrival patient febrile and tachycardic, meeting SIRS criteria and sepsis protocol initiated without clear source based on history broad-spectrum antibiotics ordered.  Patient also has noted ketones on  her breath, blood sugar is improved to 81 compared to reported readings at home likely due to Humalog that was given prior to arrival but I still have clinical concern for DKA. Patient given fluid bolus, remains normotensive.  Very dry on exam. Initial labs clotted, initial metabolic panel without anion gap but with hyperkalemia and AKI, but these labs do not make sense with the [place] goal scenario will recollect nonclotting sample On recollection patient has calculated anion gap of 17 on i-STAT Chem-8, and has mild acidosis with pH of 7.26 on VBG with bicarb of 9, and respiratory compensation.  Started on insulin drip for DKA Additional lab work is still pending after recollection, but patient will require admission, likely to stepdown unit.  At shift change care signed out to PA [facility] who will follow up on pending labs and admit patient.        Final Clinical Impression(s) / ED Diagnoses Final diagnoses:  Sepsis, due to unspecified organism, unspecified whether acute organ dysfunction present [facility])  Diabetic ketoacidosis without coma associated with type 2 diabetes mellitus ([place])    Rx / DC Orders ED Discharge Orders     None         Marijean Bravo,  Audery Amel, PA-C 08/04/21 1613    Daleen Bo, MD 08/04/21 [phone]

## 2021-08-04 NOTE — ED Notes (Signed)
Attending Shalhoub MD notified of pt repeat temp of 102.4

## 2021-08-04 NOTE — ED Notes (Signed)
This RN unable to obtain second IV access, ED provider notified, IV team consult placed.

## 2021-08-04 NOTE — Assessment & Plan Note (Signed)
.   Patient presenting with multiple SIRS criteria and evidence of concurrent organ dysfunction including acute kidney injury lactic acidosis and metabolic encephalopathy  . Chest x-ray reveals no definitive evidence of infection . Urinalysis unremarkable . Obtaining COVID-19 PCR testing, CT imaging of the chest abdomen and pelvis . Obtaining procalcitonin and CRP . Aggressive intravenous volume resuscitation . Considering severity of illness, continue broad-spectrum intravenous antibiotic therapy for now, will discontinue if cultures remain negative for 48 hours. . Close clinical monitoring as patient is at high risk of rapid clinical decompensation

## 2021-08-04 NOTE — Assessment & Plan Note (Signed)
   Remarkable drop in hemoglobin to 8.1 from 12.5 approximately 2 weeks ago  No clinical evidence of bleeding  Stool Hemoccult negative  Bilirubin slightly elevated being about concern for possible hemolytic process  PT and PTT unremarkable  Obtaining pathology reviewed peripheral smear, LDH, haptoglobin and serial hepatic function test  Monitoring hemoglobin and hematocrit with serial CBCs  Holding any anticoagulant for now  Intravenous Protonix for now  Day team to consult hematology or gastroenterology in the morning based on results

## 2021-08-04 NOTE — ED Triage Notes (Signed)
Patient BIB son, reports hyperglycemia x2 weeks with CBG >500 and worsening weakness x3 days. Seen at PCP yesterday. Febrile during triage.

## 2021-08-04 NOTE — ED Provider Notes (Incomplete Revision)
  Face-to-face evaluation   History: He is here for evaluation of confusion, weakness, lethargy and hyperglycemia.  Patient brought in by her son, who was worried about her being more confused than usual  Physical exam: Elderly, sleepy but communicative.  No dysarthria or aphasia.  No respiratory distress.  She is confused.  MDM: Evaluation for  Chief Complaint  Patient presents with   Hyperglycemia   Fever     Patient presenting for evaluation hyperglycemia, with tachycardia and hypertension.  Blood sugars been running high and she is receiving extra short acting insulin.  According to her son she has not been taking her long-acting insulin for 1 month.  It is not clear why that is the case.  Findings indicate dehydration, lactic acidosis, hyperkalemia, hyperglycemia, low CO2 and elevated creatinine.  Suspect hyperosmolar state.  She will require admission for stabilization and treatment, likely in stepdown unit.  The patient is noted to have a lactate>4. With the current information available to me, I don't think the patient is in septic shock. The lactate>4, is related to diabetic ketoacidosis versus hyperosmolar state.  No hypotension in the ED.   Medical screening examination/treatment/procedure(s) were conducted as a shared visit with non-physician practitioner(s) and myself.  I personally evaluated the patient during the encounter

## 2021-08-04 NOTE — Progress Notes (Signed)
Pharmacy Antibiotic Note  [name] is a 71 y.o. female admitted on 08/04/2021  for evaluation of confusion, weakness, lethargy and hyperglycemia.  Pharmacy has been consulted to dose vancomycin and cefepime for sepsis. 1st doses given in the ED  Plan: Vancomycin '1500mg'$  IV q36h (AUC 528.2, Scr 1.28) Cefepime 2 gm IV q12h Flagyl '500mg'$  IV q12h per MD Follow renal function ,cultures and clinical course  Height: '5\' 4"'$  (162.6 cm) Weight: 78 kg (172 lb) IBW/kg (Calculated) : 54.7  Temp (24hrs), Avg:101.1 F (38.4 C), Min:100 F (37.8 C), Max:103 F (39.4 C)  Recent Labs  Lab 08/04/21 1315 08/04/21 1445 08/04/21 1515 08/04/21 1517 08/04/21 1537 08/04/21 1701  WBC  --   --   --  7.0  --   --   CREATININE 1.63* 0.80  --   --  0.87 1.28*  LATICACIDVEN 4.3*  --  6.9*  --   --  1.2    Estimated Creatinine Clearance: 41.3 mL/min (A) (by C-G formula based on SCr of 1.28 mg/dL (H)).    No Known Allergies  Antimicrobials this admission: 6/17 vanc >> 6/17 cefepime >> 6/17 flagyl >>  Dose adjustments this admission:   Microbiology results: 6/17 BCx:  6/17 UCx:   Thank you for allowing pharmacy to be a part of this patient's care.  Dolly Rias RPh 08/04/2021, 9:34 PM

## 2021-08-04 NOTE — Assessment & Plan Note (Signed)
   Intravenous PPI for now as noted above

## 2021-08-04 NOTE — ED Provider Notes (Addendum)
  Face-to-face evaluation   History: He is here for evaluation of confusion, weakness, lethargy and hyperglycemia.  Patient brought in by her son, who was worried about her being more confused than usual  Physical exam: Elderly, sleepy but communicative.  No dysarthria or aphasia.  No respiratory distress.  She is confused.  MDM: Evaluation for  Chief Complaint  Patient presents with   Hyperglycemia   Fever     Patient presenting for evaluation hyperglycemia, with tachycardia and hypertension.  Blood sugars been running high and she is receiving extra short acting insulin.  According to her son she has not been taking her long-acting insulin for 1 month.  It is not clear why that is the case.  Findings indicate dehydration, lactic acidosis, hyperkalemia, hyperglycemia, low CO2 and elevated creatinine.  Suspect hyperosmolar state.  She will require admission for stabilization and treatment, likely in stepdown unit.  The patient is noted to have a lactate>4. With the current information available to me, I don't think the patient is in septic shock. The lactate>4, is related to diabetic ketoacidosis versus hyperosmolar state.  No hypotension in the ED.   Medical screening examination/treatment/procedure(s) were conducted as a shared visit with non-physician practitioner(s) and myself.  I personally evaluated the patient during the encounter       Daleen Bo, MD 08/05/21 [phone]

## 2021-08-04 NOTE — Assessment & Plan Note (Signed)
.   Patient exhibiting evidence of acute kidney injury secondary to significant volume depletion . Creatinine is currently 1.63 an increase compared to baseline of 1.18 suggestive of mild acute kidney injury . Hydrating patient with intravenous isotonic fluids. . Strict input and output monitoring . Monitoring renal function and electrolytes with serial chemistries . Avoiding nephrotoxic agents if at all possible

## 2021-08-05 DIAGNOSIS — D509 Iron deficiency anemia, unspecified: Secondary | ICD-10-CM | POA: Diagnosis present

## 2021-08-05 DIAGNOSIS — K219 Gastro-esophageal reflux disease without esophagitis: Secondary | ICD-10-CM | POA: Diagnosis present

## 2021-08-05 DIAGNOSIS — Z79899 Other long term (current) drug therapy: Secondary | ICD-10-CM | POA: Diagnosis not present

## 2021-08-05 DIAGNOSIS — E039 Hypothyroidism, unspecified: Secondary | ICD-10-CM | POA: Diagnosis present

## 2021-08-05 DIAGNOSIS — J690 Pneumonitis due to inhalation of food and vomit: Secondary | ICD-10-CM | POA: Diagnosis present

## 2021-08-05 DIAGNOSIS — Z833 Family history of diabetes mellitus: Secondary | ICD-10-CM | POA: Diagnosis not present

## 2021-08-05 DIAGNOSIS — D6851 Activated protein C resistance: Secondary | ICD-10-CM | POA: Diagnosis present

## 2021-08-05 DIAGNOSIS — I252 Old myocardial infarction: Secondary | ICD-10-CM | POA: Diagnosis not present

## 2021-08-05 DIAGNOSIS — N179 Acute kidney failure, unspecified: Secondary | ICD-10-CM | POA: Diagnosis present

## 2021-08-05 DIAGNOSIS — A419 Sepsis, unspecified organism: Secondary | ICD-10-CM | POA: Diagnosis present

## 2021-08-05 DIAGNOSIS — Z794 Long term (current) use of insulin: Secondary | ICD-10-CM | POA: Diagnosis not present

## 2021-08-05 DIAGNOSIS — N1831 Chronic kidney disease, stage 3a: Secondary | ICD-10-CM | POA: Diagnosis present

## 2021-08-05 DIAGNOSIS — G9341 Metabolic encephalopathy: Secondary | ICD-10-CM | POA: Diagnosis present

## 2021-08-05 DIAGNOSIS — Z79890 Hormone replacement therapy (postmenopausal): Secondary | ICD-10-CM | POA: Diagnosis not present

## 2021-08-05 DIAGNOSIS — Z7982 Long term (current) use of aspirin: Secondary | ICD-10-CM | POA: Diagnosis not present

## 2021-08-05 DIAGNOSIS — E1110 Type 2 diabetes mellitus with ketoacidosis without coma: Secondary | ICD-10-CM | POA: Diagnosis present

## 2021-08-05 DIAGNOSIS — Z8249 Family history of ischemic heart disease and other diseases of the circulatory system: Secondary | ICD-10-CM | POA: Diagnosis not present

## 2021-08-05 DIAGNOSIS — Z91199 Patient's noncompliance with other medical treatment and regimen due to unspecified reason: Secondary | ICD-10-CM | POA: Diagnosis not present

## 2021-08-05 DIAGNOSIS — I129 Hypertensive chronic kidney disease with stage 1 through stage 4 chronic kidney disease, or unspecified chronic kidney disease: Secondary | ICD-10-CM | POA: Diagnosis present

## 2021-08-05 DIAGNOSIS — E669 Obesity, unspecified: Secondary | ICD-10-CM | POA: Diagnosis present

## 2021-08-05 DIAGNOSIS — Z801 Family history of malignant neoplasm of trachea, bronchus and lung: Secondary | ICD-10-CM | POA: Diagnosis not present

## 2021-08-05 DIAGNOSIS — Z20822 Contact with and (suspected) exposure to covid-19: Secondary | ICD-10-CM | POA: Diagnosis present

## 2021-08-05 DIAGNOSIS — E11649 Type 2 diabetes mellitus with hypoglycemia without coma: Secondary | ICD-10-CM | POA: Diagnosis not present

## 2021-08-05 DIAGNOSIS — E1122 Type 2 diabetes mellitus with diabetic chronic kidney disease: Secondary | ICD-10-CM | POA: Diagnosis present

## 2021-08-05 LAB — CBC WITH DIFFERENTIAL/PLATELET
Abs Immature Granulocytes: 0.11 10*3/uL — ABNORMAL HIGH (ref 0.00–0.07)
Basophils Absolute: 0.1 10*3/uL (ref 0.0–0.1)
Basophils Relative: 0 %
Eosinophils Absolute: 0 10*3/uL (ref 0.0–0.5)
Eosinophils Relative: 0 %
HCT: 34 % — ABNORMAL LOW (ref 36.0–46.0)
Hemoglobin: 11.3 g/dL — ABNORMAL LOW (ref 12.0–15.0)
Immature Granulocytes: 1 %
Lymphocytes Relative: 10 %
Lymphs Abs: 1.3 10*3/uL (ref 0.7–4.0)
MCH: 28 pg (ref 26.0–34.0)
MCHC: 33.2 g/dL (ref 30.0–36.0)
MCV: 84.4 fL (ref 80.0–100.0)
Monocytes Absolute: 2.5 10*3/uL — ABNORMAL HIGH (ref 0.1–1.0)
Monocytes Relative: 20 %
Neutro Abs: 8.6 10*3/uL — ABNORMAL HIGH (ref 1.7–7.7)
Neutrophils Relative %: 69 %
Platelets: 287 10*3/uL (ref 150–400)
RBC: 4.03 MIL/uL (ref 3.87–5.11)
RDW: 14.3 % (ref 11.5–15.5)
WBC: 12.6 10*3/uL — ABNORMAL HIGH (ref 4.0–10.5)
nRBC: 0 % (ref 0.0–0.2)

## 2021-08-05 LAB — GLUCOSE, CAPILLARY
Glucose-Capillary: 137 mg/dL — ABNORMAL HIGH (ref 70–99)
Glucose-Capillary: 150 mg/dL — ABNORMAL HIGH (ref 70–99)
Glucose-Capillary: 116 mg/dL — ABNORMAL HIGH (ref 70–99)
Glucose-Capillary: 245 mg/dL — ABNORMAL HIGH (ref 70–99)
Glucose-Capillary: 211 mg/dL — ABNORMAL HIGH (ref 70–99)
Glucose-Capillary: 202 mg/dL — ABNORMAL HIGH (ref 70–99)
Glucose-Capillary: 146 mg/dL — ABNORMAL HIGH (ref 70–99)
Glucose-Capillary: 202 mg/dL — ABNORMAL HIGH (ref 70–99)
Glucose-Capillary: 209 mg/dL — ABNORMAL HIGH (ref 70–99)
Glucose-Capillary: 182 mg/dL — ABNORMAL HIGH (ref 70–99)
Glucose-Capillary: 161 mg/dL — ABNORMAL HIGH (ref 70–99)
Glucose-Capillary: 149 mg/dL — ABNORMAL HIGH (ref 70–99)
Glucose-Capillary: 121 mg/dL — ABNORMAL HIGH (ref 70–99)
Glucose-Capillary: 183 mg/dL — ABNORMAL HIGH (ref 70–99)
Glucose-Capillary: 64 mg/dL — ABNORMAL LOW (ref 70–99)
Glucose-Capillary: 128 mg/dL — ABNORMAL HIGH (ref 70–99)

## 2021-08-05 LAB — COMPREHENSIVE METABOLIC PANEL
ALT: 14 U/L (ref 0–44)
AST: 34 U/L (ref 15–41)
Albumin: 2.9 g/dL — ABNORMAL LOW (ref 3.5–5.0)
Alkaline Phosphatase: 66 U/L (ref 38–126)
Anion gap: 12 (ref 5–15)
BUN: 10 mg/dL (ref 8–23)
CO2: 16 mmol/L — ABNORMAL LOW (ref 22–32)
Calcium: 8.8 mg/dL — ABNORMAL LOW (ref 8.9–10.3)
Chloride: 105 mmol/L (ref 98–111)
Creatinine, Ser: 1.21 mg/dL — ABNORMAL HIGH (ref 0.44–1.00)
GFR, Estimated: 48 mL/min — ABNORMAL LOW (ref >60)
Glucose, Bld: 112 mg/dL — ABNORMAL HIGH (ref 70–99)
Potassium: 3.9 mmol/L (ref 3.5–5.1)
Sodium: 133 mmol/L — ABNORMAL LOW (ref 135–145)
Total Bilirubin: 0.9 mg/dL (ref 0.3–1.2)
Total Protein: 6.9 g/dL (ref 6.5–8.1)

## 2021-08-05 LAB — TSH: TSH: 12.635 u[IU]/mL — ABNORMAL HIGH (ref 0.350–4.500)

## 2021-08-05 LAB — OSMOLALITY: Osmolality: 273 mOsm/kg — ABNORMAL LOW (ref 275–295)

## 2021-08-05 LAB — TYPE AND SCREEN
ABO/RH(D): A POS
Antibody Screen: NEGATIVE

## 2021-08-05 LAB — MAGNESIUM: Magnesium: 1.8 mg/dL (ref 1.7–2.4)

## 2021-08-05 LAB — CBG MONITORING, ED: Glucose-Capillary: 136 mg/dL — ABNORMAL HIGH (ref 70–99)

## 2021-08-05 LAB — AMMONIA: Ammonia: 19 umol/L (ref 9–35)

## 2021-08-05 LAB — HEMOGLOBIN A1C
Hgb A1c MFr Bld: 11.1 % — ABNORMAL HIGH (ref 4.8–5.6)
Mean Plasma Glucose: 271.87 mg/dL

## 2021-08-05 LAB — ABO/RH: ABO/RH(D): A POS

## 2021-08-05 LAB — BETA-HYDROXYBUTYRIC ACID: Beta-Hydroxybutyric Acid: 0.3 mmol/L — ABNORMAL HIGH (ref 0.05–0.27)

## 2021-08-05 LAB — HIV ANTIBODY (ROUTINE TESTING W REFLEX): HIV Screen 4th Generation wRfx: NONREACTIVE

## 2021-08-05 LAB — MRSA NEXT GEN BY PCR, NASAL: MRSA by PCR Next Gen: NOT DETECTED

## 2021-08-05 MED ORDER — INSULIN GLARGINE-YFGN 100 UNIT/ML ~~LOC~~ SOLN
30.0000 [IU] | SUBCUTANEOUS | Status: DC
Start: 2021-08-05 — End: 2021-08-05
  Filled 2021-08-05 (×2): qty 0.3

## 2021-08-05 MED ORDER — INSULIN GLARGINE-YFGN 100 UNIT/ML ~~LOC~~ SOLN
35.0000 [IU] | SUBCUTANEOUS | Status: DC
  Administered 2021-08-05: 35 [IU] via SUBCUTANEOUS
  Filled 2021-08-05 (×2): qty 0.35

## 2021-08-05 MED ORDER — INSULIN ASPART 100 UNIT/ML IJ SOLN
3.0000 [IU] | Freq: Three times a day (TID) | INTRAMUSCULAR | Status: DC
Start: 2021-08-05 — End: 2021-08-07
  Administered 2021-08-05 – 2021-08-06 (×3): 3 [IU] via SUBCUTANEOUS

## 2021-08-05 MED ORDER — HYDRALAZINE HCL 50 MG PO TABS: 50.0000 mg | ORAL_TABLET | Freq: Three times a day (TID) | ORAL | Status: DC

## 2021-08-05 MED ORDER — ORAL CARE MOUTH RINSE: 15.0000 mL | OROMUCOSAL | Status: DC | PRN

## 2021-08-05 MED ORDER — LACTATED RINGERS IV SOLN: INTRAVENOUS | Status: DC

## 2021-08-05 MED ORDER — MAGNESIUM SULFATE 4 GM/100ML IV SOLN
4.0000 g | Freq: Once | INTRAVENOUS | Status: AC
  Administered 2021-08-05: 4 g via INTRAVENOUS
  Filled 2021-08-05: qty 100

## 2021-08-05 MED ORDER — INSULIN ASPART 100 UNIT/ML IJ SOLN
0.0000 [IU] | Freq: Three times a day (TID) | INTRAMUSCULAR | Status: DC
  Administered 2021-08-05: 3 [IU] via SUBCUTANEOUS
  Administered 2021-08-05 – 2021-08-06 (×2): 2 [IU] via SUBCUTANEOUS
  Administered 2021-08-07: 5 [IU] via SUBCUTANEOUS

## 2021-08-05 MED ORDER — LIVING WELL WITH DIABETES BOOK
Freq: Once | Status: AC
  Administered 2021-08-05: 1
  Filled 2021-08-05: qty 1

## 2021-08-05 MED ORDER — OXYCODONE HCL 5 MG PO TABS
5.0000 mg | ORAL_TABLET | ORAL | Status: DC | PRN
  Administered 2021-08-06: 5 mg via ORAL
  Filled 2021-08-05: qty 1

## 2021-08-05 MED ORDER — LABETALOL HCL 5 MG/ML IV SOLN
10.0000 mg | Freq: Once | INTRAVENOUS | Status: AC
  Administered 2021-08-05: 10 mg via INTRAVENOUS
  Filled 2021-08-05: qty 4

## 2021-08-05 MED ORDER — INSULIN ASPART 100 UNIT/ML IJ SOLN: 0.0000 [IU] | INTRAMUSCULAR | Status: DC

## 2021-08-05 MED ORDER — PIPERACILLIN-TAZOBACTAM 3.375 G IVPB
3.3750 g | Freq: Three times a day (TID) | INTRAVENOUS | Status: DC
Start: 2021-08-05 — End: 2021-08-07
  Administered 2021-08-05 – 2021-08-07 (×6): 3.375 g via INTRAVENOUS
  Filled 2021-08-05 (×6): qty 50

## 2021-08-05 MED ORDER — INSULIN ASPART 100 UNIT/ML IJ SOLN: 0.0000 [IU] | Freq: Every day | INTRAMUSCULAR | Status: DC

## 2021-08-05 MED ORDER — CHLORHEXIDINE GLUCONATE CLOTH 2 % EX PADS
6.0000 | MEDICATED_PAD | Freq: Every day | CUTANEOUS | Status: DC
  Administered 2021-08-05 – 2021-08-07 (×4): 6 via TOPICAL

## 2021-08-05 MED ORDER — MORPHINE SULFATE (PF) 2 MG/ML IV SOLN: 2.0000 mg | INTRAVENOUS | Status: DC | PRN

## 2021-08-05 MED ORDER — AMLODIPINE BESYLATE 5 MG PO TABS
5.0000 mg | ORAL_TABLET | Freq: Every day | ORAL | Status: DC
  Administered 2021-08-05 – 2021-08-07 (×3): 5 mg via ORAL
  Filled 2021-08-05 (×3): qty 1

## 2021-08-05 NOTE — Plan of Care (Signed)

## 2021-08-05 NOTE — Inpatient Diabetes Management (Signed)
Inpatient Diabetes Program Recommendations  AACE/ADA: New Consensus Statement on Inpatient Glycemic Control (2015)  Target Ranges:  Prepandial:   less than 140 mg/dL      Peak postprandial:   less than 180 mg/dL (1-2 hours)      Critically ill patients:  140 - 180 mg/dL   Lab Results  Component Value Date   GLUCAP 149 (H) 08/05/2021   HGBA1C 11.1 (H) 08/05/2021    Latest Reference Range & Units 08/05/21 02:59  Potassium 3.5 - 5.1 mmol/L 3.9  Chloride 98 - 111 mmol/L 105  CO2 22 - 32 mmol/L 16 (L)  Glucose 70 - 99 mg/dL 112 (H)  Mean Plasma Glucose mg/dL 271.87  BUN 8 - 23 mg/dL 10  Creatinine 0.44 - 1.00 mg/dL 1.21 (H)  Calcium 8.9 - 10.3 mg/dL 8.8 (L)  Anion gap 5 - 15  12    Latest Reference Range & Units 08/05/21 02:59  Beta-Hydroxybutyric Acid 0.05 - 0.27 mmol/L 0.30 (H)  Glucose 70 - 99 mg/dL 112 (H)  Hemoglobin A1C 4.8 - 5.6 % 11.1 (H)  (H): Data is abnormally high  Review of Glycemic Control  Diabetes history: DM2 Outpatient Diabetes medications: Lantus 35 units (not taken for ? days Current orders for Inpatient glycemic control: Semglee 35 units, Novolog 3 units tid meal coverage, Novolog 0-15 units correction + hs 0-5 units  Inpatient Diabetes Program Recommendations:   Noted per note patient has not been taking her Lantus insulin. Will plan to speak with pt. In am. Ordered Living Well With diabetes booklet for patient review.  Thank you, Nani Gasser. Madge Therrien, RN, MSN, CDE  Diabetes Coordinator Inpatient Glycemic Control Team Team Pager [phone] (8am-5pm) 08/05/2021 1:38 PM

## 2021-08-05 NOTE — Progress Notes (Signed)
Hypoglycemic Event  CBG: 64   Treatment: 4 oz juice/soda  Symptoms: None  Follow-up CBG: Time: 2137 CBG Result:128   Possible Reasons for Event: Inadequate meal intake    [name]

## 2021-08-05 NOTE — Progress Notes (Signed)
Long acting insulin was given at 1235 per Dr Serita Grit orders. Insulin gtt and associated fluids were turned off at 1503. Per Dr Posey Pronto, BMP and beta h results were not needed prior to discontinuation of insulin gtt and endo tool. This RN will continue to carefully monitor CBGs.

## 2021-08-05 NOTE — ED Notes (Signed)
Pt has ready bed for ICU bed 1222, attempted to call ICU, secretary advised RN Dola Argyle will need to call back

## 2021-08-05 NOTE — ED Notes (Signed)
This RN to transfer pt to ICU bed 1222, Miguel Rota, RN receiving RN has agreed to resume care once pt has arrived to their unit. Tele monitor in place during transport.

## 2021-08-05 NOTE — Hospital Course (Signed)
Past medical history of type II DM, factor V Leyden deficiency, nonocclusive CAD, OSA, hypothyroidism, HTN, GERD presents with complaints of high sugars, and confusion and sepsis secondary to aspiration pneumonia.

## 2021-08-05 NOTE — Evaluation (Signed)
Clinical/Bedside Swallow Evaluation Patient Details  Name: [name] MRN: [id number] Date of Birth: 1950/12/20  Today's Date: 08/05/2021 Time: SLP Start Time (ACUTE ONLY): 0930 SLP Stop Time (ACUTE ONLY): 9381 SLP Time Calculation (min) (ACUTE ONLY): 20 min  Past Medical History:  Past Medical History:  Diagnosis Date   Clotting disorder ([place])    Constipation    history of   Diabetes mellitus without complication ([place])    DVT (deep venous thrombosis) ([place])    2007   Factor V Leiden ([place])    deficiency    GERD (gastroesophageal reflux disease)    Hypertension    Hypothyroidism    Kidney disease    early stage   NSTEMI (non-ST elevated myocardial infarction) ([place])    2011 - possibly r/t DVT/embolization?    Sleep apnea    no c-pap   Past Surgical History:  Past Surgical History:  Procedure Laterality Date   CARDIAC CATHETERIZATION  10/21/2009   mild bridging(?) segment in mid LAD, otherwise normal coronaries (Dr. Alma Friendly)   [place]     when 6 months old   LOWER EXTREMITY ARTERIAL DOPPLER  2011   normal LEA   SLEEP STUDY  2011   AHI 8.5 and REM AHI 18   TRANSTHORACIC ECHOCARDIOGRAM  2011   EF 55-60%, trivial MR, TR, pulm valve regurg   TUBAL LIGATION  1995   UPPER GASTROINTESTINAL ENDOSCOPY     HPI:  Patient is a 71 y.o. female with PMH: V Leiden (not on anticoagulant), prior DVT (2007), NSTEMI 2011, insulin-dependent DM-2, HTN, hypothyroidism, OSA (not on CPAP), GERD. She presented to [facility] ED with her son due to progressively worsening weakness and severe hypoglycemia. In ED, son reported that patient has not been taking her Lantus over the span of several weeks and has had blood sugars as high as 400-500. Per patient, she stated she stopped taking Lantus because "it was not doing anything". In ED she was febrile at 103 degrees F with additional SIRS criteria including tachycardia and tachypnea; she was hyperglycemic and anemic. Current  diagnosis is diabetic ketoacidosis with concerns for underlying sepsis and encephalopathy. She was coughing a lot (mainly clear secretions) in AM on 08/05/21, prompting SLP evaluation of swallow function.    Assessment / Plan / Recommendation  Clinical Impression  Patient currently presenting with mildly impaired oral phase of swallow which appears due to lack of dentition and dentures not currently present. (patient says her son can bring them from home). No overt s/s aspiration or penetration observed with multiple consecutive straw sips of thin liquids (water) or with solids (saltine cracker). No change in vocal quality or vitals. Patient denies any significant GERD symptoms now or in recent past and states that she takes a PPI at home. Although patient was somewhat drowsy, she was able to feed self liquids and solids when set in front of her without difficulties. SLP is recommending to initiate regular solids, thin liquids diet at this time. No f/u needed but please reorder if patient exhibiting any further s/s of dysphagia.      Aspiration Risk  No limitations    Diet Recommendation Regular;Thin liquid   Liquid Administration via: Straw;Cup Medication Administration: Whole meds with liquid Supervision: Patient able to self feed Compensations: Slow rate;Small sips/bites Postural Changes: Seated upright at 90 degrees;Remain upright for at least 30 minutes after po intake    Other  Recommendations Oral Care Recommendations: Oral care BID  Recommendations for follow up therapy are one component of a multi-disciplinary discharge planning process, led by the attending physician.  Recommendations may be updated based on patient status, additional functional criteria and insurance authorization.  Follow up Recommendations No SLP follow up      Assistance Recommended at Discharge None  Functional Status Assessment Patient has had a recent decline in their functional status and demonstrates  the ability to make significant improvements in function in a reasonable and predictable amount of time.  Frequency and Duration   N/A         Prognosis   N/A     Swallow Study   General Date of Onset: 08/04/21 HPI: Patient is a 71 y.o. female with PMH: V Leiden (not on anticoagulant), prior DVT (2007), NSTEMI 2011, insulin-dependent DM-2, HTN, hypothyroidism, OSA (not on CPAP), GERD. She presented to [facility] ED with her son due to progressively worsening weakness and severe hypoglycemia. In ED, son reported that patient has not been taking her Lantus over the span of several weeks and has had blood sugars as high as 400-500. Per patient, she stated she stopped taking Lantus because "it was not doing anything". In ED she was febrile at 103 degrees F with additional SIRS criteria including tachycardia and tachypnea; she was hyperglycemic and anemic. Current diagnosis is diabetic ketoacidosis with concerns for underlying sepsis and encephalopathy. She was coughing a lot (mainly clear secretions) in AM on 08/05/21, prompting SLP evaluation of swallow function. Type of Study: Bedside Swallow Evaluation Previous Swallow Assessment: none found Diet Prior to this Study: NPO Temperature Spikes Noted: Yes (100.8) Respiratory Status: Room air History of Recent Intubation: No Behavior/Cognition: Alert;Cooperative;Pleasant mood;Lethargic/Drowsy Oral Cavity Assessment: Within Functional Limits Oral Care Completed by SLP: No Oral Cavity - Dentition: Edentulous;Dentures, not available;Other (Comment) (patient said her son can bring her dentures) Vision: Functional for self-feeding Self-Feeding Abilities: Able to feed self Patient Positioning: Upright in bed Baseline Vocal Quality: Normal Volitional Cough: Strong Volitional Swallow: Able to elicit    Oral/Motor/Sensory Function Overall Oral Motor/Sensory Function: Within functional limits   Ice Chips     Thin Liquid Thin Liquid: Within functional  limits Presentation: Straw;Self Fed    Nectar Thick     Honey Thick     Puree Puree: Not tested   Solid     Solid: Impaired Oral Phase Impairments: Impaired mastication Other Comments: mildly impaired but functional mastication apppears due to patient not having dentures present     Sonia Baller, MA, CCC-SLP Speech Therapy

## 2021-08-05 NOTE — Progress Notes (Signed)
Pharmacy Antibiotic Note  [name] is a 71 y.o. female admitted on 08/04/2021  for evaluation of confusion, weakness, lethargy and hyperglycemia.  Pharmacy initially consulted to dose vancomycin and cefepime for sepsis. Now to transition to Zosyn for aspiration pneumonia.  Plan: Zosyn 3.375 g IV q8h extended infusion  Pharmacy to sign off consult but will continue to monitor renal function, cultures and clinical progress for dose adjustments and de-escalation as indicated.  Height: '5\' 4"'$  (162.6 cm) Weight: 80.8 kg (178 lb 2.1 oz) IBW/kg (Calculated) : 54.7  Temp (24hrs), Avg:101.3 F (38.5 C), Min:99.9 F (37.7 C), Max:103.2 F (39.6 C)  Recent Labs  Lab 08/04/21 1315 08/04/21 1445 08/04/21 1515 08/04/21 1517 08/04/21 1537 08/04/21 1701 08/04/21 2044 08/05/21 0259  WBC  --   --   --  7.0  --   --  9.0 12.6*  CREATININE 1.63* 0.80  --   --  0.87 1.28* 1.24* 1.21*  LATICACIDVEN 4.3*  --  6.9*  --   --  1.2  --   --      Estimated Creatinine Clearance: 44.5 mL/min (A) (by C-G formula based on SCr of 1.21 mg/dL (H)).    No Known Allergies  Antimicrobials this admission: 6/18 zosyn >> 6/17 vanc >> 6/18 6/17 cefepime >> 6/18 6/17 flagyl >> 6/18   Microbiology results: 6/17 BCx:  6/17 UCx:  6/18 MRSA PCR negative  Thank you for allowing pharmacy to be a part of this patient's care.  Tawnya Crook, PharmD, BCPS Clinical Pharmacist 08/05/2021 9:32 AM

## 2021-08-05 NOTE — Progress Notes (Signed)
  Progress Note Patient: [name] WFU:[id number] DOB: 05-Sep-1950 DOA: 08/04/2021  DOS: the patient was seen and examined on 08/05/2021  Brief hospital course: Past medical history of type II DM, factor V Leyden deficiency, nonocclusive CAD, OSA, hypothyroidism, HTN, GERD presents with complaints of high sugars, and confusion and sepsis secondary to aspiration pneumonia. Assessment and Plan: Aspiration pneumonia. Sepsis present on admission. Present with confusion found to have fever, tachycardia, tachypnea X-ray and CT scan shows evidence of right sided pneumonia. Blood cultures currently pending.  MRSA PCR negative. Currently on IV Zosyn. Discontinue vancomycin. Continue with IV fluids gently. Lactic acid level was 6.9 unsure of such high level. Suspect lactic acidosis is actually responsible for anion gap.  DKA. Type 2 diabetes mellitus, uncontrolled with hyperglycemia with long-term insulin use with CKD 3A. Started on IV insulin drip. Anion gap actually closed on. Metabolic acidosis also appears to have improved. I suspect that the DKA was not a significant finding.  Most likely the anion gap is secondary to lactic acidosis. We will continue to aggressively hydrate the patient. Continue with basal bolus regimen.  Concern for dysphagia. Appreciate speech therapy consultation. Patient is currently on regular diet.  Acute kidney injury on stage IIIa chronic kidney disease. Baseline serum creatinine 0.8. Currently serum creatinine 1.2. We will continue to monitor continue with IV fluids.  HTN. Blood pressure rather elevated. Norvasc added. Monitor.  GERD. Anemia. Currently on PPI. Will monitor. Monitor H&H as well. Iron deficiency seen on blood work. We will initiate oral iron. No acute bleeding reported by the patient.  Hypothyroidism. TSH is actually rather elevated. Continue Synthroid for now.  Abdominal pain. Lactic acidosis. CT abdomen unremarkable. No  evidence of colitis or ischemia. Monitor.  Obesity. Body mass index is 30.58 kg/m.  Placing the pt at higher risk of poor outcomes.  Subjective: No nausea no vomiting no fever no chills.  Abdominal pain resolved.  Complains of some headache.  Hungry.  Physical Exam: Vitals:   08/05/21 1800 08/05/21 1804 08/05/21 1900 08/05/21 2000  BP: (!) 188/78 (!) 188/78 (!) 177/69 (!) 170/66  Pulse: 99  (!) 106 (!) 105  Resp: (!) 29  (!) 23 (!) 24  Temp:   (!) 101.1 F (38.4 C)   TempSrc:   Oral   SpO2: 97%  99% 99%  Weight:      Height:       General: Appear in mild distress; no visible Abnormal Neck Mass Or lumps, Conjunctiva normal Cardiovascular: S1 and S2 Present, NO Murmur, Respiratory: good respiratory effort, Bilateral Air entry present and CTA, no Crackles, no wheezes Abdomen: Bowel Sound present, Non tender  Extremities: no Pedal edema Neurology: alert and oriented to time, place, and person  Gait not checked due to patient safety concerns   Data Reviewed: I have Reviewed nursing notes, Vitals, and Lab results since pt's last encounter. Pertinent lab results CBC and CMP and lactic acid level I have ordered test including CBC CMP and lactic acid I have independently visualized and interpreted imaging CT chest which showed right lower lobe aspiration pneumonia.   Family Communication: None at bedside  Disposition: Status is: Inpatient Remains inpatient appropriate because: Needing IV therapy, IV antibiotics follow-up on blood cultures.  Still febrile.  Author: Berle Mull, MD 08/05/2021 8:16 PM  Please look on [email or web address] to find out who is on call.

## 2021-08-06 ENCOUNTER — Other Ambulatory Visit: Payer: Self-pay

## 2021-08-06 DIAGNOSIS — Z794 Long term (current) use of insulin: Secondary | ICD-10-CM | POA: Diagnosis not present

## 2021-08-06 DIAGNOSIS — E1110 Type 2 diabetes mellitus with ketoacidosis without coma: Secondary | ICD-10-CM | POA: Diagnosis not present

## 2021-08-06 LAB — BASIC METABOLIC PANEL
Anion gap: 9 (ref 5–15)
BUN: 11 mg/dL (ref 8–23)
CO2: 21 mmol/L — ABNORMAL LOW (ref 22–32)
Calcium: 8.2 mg/dL — ABNORMAL LOW (ref 8.9–10.3)
Chloride: 105 mmol/L (ref 98–111)
Creatinine, Ser: 1.26 mg/dL — ABNORMAL HIGH (ref 0.44–1.00)
GFR, Estimated: 46 mL/min — ABNORMAL LOW (ref >60)
Glucose, Bld: 202 mg/dL — ABNORMAL HIGH (ref 70–99)
Potassium: 3.3 mmol/L — ABNORMAL LOW (ref 3.5–5.1)
Sodium: 135 mmol/L (ref 135–145)

## 2021-08-06 LAB — CBC
HCT: 33 % — ABNORMAL LOW (ref 36.0–46.0)
Hemoglobin: 11.1 g/dL — ABNORMAL LOW (ref 12.0–15.0)
MCH: 27.5 pg (ref 26.0–34.0)
MCHC: 33.6 g/dL (ref 30.0–36.0)
MCV: 81.9 fL (ref 80.0–100.0)
Platelets: 298 10*3/uL (ref 150–400)
RBC: 4.03 MIL/uL (ref 3.87–5.11)
RDW: 14 % (ref 11.5–15.5)
WBC: 8.1 10*3/uL (ref 4.0–10.5)
nRBC: 0 % (ref 0.0–0.2)

## 2021-08-06 LAB — URINE CULTURE: Culture: NO GROWTH

## 2021-08-06 LAB — GLUCOSE, CAPILLARY
Glucose-Capillary: 146 mg/dL — ABNORMAL HIGH (ref 70–99)
Glucose-Capillary: 92 mg/dL (ref 70–99)
Glucose-Capillary: 110 mg/dL — ABNORMAL HIGH (ref 70–99)
Glucose-Capillary: 160 mg/dL — ABNORMAL HIGH (ref 70–99)

## 2021-08-06 LAB — HAPTOGLOBIN: Haptoglobin: 397 mg/dL — ABNORMAL HIGH (ref 37–355)

## 2021-08-06 LAB — MAGNESIUM: Magnesium: 2.4 mg/dL (ref 1.7–2.4)

## 2021-08-06 LAB — C-REACTIVE PROTEIN: CRP: 5 mg/dL — ABNORMAL HIGH (ref <1.0)

## 2021-08-06 MED ORDER — INSULIN GLARGINE-YFGN 100 UNIT/ML ~~LOC~~ SOLN
30.0000 [IU] | Freq: Every day | SUBCUTANEOUS | Status: DC
  Administered 2021-08-06: 30 [IU] via SUBCUTANEOUS
  Filled 2021-08-06 (×2): qty 0.3

## 2021-08-06 MED ORDER — FERROUS SULFATE 325 (65 FE) MG PO TABS
325.0000 mg | ORAL_TABLET | Freq: Every day | ORAL | Status: DC
  Administered 2021-08-07: 325 mg via ORAL
  Filled 2021-08-06: qty 1

## 2021-08-06 MED ORDER — POTASSIUM CHLORIDE CRYS ER 20 MEQ PO TBCR
40.0000 meq | EXTENDED_RELEASE_TABLET | ORAL | Status: AC
  Administered 2021-08-06 (×2): 40 meq via ORAL
  Filled 2021-08-06 (×2): qty 2

## 2021-08-06 MED ORDER — SODIUM CHLORIDE 0.9 % IV SOLN: INTRAVENOUS | Status: DC | PRN

## 2021-08-06 NOTE — Progress Notes (Addendum)
  Progress Note Patient: [name] [id number]:[id number] DOB: [birth year] DOA: 08/04/2021  DOS: the patient was seen and examined on 08/06/2021  Brief hospital course: Past medical history of type II DM, factor V Leyden deficiency, nonocclusive CAD, OSA, hypothyroidism, HTN, GERD presents with complaints of high sugars, and confusion and sepsis secondary to aspiration pneumonia. Assessment and Plan: Aspiration pneumonia. Severe sepsis present on admission. Present with confusion found to have fever, tachycardia, tachypnea.  Evidence of endorgan damage with lactic acidosis and confusion. X-ray and CT scan shows evidence of right sided pneumonia. Blood cultures so far no growth. MRSA PCR negative. Currently on IV Zosyn.  Will transition to p.o. antibiotic tomorrow. Speech therapy consulted.  Currently on regular diet.  DKA. Type 2 diabetes mellitus, uncontrolled with hyperglycemia with long-term insulin use with CKD 3A. Started on IV insulin drip. Anion gap actually closed on. Metabolic acidosis also appears to have improved. I suspect that the DKA was not a significant finding.  Most likely the anion gap is secondary to lactic acidosis. Continue with basal bolus regimen.  Dose reduced due to episode of hypoglycemia yesterday.   Concern for dysphagia.  Ruled out. Appreciate speech therapy consultation. Patient is currently on regular diet.  Acute kidney injury on stage IIIa chronic kidney disease. Hypokalemia Baseline serum creatinine 0.8. Currently serum creatinine 1.2. Monitor. Potassium being replaced.  HTN. Blood pressure remains elevated. Norvasc added.  Losartan on hold due to AKI. Monitor.  GERD. Iron deficiency anemia. Currently on PPI. Monitor H&H as well.  Hemoglobin stable. We will initiate oral iron. No acute bleeding reported by the patient.  Hypothyroidism. TSH is actually rather elevated. Continue Synthroid for now.  Abdominal pain. Lactic acidosis. CT  abdomen unremarkable. No evidence of colitis or ischemia. Monitor.   Obesity. Body mass index is 30.73 kg/m.  Placing the pt at higher risk of poor outcomes.  Subjective: No nausea no vomiting.  Headache resolved.  No fever no chills.  Physical Exam: Vitals:   08/06/21 1211 08/06/21 1516 08/06/21 1554 08/06/21 1628  BP:    (!) 169/76  Pulse: (!) 106  100 (!) 102  Resp: (!) 26  15 (!) 25  Temp: 98.1 F (36.7 C) 99 F (37.2 C)    TempSrc: Oral Oral    SpO2: 100%  100% 99%  Weight:      Height:       General: Appear in mild distress; no visible Abnormal Neck Mass Or lumps, Conjunctiva normal Cardiovascular: S1 and S2 Present, no Murmur, Respiratory: good respiratory effort, Bilateral Air entry present and faint Crackles, no wheezes Abdomen: Bowel Sound present, Non tender Extremities: no Pedal edema Neurology: alert and oriented to time, place, and person  Gait not checked due to patient safety concerns   Data Reviewed: I have Reviewed nursing notes, Vitals, and Lab results since pt's last encounter. Pertinent lab results CBC and BMP I have ordered test including BMP and magnesium    Family Communication: None at bedside  Disposition: Status is: Inpatient Continuing IV antibiotics.  Awaiting culture clearance.  Author: Berle Mull, MD 08/06/2021 6:40 PM  Please look on [email or web address] to find out who is on call.

## 2021-08-06 NOTE — Progress Notes (Signed)
Inpatient Diabetes Program Recommendations  AACE/ADA: New Consensus Statement on Inpatient Glycemic Control (2015)  Target Ranges:  Prepandial:   less than 140 mg/dL      Peak postprandial:   less than 180 mg/dL (1-2 hours)      Critically ill patients:  140 - 180 mg/dL   Lab Results  Component Value Date   GLUCAP 92 08/06/2021   HGBA1C 11.1 (H) 08/05/2021    Review of Glycemic Control  Latest Reference Range & Units 08/05/21 17:59 08/05/21 21:13 08/05/21 21:37 08/06/21 07:34 08/06/21 11:57  Glucose-Capillary 70 - 99 mg/dL 183 (H) 64 (L) 128 (H) 146 (H) 92  (H): Data is abnormally high (L): Data is abnormally low Diabetes history: DM2 Outpatient Diabetes medications: Lantus 35 units QD, Humalog 5-10 units BID, Metformin 1000 mg BID Current orders for Inpatient glycemic control: Semglee 35 units, Novolog 3 units tid meal coverage, Novolog 0-15 units correction + hs 0-5 units   Inpatient Diabetes Program Recommendations:    Spoke with patient regarding outpatient diabetes management. Patient reports that she quit taking her Lantus because "it wasn't working". PCP recently increased the dose from 25 units to 35 units, however, "it just wasn't working so I figured I would take more Humalog". Reviewed patient's current A1c of 11.1%. Explained what a A1c is and what it measures. Also reviewed goal A1c with patient, importance of good glucose control @ home, and blood sugar goals. Reviewed patho of DM, need for consistent dosing, role of pancreas, differences between long acting vs short acting insulin, survival skills, impact of infection with poor glycemic control, vascular changes and commorbidities. Patient has a meter and testing supplies. Reminded when to call PCP when out of target range.  Admits to drinking sugary beverages occasionally. Reviewed alternatives and encouraged CHO mindfulness.  Stressed importance of taking medications as prescribed and to reach out with concerns. Son to  further assist patient. Patient has no further questions at this time.   Thanks, Bronson Curb, MSN, RNC-OB Diabetes Coordinator [phone] (8a-5p)

## 2021-08-06 NOTE — Progress Notes (Signed)
Transition of Care [facility]) Screening Note  Patient Details  Name: [name] Date of Birth: 1950-03-25  Transition of Care [facility]) CM/SW Contact:    Sherie Don, LCSW Phone Number: 08/06/2021, 10:27 AM  Transition of Care Department [facility]) has reviewed patient and no TOC needs have been identified at this time. We will continue to monitor patient advancement through interdisciplinary progression rounds. If new patient transition needs arise, please place a TOC consult.

## 2021-08-07 ENCOUNTER — Telehealth (HOSPITAL_BASED_OUTPATIENT_CLINIC_OR_DEPARTMENT_OTHER): Payer: Self-pay

## 2021-08-07 ENCOUNTER — Ambulatory Visit (INDEPENDENT_AMBULATORY_CARE_PROVIDER_SITE_OTHER): Payer: Medicare Other | Admitting: Podiatry

## 2021-08-07 DIAGNOSIS — Z9289 Personal history of other medical treatment: Secondary | ICD-10-CM

## 2021-08-07 DIAGNOSIS — E1110 Type 2 diabetes mellitus with ketoacidosis without coma: Secondary | ICD-10-CM | POA: Diagnosis not present

## 2021-08-07 DIAGNOSIS — Z794 Long term (current) use of insulin: Secondary | ICD-10-CM | POA: Diagnosis not present

## 2021-08-07 LAB — BLOOD CULTURE ID PANEL (REFLEXED) - BCID2

## 2021-08-07 LAB — BASIC METABOLIC PANEL
Anion gap: 9 (ref 5–15)
BUN: 9 mg/dL (ref 8–23)
CO2: 21 mmol/L — ABNORMAL LOW (ref 22–32)
Calcium: 8.4 mg/dL — ABNORMAL LOW (ref 8.9–10.3)
Chloride: 109 mmol/L (ref 98–111)
Creatinine, Ser: 1.15 mg/dL — ABNORMAL HIGH (ref 0.44–1.00)
GFR, Estimated: 51 mL/min — ABNORMAL LOW (ref >60)
Glucose, Bld: 152 mg/dL — ABNORMAL HIGH (ref 70–99)
Potassium: 4.7 mmol/L (ref 3.5–5.1)
Sodium: 139 mmol/L (ref 135–145)

## 2021-08-07 LAB — GLUCOSE, CAPILLARY
Glucose-Capillary: 113 mg/dL — ABNORMAL HIGH (ref 70–99)
Glucose-Capillary: 66 mg/dL — ABNORMAL LOW (ref 70–99)
Glucose-Capillary: 220 mg/dL — ABNORMAL HIGH (ref 70–99)

## 2021-08-07 LAB — MAGNESIUM: Magnesium: 2.1 mg/dL (ref 1.7–2.4)

## 2021-08-07 LAB — CBC
HCT: 31.8 % — ABNORMAL LOW (ref 36.0–46.0)
Hemoglobin: 10.8 g/dL — ABNORMAL LOW (ref 12.0–15.0)
MCH: 27.6 pg (ref 26.0–34.0)
MCHC: 34 g/dL (ref 30.0–36.0)
MCV: 81.3 fL (ref 80.0–100.0)
Platelets: 345 10*3/uL (ref 150–400)
RBC: 3.91 MIL/uL (ref 3.87–5.11)
RDW: 14.3 % (ref 11.5–15.5)
WBC: 6.4 10*3/uL (ref 4.0–10.5)
nRBC: 0 % (ref 0.0–0.2)

## 2021-08-07 MED ORDER — AMOXICILLIN-POT CLAVULANATE 875-125 MG PO TABS: 1.0000 | ORAL_TABLET | Freq: Two times a day (BID) | ORAL | 0 refills | Status: AC

## 2021-08-07 MED ORDER — INSULIN GLARGINE-YFGN 100 UNIT/ML ~~LOC~~ SOLN
20.0000 [IU] | Freq: Every day | SUBCUTANEOUS | Status: DC
  Administered 2021-08-07: 20 [IU] via SUBCUTANEOUS
  Filled 2021-08-07: qty 0.2

## 2021-08-07 MED ORDER — AMLODIPINE BESYLATE 5 MG PO TABS: 5.0000 mg | ORAL_TABLET | Freq: Every day | ORAL | 0 refills | Status: DC

## 2021-08-07 MED ORDER — LOSARTAN POTASSIUM 50 MG PO TABS
100.0000 mg | ORAL_TABLET | Freq: Every day | ORAL | Status: DC
  Administered 2021-08-07: 100 mg via ORAL
  Filled 2021-08-07: qty 2

## 2021-08-07 MED ORDER — AMOXICILLIN-POT CLAVULANATE 875-125 MG PO TABS
1.0000 | ORAL_TABLET | Freq: Two times a day (BID) | ORAL | Status: DC
  Administered 2021-08-07: 1 via ORAL
  Filled 2021-08-07: qty 1

## 2021-08-07 MED ORDER — POLYETHYLENE GLYCOL 3350 17 G PO PACK: 17.0000 g | PACK | Freq: Every day | ORAL | 0 refills | Status: AC | PRN

## 2021-08-07 MED ORDER — LANTUS SOLOSTAR 100 UNIT/ML ~~LOC~~ SOPN: 20.0000 [IU] | PEN_INJECTOR | Freq: Every morning | SUBCUTANEOUS | 0 refills | Status: DC

## 2021-08-07 MED ORDER — FERROUS SULFATE 325 (65 FE) MG PO TABS: 325.0000 mg | ORAL_TABLET | Freq: Every day | ORAL | 0 refills | Status: AC

## 2021-08-07 NOTE — Evaluation (Signed)
Physical Therapy Evaluation Patient Details Name: [name] MRN: [id number] DOB: 1950-12-30 Today's Date: 08/07/2021  History of Present Illness  Past medical history of type II DM, factor V Leyden deficiency, nonocclusive CAD, OSA, hypothyroidism, HTN, GERD presents 08/04/2021 with complaints of high sugars, and confusion and sepsis secondary to aspiration pneumonia.  Clinical Impression  The patient is progressing well. Required no assistance with ambulation, did use Rw. At baseline, independent without device.  Patient is to be DC'd today. No PT needs..        Recommendations for follow up therapy are one component of a multi-disciplinary discharge planning process, led by the attending physician.  Recommendations may be updated based on patient status, additional functional criteria and insurance authorization.  Follow Up Recommendations No PT follow up    Assistance Recommended at Discharge None  Patient can return home with the following  Assist for transportation;Assistance with cooking/housework    Equipment Recommendations None recommended by PT  Recommendations for Other Services       Functional Status Assessment Patient has not had a recent decline in their functional status     Precautions / Restrictions Precautions Precautions: None      Mobility  Bed Mobility Overal bed mobility: Needs Assistance Bed Mobility: Supine to Sit     Supine to sit: Modified independent (Device/Increase time)          Transfers Overall transfer level: Needs assistance Equipment used: Rolling walker (2 wheels) Transfers: Sit to/from Stand Sit to Stand: Independent                Ambulation/Gait Ambulation/Gait assistance: Supervision Gait Distance (Feet): 20 Feet (x 2) Assistive device: Rolling walker (2 wheels) Gait Pattern/deviations: Step-through pattern          Stairs            Wheelchair Mobility    Modified Rankin (Stroke Patients  Only)       Balance Overall balance assessment: No apparent balance deficits (not formally assessed)                                           Pertinent Vitals/Pain Pain Assessment Faces Pain Scale: No hurt    Home Living Family/patient expects to be discharged to:: Private residence Living Arrangements: Other relatives Available Help at Discharge: Family;Available 24 hours/day Type of Home: House Home Access: Stairs to enter   [employer] of Steps: 4   Home Layout: One level Home Equipment: Cane - single point      Prior Function Prior Level of Function : Independent/Modified Independent                     Hand Dominance        Extremity/Trunk Assessment   Upper Extremity Assessment Upper Extremity Assessment: Overall WFL for tasks assessed    Lower Extremity Assessment Lower Extremity Assessment: Overall WFL for tasks assessed    Cervical / Trunk Assessment Cervical / Trunk Assessment: Normal  Communication   Communication: No difficulties  Cognition Arousal/Alertness: Awake/alert Behavior During Therapy: WFL for tasks assessed/performed Overall Cognitive Status: Within Functional Limits for tasks assessed  General Comments General comments (skin integrity, edema, etc.): stood in BR  and took a sponge bath independently    Exercises     Assessment/Plan    PT Assessment Patient needs continued PT services  PT Problem List Decreased activity tolerance;Decreased strength       PT Treatment Interventions Gait training    PT Goals (Current goals can be found in the Care Plan section)  Acute Rehab PT Goals Patient Stated Goal: to go home PT Goal Formulation: All assessment and education complete, DC therapy (has been DC'd today)    Frequency Min 2X/week     Co-evaluation               AM-PAC PT "6 Clicks" Mobility  Outcome Measure Help  needed turning from your back to your side while in a flat bed without using bedrails?: None Help needed moving from lying on your back to sitting on the side of a flat bed without using bedrails?: None Help needed moving to and from a bed to a chair (including a wheelchair)?: None Help needed standing up from a chair using your arms (e.g., wheelchair or bedside chair)?: None Help needed to walk in hospital room?: A Little Help needed climbing 3-5 steps with a railing? : A Little 6 Click Score: 22    End of Session   Activity Tolerance: Patient tolerated treatment well Patient left: in chair;with call bell/phone within reach Nurse Communication: Mobility status PT Visit Diagnosis: Muscle weakness (generalized) (M62.81)    Time: 2694-8546 PT Time Calculation (min) (ACUTE ONLY): 30 min   Charges:   PT Evaluation $PT Eval Low Complexity: 1 Low PT Treatments $Self Care/Home Management: 8-22        [place] Pager [phone] Office [phone]   Claretha Cooper 08/07/2021, 12:35 PM

## 2021-08-07 NOTE — TOC Transition Note (Signed)
Transition of Care [facility]) - CM/SW Discharge Note  Patient Details  Name: [name] MRN: [id number] Date of Birth: 1950-07-22  Transition of Care [facility]) CM/SW Contact:  Sherie Don, LCSW Phone Number: 08/07/2021, 11:56 AM  Clinical Narrative: [facility] consulted for meal assistance. Information for Meal on Wheels added to AVS and CSW spoke with patient regarding meals referral through [place]. Patient also asked about meals through her insurance. CSW recommended patient call her insurance to set up home delivered meals after discharge. TOC signing off.  Final next level of care: Home/Self Care Barriers to Discharge: Barriers Resolved  Patient Goals and CMS Choice Choice offered to / list presented to : NA  Discharge Plan and Services      DME Arranged: N/A DME Agency: NA  Readmission Risk Interventions     No data to display

## 2021-08-07 NOTE — Inpatient Diabetes Management (Signed)
Inpatient Diabetes Program Recommendations  AACE/ADA: New Consensus Statement on Inpatient Glycemic Control (2015)  Target Ranges:  Prepandial:   less than 140 mg/dL      Peak postprandial:   less than 180 mg/dL (1-2 hours)      Critically ill patients:  140 - 180 mg/dL    Latest Reference Range & Units 08/06/21 07:34 08/06/21 11:57 08/06/21 17:06 08/06/21 21:24  Glucose-Capillary 70 - 99 mg/dL 146 (H)  5 units Novolog  92  3 units Novolog  30 units Semglee '@1028'$   110 (H) 160 (H)  (H): Data is abnormally high  Latest Reference Range & Units 08/07/21 07:39  Glucose-Capillary 70 - 99 mg/dL 66 (L)  (L): Data is abnormally low     Home DM Meds: Lantus 35 units Daily       Humalog 0-25 units TID per SSI         Current Orders: Semglee 30 units daily      Novolog Moderate Correction Scale/ SSI (0-15 units) TID AC + HS      Novolog 3 units TID with meals     MD- Note Hypoglycemia this AM (CBG 66)  Please consider reducing the Semglee to 25 units Daily     --Will follow patient during hospitalization--  Wyn Quaker RN, MSN, CDE Diabetes Coordinator Inpatient Glycemic Control Team Team Pager: [phone] (8a-5p)

## 2021-08-07 NOTE — ED Provider Notes (Signed)
ED was notified that patient having 1 out of 4 blood cultures positive for gram-positive clusters.  I discussed the case with Dr. Posey Pronto who discharged the patient today.  Clinically she was doing well.  She was afebrile.  He is aware of the positive blood culture.  ID is still pending but felt most likely to be a contaminant.   Dorie Rank, MD 08/07/21 [phone]

## 2021-08-07 NOTE — Telephone Encounter (Signed)
Called by micro lab about positive blood culture. Notified Dr. Tomi Bamberger, ED physician. Pt recently admitted. Calling Dr. Posey Pronto, Hospitalist for further instruction

## 2021-08-07 NOTE — Progress Notes (Signed)
Patient has received discharge instructions and education. IV's have been removed and patient states that her son is arriving. Patient has her belongings.

## 2021-08-08 NOTE — Discharge Summary (Addendum)
Physician Discharge Summary   Patient: [name] MRN: [id number] DOB: Mar 30, 1950  Admit date:     08/04/2021  Discharge date: 08/07/2021  Discharge Physician: Berle Mull  PCP: Cipriano Mile, NP  Recommendations at discharge: Follow-up with PCP in 1 week.  Discharge Diagnoses: Principal Problem:   Type 2 diabetes mellitus with ketoacidosis without coma, with long-term current use of insulin (HCC) Active Problems:   Acute metabolic encephalopathy   SIRS (systemic inflammatory response syndrome) (HCC)   Lactic acidosis   Acute anemia   Acute renal failure superimposed on stage 3a chronic kidney disease (HCC)   Essential hypertension   Hypothyroidism   GERD without esophagitis   DKA (diabetic ketoacidosis) [facility])  Hospital Course: Past medical history of type II DM, factor V Leyden deficiency, nonocclusive CAD, OSA, hypothyroidism, HTN, GERD presents with complaints of high sugars, and confusion and sepsis secondary to aspiration pneumonia.  Assessment and Plan: Aspiration pneumonia. Severe sepsis present on admission. Present with confusion found to have fever, tachycardia, tachypnea.  Evidence of endorgan damage with lactic acidosis and confusion. X-ray and CT scan shows evidence of right sided pneumonia. Blood cultures so far no growth. MRSA PCR negative. Was on IV Zosyn.  Will transition to p.o. Augmentin. Blood cultures after discharge came back positive 1 out of 4 bottles with gram-positive cocci in cluster most likely contamination. Speech therapy consulted.  Currently on regular diet.  DKA. Type 2 diabetes mellitus, uncontrolled with hyperglycemia with long-term insulin use with CKD 3A. Started on IV insulin drip. Anion gap actually closed on. Metabolic acidosis also appears to have improved. I suspect that the DKA was not a significant finding.  Most likely the anion gap is secondary to lactic acidosis. Continue with basal bolus regimen.  Dose reduced due to  episode of hypoglycemia.  Patient actually stopped using insulin Lantus recently and therefore her sugars were elevated.   Concern for dysphagia.  Ruled out. Appreciate speech therapy consultation. Patient is currently on regular diet.  Acute kidney injury on stage IIIa chronic kidney disease. Hypokalemia Baseline serum creatinine 0.8. Currently serum creatinine 1.2. Monitor. Potassium being replaced.  HTN. Blood pressure remains elevated. Norvasc added.  Losartan on hold due to AKI. We will resume. monitor.  GERD. Iron deficiency anemia. Currently on PPI. Hemoglobin stable. We will initiate oral iron. No acute bleeding reported by the patient.   Hypothyroidism. TSH is actually rather elevated. Continue Synthroid for now.  Abdominal pain. Lactic acidosis. CT abdomen unremarkable. No evidence of colitis or ischemia. Monitor.   Obesity. Body mass index is 30.73 kg/m.  Placing the pt at higher risk of poor outcomes.   Consultants: none Procedures performed:  none DISCHARGE MEDICATION: Allergies as of 08/07/2021   No Known Allergies      Medication List     STOP taking these medications    diclofenac 75 MG EC tablet Commonly known as: VOLTAREN   ibuprofen 600 MG tablet Commonly known as: ADVIL       TAKE these medications    acetaminophen 500 MG tablet Commonly known as: TYLENOL Take 500-1,000 mg by mouth every 6 (six) hours as needed for mild pain or headache.   albuterol 108 (90 Base) MCG/ACT inhaler Commonly known as: VENTOLIN HFA Inhale 2 puffs into the lungs every 6 (six) hours as needed for wheezing or shortness of breath.   amLODipine 5 MG tablet Commonly known as: NORVASC Take 1 tablet (5 mg total) by mouth daily.   amoxicillin-clavulanate 875-125 MG tablet  Commonly known as: AUGMENTIN Take 1 tablet by mouth 2 (two) times daily for 5 days.   ascorbic acid 500 MG tablet Commonly known as: VITAMIN C Take 500 mg by mouth daily.    ASPIRIN 81 PO Take 81 mg by mouth daily.   ezetimibe-simvastatin 10-40 MG tablet Commonly known as: VYTORIN Take 1 tablet by mouth daily. PATIENT MUST SCHEDULE APPOINTMENT FOR FUTURE REFILLS. FIRST ATTEMPT   ferrous sulfate 325 (65 FE) MG tablet Take 1 tablet (325 mg total) by mouth daily with breakfast.   HumaLOG KwikPen 100 UNIT/ML KwikPen Generic drug: insulin lispro Inject 0-25 Units into the skin See admin instructions. Inject 0-25 units into the skin three to four times a day, PER SLIDING SCALE   Lantus SoloStar 100 UNIT/ML Solostar Pen Generic drug: insulin glargine Inject 20 Units into the skin in the morning. What changed: how much to take   levothyroxine 200 MCG tablet Commonly known as: SYNTHROID Take 200 mcg by mouth daily before breakfast.   losartan 100 MG tablet Commonly known as: COZAAR Take 1 tablet (100 mg total) by mouth daily.   Mouthwash Compounding Base Liqd Swish and spit 15 mLs every 6 (six) hours as needed. Maalox 80 mL; nystatin 100,000 units/ML  80 mL; viscous lidocaine 2%- 5m   nystatin 100000 UNIT/ML suspension Commonly known as: MYCOSTATIN Take 5 mLs (500,000 Units total) by mouth 4 (four) times daily.   OneTouch Ultra test strip Generic drug: glucose blood   OneTouch Verio w/Device Kit See admin instructions.   pantoprazole 40 MG tablet Commonly known as: PROTONIX Take 1 tablet (40 mg total) by mouth daily at 6 (six) AM. What changed: when to take this   polyethylene glycol 17 g packet Commonly known as: MIRALAX / GLYCOLAX Take 17 g by mouth daily as needed for mild constipation.        Follow-up Information     SCipriano Mile NP. Schedule an appointment as soon as possible for a visit in 1 week(s).   Contact information: [id number]        [facility] [facility] Follow up.   Why: Call [facility] [facility] to get signed up for home delivered meals. Contact information: [address] [place] [address]Call ([phone] ([phone]              Disposition: Home Diet recommendation: Carb modified diet  Discharge Exam: Vitals:   08/07/21 0800 08/07/21 0912 08/07/21 1142 08/07/21 1203  BP: (!) 152/64  (!) 171/80   Pulse: 87  94   Resp: 15  18   Temp:  98.7 F (37.1 C)  98.7 F (37.1 C)  TempSrc:  Oral  Oral  SpO2: 100%  99%   Weight:      Height:       General: Appear in no distress; no visible Abnormal Neck Mass Or lumps, Conjunctiva normal Cardiovascular: S1 and S2 Present, no Murmur, Respiratory: good respiratory effort, Bilateral Air entry present and CTA, no Crackles, no wheezes Abdomen: Bowel Sound present, Non tender Extremities: no Pedal edema Neurology: alert and oriented to time, place, and person  Gait not checked due to patient safety concerns Filed Weights   08/05/21 0130 08/06/21 0500 08/07/21 0500  Weight: 80.8 kg 81.2 kg 79.6 kg   Condition at discharge: stable  The results of significant diagnostics from this hospitalization (including imaging, microbiology, ancillary and laboratory) are listed below for reference.   Imaging  Studies: CT ABDOMEN PELVIS W CONTRAST  Result Date: 08/04/2021 CLINICAL DATA:  Abdominal pain, acute, nonlocalized; Sepsis, hyperglycemia, fever EXAM: CT CHEST, ABDOMEN, AND PELVIS WITH CONTRAST TECHNIQUE: Multidetector CT imaging of the chest, abdomen and pelvis was performed following the standard protocol during bolus administration of intravenous contrast. RADIATION DOSE REDUCTION: This exam was performed according to the departmental dose-optimization program which includes automated exposure control, adjustment of the mA and/or kV according to patient size and/or use of iterative reconstruction technique. CONTRAST:  160m OMNIPAQUE IOHEXOL 300 MG/ML  SOLN COMPARISON:  None Available. FINDINGS: CT CHEST FINDINGS Cardiovascular: No significant vascular findings. Normal heart size. No  pericardial effusion. Mediastinum/Nodes: No enlarged mediastinal, hilar, or axillary lymph nodes. Thyroid gland, trachea, and esophagus demonstrate no significant findings. Lungs/Pleura: There is focal consolidation within the right upper lobe, likely infectious in the appropriate clinical setting. Minimal scattered infiltrate is also seen within the peripheral left upper lobe. Mild right basilar atelectasis. No pneumothorax or pleural effusion. No central obstructing lesion. Musculoskeletal: Osseous structures are age-appropriate. No acute bone abnormality. No lytic or blastic bone lesion. CT ABDOMEN PELVIS FINDINGS Hepatobiliary: No focal liver abnormality is seen. No gallstones, gallbladder wall thickening, or biliary dilatation. Pancreas: Unremarkable Spleen: Unremarkable Adrenals/Urinary Tract: The adrenal glands are unremarkable. The kidneys are normal in position. There is extensive cortical scarring and moderate atrophy of the right kidney. No hydronephrosis, perinephric fluid collections or inflammatory stranding, or intrarenal or ureteral calculi are identified. Left kidney is unremarkable. Bladder is unremarkable. Stomach/Bowel: Stomach is within normal limits. Appendix appears normal. No evidence of bowel wall thickening, distention, or inflammatory changes. Vascular/Lymphatic: Mild atherosclerotic calcification within the iliac vasculature. No aortic aneurysm. No pathologic adenopathy within the abdomen and pelvis. Reproductive: Multiple lobulated enhancing masses are seen within the uterus most in keeping with multiple uterine fibroids. No adnexal masses. Other: Tiny fat containing umbilical hernia. Musculoskeletal: Osseous structures are age-appropriate. No acute bone abnormality. IMPRESSION: 1. Focal consolidation within the right upper lobe, likely infectious in the appropriate clinical setting. Minimal scattered infiltrate within the peripheral left upper lobe, also likely infectious in nature. No  central obstructing mass. 2. No acute intra-abdominal pathology identified. 3. Moderate atrophy and cortical scarring of the right kidney. 4. Multiple uterine fibroids. Electronically Signed   By: AFidela SalisburyM.D.   On: 08/04/2021 22:05   CT Chest W Contrast  Result Date: 08/04/2021 CLINICAL DATA:  Abdominal pain, acute, nonlocalized; Sepsis, hyperglycemia, fever EXAM: CT CHEST, ABDOMEN, AND PELVIS WITH CONTRAST TECHNIQUE: Multidetector CT imaging of the chest, abdomen and pelvis was performed following the standard protocol during bolus administration of intravenous contrast. RADIATION DOSE REDUCTION: This exam was performed according to the departmental dose-optimization program which includes automated exposure control, adjustment of the mA and/or kV according to patient size and/or use of iterative reconstruction technique. CONTRAST:  1045mOMNIPAQUE IOHEXOL 300 MG/ML  SOLN COMPARISON:  None Available. FINDINGS: CT CHEST FINDINGS Cardiovascular: No significant vascular findings. Normal heart size. No pericardial effusion. Mediastinum/Nodes: No enlarged mediastinal, hilar, or axillary lymph nodes. Thyroid gland, trachea, and esophagus demonstrate no significant findings. Lungs/Pleura: There is focal consolidation within the right upper lobe, likely infectious in the appropriate clinical setting. Minimal scattered infiltrate is also seen within the peripheral left upper lobe. Mild right basilar atelectasis. No pneumothorax or pleural effusion. No central obstructing lesion. Musculoskeletal: Osseous structures are age-appropriate. No acute bone abnormality. No lytic or blastic bone lesion. CT ABDOMEN PELVIS FINDINGS Hepatobiliary: No focal liver abnormality is seen. No gallstones,  gallbladder wall thickening, or biliary dilatation. Pancreas: Unremarkable Spleen: Unremarkable Adrenals/Urinary Tract: The adrenal glands are unremarkable. The kidneys are normal in position. There is extensive cortical scarring  and moderate atrophy of the right kidney. No hydronephrosis, perinephric fluid collections or inflammatory stranding, or intrarenal or ureteral calculi are identified. Left kidney is unremarkable. Bladder is unremarkable. Stomach/Bowel: Stomach is within normal limits. Appendix appears normal. No evidence of bowel wall thickening, distention, or inflammatory changes. Vascular/Lymphatic: Mild atherosclerotic calcification within the iliac vasculature. No aortic aneurysm. No pathologic adenopathy within the abdomen and pelvis. Reproductive: Multiple lobulated enhancing masses are seen within the uterus most in keeping with multiple uterine fibroids. No adnexal masses. Other: Tiny fat containing umbilical hernia. Musculoskeletal: Osseous structures are age-appropriate. No acute bone abnormality. IMPRESSION: 1. Focal consolidation within the right upper lobe, likely infectious in the appropriate clinical setting. Minimal scattered infiltrate within the peripheral left upper lobe, also likely infectious in nature. No central obstructing mass. 2. No acute intra-abdominal pathology identified. 3. Moderate atrophy and cortical scarring of the right kidney. 4. Multiple uterine fibroids. Electronically Signed   By: Fidela Salisbury M.D.   On: 08/04/2021 22:05   DG Chest Port 1 View  Result Date: 08/04/2021 CLINICAL DATA:  Possible sepsis EXAM: PORTABLE CHEST 1 VIEW COMPARISON:  07/21/2021 FINDINGS: Cardiac size is within normal limits. There are no signs of pulmonary edema. Increased markings are seen in the medial lower lung fields. There is poor inspiration. There is no pleural effusion or pneumothorax. IMPRESSION: Increased markings in the medial lower lung fields may suggest crowding of bronchovascular structures due to poor inspiration or small foci of atelectasis/pneumonia. Electronically Signed   By: Elmer Picker M.D.   On: 08/04/2021 13:57   DG Chest 2 View  Result Date: 07/21/2021 CLINICAL DATA:  chest  pain EXAM: CHEST - 2 VIEW COMPARISON:  Chest x-ray 02/06/2020, CT chest 02/03/2020 FINDINGS: The heart and mediastinal contours are within normal limits. No focal consolidation. No pulmonary edema. No pleural effusion. No pneumothorax. No acute osseous abnormality. IMPRESSION: No active cardiopulmonary disease. Electronically Signed   By: Iven Finn M.D.   On: 07/21/2021 23:54    Microbiology: Results for orders placed or performed during the hospital encounter of 08/04/21  Urine Culture     Status: None   Collection Time: 08/04/21  1:05 PM   Specimen: In/Out Cath Urine  Result Value Ref Range Status   Specimen Description   Final    IN/OUT CATH URINE Performed at [facility], [place] [address] [place], [place] [address]    Special Requests   Final    NONE Performed at [facility], [place] [address] [place], [place] [address]    Culture   Final    NO GROWTH Performed at [facility], [place] [address] [place], [place] [address]    Report Status 08/06/2021 FINAL  Final  Blood Culture (routine x 2)     Status: None (Preliminary result)   Collection Time: 08/04/21  1:15 PM   Specimen: BLOOD  Result Value Ref Range Status   Specimen Description   Final    BLOOD LEFT ANTECUBITAL Performed at [facility], [place] [address] [place], [place] [address]    Special Requests   Final    BOTTLES DRAWN AEROBIC AND ANAEROBIC Blood Culture adequate volume Performed at [place] [address], [place], [place] [address]    Culture  Setup Time   Final  GRAM POSITIVE COCCI IN CLUSTERS AEROBIC BOTTLE ONLY CRITICAL RESULT CALLED TO, READ BACK BY AND VERIFIED WITH: RN ALICE NOAH ON 05/28/71 @ 1750 BY DRT Performed at [facility], [place] [address] [place], [place] [address]    Culture GRAM POSITIVE COCCI IN CLUSTERS  Final   Report Status PENDING  Incomplete  Blood Culture ID Panel (Reflexed)     Status: None    Collection Time: 08/04/21  1:15 PM  Result Value Ref Range Status   Enterococcus faecalis NOT DETECTED NOT DETECTED Final   Enterococcus Faecium NOT DETECTED NOT DETECTED Final   Listeria monocytogenes NOT DETECTED NOT DETECTED Final   Staphylococcus species NOT DETECTED NOT DETECTED Final   Staphylococcus aureus (BCID) NOT DETECTED NOT DETECTED Final   Staphylococcus epidermidis NOT DETECTED NOT DETECTED Final   Staphylococcus lugdunensis NOT DETECTED NOT DETECTED Final   Streptococcus species NOT DETECTED NOT DETECTED Final   Streptococcus agalactiae NOT DETECTED NOT DETECTED Final   Streptococcus pneumoniae NOT DETECTED NOT DETECTED Final   Streptococcus pyogenes NOT DETECTED NOT DETECTED Final   A.calcoaceticus-baumannii NOT DETECTED NOT DETECTED Final   Bacteroides fragilis NOT DETECTED NOT DETECTED Final   Enterobacterales NOT DETECTED NOT DETECTED Final   Enterobacter cloacae complex NOT DETECTED NOT DETECTED Final   Escherichia coli NOT DETECTED NOT DETECTED Final   Klebsiella aerogenes NOT DETECTED NOT DETECTED Final   Klebsiella oxytoca NOT DETECTED NOT DETECTED Final   Klebsiella pneumoniae NOT DETECTED NOT DETECTED Final   Proteus species NOT DETECTED NOT DETECTED Final   Salmonella species NOT DETECTED NOT DETECTED Final   Serratia marcescens NOT DETECTED NOT DETECTED Final   Haemophilus influenzae NOT DETECTED NOT DETECTED Final   Neisseria meningitidis NOT DETECTED NOT DETECTED Final   Pseudomonas aeruginosa NOT DETECTED NOT DETECTED Final   Stenotrophomonas maltophilia NOT DETECTED NOT DETECTED Final   Candida albicans NOT DETECTED NOT DETECTED Final   Candida auris NOT DETECTED NOT DETECTED Final   Candida glabrata NOT DETECTED NOT DETECTED Final   Candida krusei NOT DETECTED NOT DETECTED Final   Candida parapsilosis NOT DETECTED NOT DETECTED Final   Candida tropicalis NOT DETECTED NOT DETECTED Final   Cryptococcus neoformans/gattii NOT DETECTED NOT DETECTED Final     Comment: Performed at [facility], [address], [place], [place] [address]  Resp Panel by RT-PCR (Flu A&B, Covid) Anterior Nasal Swab     Status: None   Collection Time: 08/04/21  2:12 PM   Specimen: Anterior Nasal Swab  Result Value Ref Range Status   SARS Coronavirus 2 by RT PCR NEGATIVE NEGATIVE Final    Comment: (NOTE) SARS-CoV-2 target nucleic acids are NOT DETECTED.  The SARS-CoV-2 RNA is generally detectable in upper respiratory specimens during the acute phase of infection. The lowest concentration of SARS-CoV-2 viral copies this assay can detect is 138 copies/mL. A negative result does not preclude SARS-Cov-2 infection and should not be used as the sole basis for treatment or other patient management decisions. A negative result may occur with  improper specimen collection/handling, submission of specimen other than nasopharyngeal swab, presence of viral mutation(s) within the areas targeted by this assay, and inadequate number of viral copies(<138 copies/mL). A negative result must be combined with clinical observations, patient history, and epidemiological information. The expected result is Negative.  Fact Sheet for Patients:  EntrepreneurPulse.com.au  Fact Sheet for Healthcare Providers:  IncredibleEmployment.be  This test is no t yet approved or cleared by the [place] and  has been authorized for detection and/or diagnosis of SARS-CoV-2 by FDA under an Emergency Use Authorization (EUA). This EUA will remain  in effect (meaning this test can be used) for the duration of the COVID-19 declaration under Section 564(b)(1) of the Act, 21 U.S.C.section 360bbb-3(b)(1), unless the authorization is terminated  or revoked sooner.       Influenza A by PCR NEGATIVE NEGATIVE Final   Influenza B by PCR NEGATIVE NEGATIVE Final    Comment: (NOTE) The Xpert Xpress SARS-CoV-2/FLU/RSV plus assay is intended as an aid in  the diagnosis of influenza from Nasopharyngeal swab specimens and should not be used as a sole basis for treatment. Nasal washings and aspirates are unacceptable for Xpert Xpress SARS-CoV-2/FLU/RSV testing.  Fact Sheet for Patients: EntrepreneurPulse.com.au  Fact Sheet for Healthcare Providers: IncredibleEmployment.be  This test is not yet approved or cleared by the [place] FDA and has been authorized for detection and/or diagnosis of SARS-CoV-2 by FDA under an Emergency Use Authorization (EUA). This EUA will remain in effect (meaning this test can be used) for the duration of the COVID-19 declaration under Section 564(b)(1) of the Act, 21 U.S.C. section 360bbb-3(b)(1), unless the authorization is terminated or revoked.  Performed at [facility], [place] [address], [place], [place] [address]   MRSA Next Gen by PCR, Nasal     Status: None   Collection Time: 08/05/21  1:30 AM   Specimen: Nasal Mucosa; Nasal Swab  Result Value Ref Range Status   MRSA by PCR Next Gen NOT DETECTED NOT DETECTED Final    Comment: (NOTE) The GeneXpert MRSA Assay (FDA approved for NASAL specimens only), is one component of a comprehensive MRSA colonization surveillance program. It is not intended to diagnose MRSA infection nor to guide or monitor treatment for MRSA infections. Test performance is not FDA approved in patients less than 71 years old. Performed at [facility], [place] [address] [place], [place] [address]   Blood Culture (routine x 2)     Status: None (Preliminary result)   Collection Time: 08/05/21  2:59 AM   Specimen: BLOOD  Result Value Ref Range Status   Specimen Description   Final    BLOOD BLOOD LEFT HAND Performed at [place] [address], [place], [place] [address]    Special Requests   Final    BOTTLES DRAWN AEROBIC AND ANAEROBIC Blood Culture adequate volume Performed at  [place] [address] [place], [place] [address]    Culture   Final    NO GROWTH 2 DAYS Performed at [place] [address] [place], [place] [address]    Report Status PENDING  Incomplete    Labs: CBC: Recent Labs  Lab 08/04/21 1517 08/04/21 2044 08/05/21 0259 08/06/21 0244 08/07/21 0240  WBC 7.0 9.0 12.6* 8.1 6.4  NEUTROABS 5.2 7.2 8.6*  --   --   HGB 8.1* 11.2* 11.3* 11.1* 10.8*  HCT 23.8* 32.8* 34.0* 33.0* 31.8*  MCV 83.8 82.6 84.4 81.9 81.3  PLT 203 218 287 298 539   Basic Metabolic Panel: Recent Labs  Lab 08/04/21 1701 08/04/21 2044 08/05/21 0259 08/06/21 0244 08/07/21 0240  NA 131* 132* 133* 135 139  K 4.1 4.0 3.9 3.3* 4.7  CL 102 103 105 105 109  CO2 13* 13* 16* 21* 21*  GLUCOSE 327* 327* 112* 202* 152*  BUN _0 CREATININE 1.28* 1.24* 1.21* 1.26* 1.15*  CALCIUM 8.6*  8.7* 8.8* 8.2* 8.4*  MG  --   --  1.8 2.4 2.1   Liver Function Tests: Recent Labs  Lab 08/04/21 1315 08/05/21 0259  AST 47* 34  ALT 20 14  ALKPHOS 75 66  BILITOT 1.6* 0.9  PROT 8.0 6.9  ALBUMIN 3.4* 2.9*   CBG: Recent Labs  Lab 08/06/21 1706 08/06/21 2124 08/07/21 0739 08/07/21 0817 08/07/21 1145  GLUCAP 110* 160* 66* 113* 220*    Discharge time spent: greater than 30 minutes.  Signed: Berle Mull, MD [facility] 08/07/2021

## 2021-08-09 NOTE — Progress Notes (Signed)
Patient no show; just discharged from hospital today. No charge.

## 2021-08-10 LAB — CULTURE, BLOOD (ROUTINE X 2)
Culture: NO GROWTH
Special Requests: ADEQUATE

## 2021-08-11 LAB — CULTURE, BLOOD (ROUTINE X 2): Special Requests: ADEQUATE

## 2021-09-22 ENCOUNTER — Other Ambulatory Visit: Payer: Self-pay | Admitting: Podiatry

## 2021-09-25 ENCOUNTER — Ambulatory Visit
Admission: EM | Admit: 2021-09-25 | Discharge: 2021-09-25 | Disposition: A | Discharge status: Home / Self Care | Payer: Medicare Other | Attending: Physician Assistant | Admitting: Physician Assistant

## 2021-09-25 ENCOUNTER — Encounter: Payer: Self-pay | Admitting: Emergency Medicine

## 2021-09-25 ENCOUNTER — Ambulatory Visit (HOSPITAL_COMMUNITY)
Admission: RE | Admit: 2021-09-25 | Discharge: 2021-09-25 | Disposition: A | Discharge status: Home / Self Care | Payer: Medicare Other | Source: Ambulatory Visit | Attending: Physician Assistant | Admitting: Physician Assistant

## 2021-09-25 ENCOUNTER — Other Ambulatory Visit: Payer: Self-pay

## 2021-09-25 DIAGNOSIS — Z86718 Personal history of other venous thrombosis and embolism: Secondary | ICD-10-CM | POA: Diagnosis not present

## 2021-09-25 DIAGNOSIS — M79604 Pain in right leg: Secondary | ICD-10-CM

## 2021-09-25 DIAGNOSIS — M79606 Pain in leg, unspecified: Secondary | ICD-10-CM | POA: Insufficient documentation

## 2021-09-25 DIAGNOSIS — M79661 Pain in right lower leg: Secondary | ICD-10-CM

## 2021-09-25 NOTE — Progress Notes (Signed)
RLE venous duplex has been completed.  Preliminary results given to Ewell Poe, PA-C.   Results can be found under chart review under CV PROC. 09/25/2021 4:55 PM Juliona Vales RVT, RDMS

## 2021-09-25 NOTE — ED Provider Notes (Signed)
[facility]    CSN: [id number] Arrival date & time: 09/25/21  1427      History   Chief Complaint Chief Complaint  Patient presents with   Leg Pain    HPI [name] is a 71 y.o. female.   Patient here today for evaluation of right lower leg pain that started about a week ago. She notes that walking makes pain worse. She does not have history of recent injury but has had DVT in the past. 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   The history is provided by the patient.    Past Medical History:  Diagnosis Date   Clotting disorder ([place])    Constipation    history of   Diabetes mellitus without complication ([place])    DVT (deep venous thrombosis) ([place])    2007   Factor V Leiden ([place])    deficiency    GERD (gastroesophageal reflux disease)    Hypertension    Hypothyroidism    Kidney disease    early stage   NSTEMI (non-ST elevated myocardial infarction) ([place])    2011 - possibly r/t DVT/embolization?    Sleep apnea    no c-pap    Patient Active Problem List   Diagnosis Date Noted   DKA (diabetic ketoacidosis) ([place]) 85/88/5027   Acute metabolic encephalopathy 74/01/8785   SIRS (systemic inflammatory response syndrome) ([place]) 08/04/2021   Lactic acidosis 08/04/2021   Acute anemia 08/04/2021   Acute renal failure superimposed on stage 3a chronic kidney disease ([place]) 08/04/2021   Idiopathic sleep related nonobstructive alveolar hypoventilation 10/10/2020   Noncompliance with treatment 10/10/2020   Cyst of right breast 09/20/2020   Acute non-ST segment elevation myocardial infarction ([place]) 09/19/2020   Deep venous thrombosis ([place]) 09/19/2020   Factor V Leiden mutation ([place]) 09/19/2020   GERD without esophagitis 09/19/2020   Sleep apnea 09/19/2020   Activated protein C resistance ([place]) 03/24/2020   Coronary artery disease 03/24/2020   Diabetic nephropathy ([place]) 03/24/2020   Hyperlipidemia 03/24/2020   Long term (current) use  of insulin ([place]) 03/24/2020   Old myocardial infarction 03/24/2020   Pneumonia due to COVID-19 virus    Chest pain 01/06/2018   Chronic kidney disease, stage 3a ([place]) 01/06/2018   History of DVT (deep vein thrombosis) 07/18/2017   Right leg pain 07/18/2017   Swelling of both lower extremities 07/18/2017   Dyslipidemia 07/18/2017   Insulin-requiring or dependent type II diabetes mellitus ([place]) 07/01/2016   Hx of non-ST elevation myocardial infarction (NSTEMI) 07/01/2016   Screening for lipid disorders 07/01/2016   Type 2 diabetes mellitus with ketoacidosis without coma, with long-term current use of insulin ([place]) 06/22/2015   COUGH 11/27/2007   PHLEBITIS, SUPERFICIAL VEINS, UPPER LIMB 09/29/2006   VENOUS INSUFFICIENCY, CHRONIC, RIGHT LEG 07/30/2006   Hypothyroidism 04/17/2006   Essential hypertension 04/17/2006   DEEP VEIN THROMBOPHLEBITIS, LEG 04/17/2006    Past Surgical History:  Procedure Laterality Date   CARDIAC CATHETERIZATION  10/21/2009   mild bridging(?) segment in mid LAD, otherwise normal coronaries (Dr. Alma Friendly)   [place]     when 6 months old   LOWER EXTREMITY ARTERIAL DOPPLER  2011   normal LEA   SLEEP STUDY  2011   AHI 8.5 and REM AHI 18   TRANSTHORACIC ECHOCARDIOGRAM  2011   EF 55-60%, trivial MR, TR, pulm valve regurg   TUBAL LIGATION  1995   UPPER GASTROINTESTINAL ENDOSCOPY  OB History   No obstetric history on file.      Home Medications    Prior to Admission medications   Medication Sig Start Date End Date Taking? Authorizing Provider  acetaminophen (TYLENOL) 500 MG tablet Take 500-1,000 mg by mouth every 6 (six) hours as needed for mild pain or headache.    [provider]  albuterol (VENTOLIN HFA) 108 (90 Base) MCG/ACT inhaler Inhale 2 puffs into the lungs every 6 (six) hours as needed for wheezing or shortness of breath. Patient not taking: Reported on 08/04/2021 02/08/20   Kayleen Memos, DO   amLODipine (NORVASC) 5 MG tablet Take 1 tablet (5 mg total) by mouth daily. 08/08/21   Lavina Hamman, MD  ascorbic acid (VITAMIN C) 500 MG tablet Take 500 mg by mouth daily.    [provider]  ASPIRIN 81 PO Take 81 mg by mouth daily.    [provider]  Blood Glucose Monitoring Suppl (ONETOUCH VERIO) w/Device KIT See admin instructions. 02/14/20   [provider]  ezetimibe-simvastatin (VYTORIN) 10-40 MG tablet Take 1 tablet by mouth daily. PATIENT MUST SCHEDULE APPOINTMENT FOR FUTURE REFILLS. FIRST ATTEMPT Patient not taking: Reported on 08/04/2021 01/15/21   Pixie Casino, MD  ferrous sulfate 325 (65 FE) MG tablet Take 1 tablet (325 mg total) by mouth daily with breakfast. 08/08/21   Lavina Hamman, MD  insulin glargine (LANTUS SOLOSTAR) 100 UNIT/ML Solostar Pen Inject 20 Units into the skin in the morning. 08/07/21   Lavina Hamman, MD  insulin lispro (HUMALOG KWIKPEN) 100 UNIT/ML KwikPen Inject 0-25 Units into the skin See admin instructions. Inject 0-25 units into the skin three to four times a day, PER SLIDING SCALE    [provider]  levothyroxine (SYNTHROID, LEVOTHROID) 200 MCG tablet Take 200 mcg by mouth daily before breakfast.     [provider]  losartan (COZAAR) 100 MG tablet Take 1 tablet (100 mg total) by mouth daily. 02/09/20 08/04/21  Kayleen Memos, DO  Mouthwash Compounding Base LIQD Swish and spit 15 mLs every 6 (six) hours as needed. Maalox 80 mL; nystatin 100,000 units/ML  80 mL; viscous lidocaine 2%- 18ml Patient not taking: Reported on 07/22/2021 06/23/20   Chase Picket, MD  nystatin (MYCOSTATIN) 100000 UNIT/ML suspension Take 5 mLs (500,000 Units total) by mouth 4 (four) times daily. Patient not taking: Reported on 07/22/2021 08/06/20   Luna Fuse, MD  [facility] ULTRA test strip  01/25/20   [provider]  pantoprazole (PROTONIX) 40 MG tablet Take 1 tablet (40 mg total) by mouth daily at 6 (six) AM. Patient taking  differently: Take 40 mg by mouth daily before breakfast. 01/08/18   Regalado, Belkys A, MD  polyethylene glycol (MIRALAX / GLYCOLAX) 17 g packet Take 17 g by mouth daily as needed for mild constipation. 08/07/21   Lavina Hamman, MD    Family History Family History  Problem Relation Age of Onset   Lung cancer Father    Cancer Father    Diabetes Father    Other Mother        childbirth - enlarged heart   Cancer Paternal Grandmother    Diabetes Paternal Grandmother    Mental illness Paternal Grandmother    Hypertension Paternal Grandfather    Colon cancer Neg Hx     Social History Social History   Tobacco Use   Smoking status: Never   Smokeless tobacco: Never  Vaping Use   Vaping Use:  Never used  Substance Use Topics   Alcohol use: Yes    Alcohol/week: 0.0 standard drinks of alcohol    Comment: wine   Drug use: No     Allergies   Patient has no known allergies.   Review of Systems Review of Systems  Constitutional:  Negative for chills and fever.  Eyes:  Negative for discharge and redness.  Gastrointestinal:  Negative for nausea and vomiting.  Musculoskeletal:  Positive for myalgias.  Skin:  Negative for color change.  Neurological:  Negative for numbness.     Physical Exam Triage Vital Signs ED Triage Vitals  Enc Vitals Group     BP      Pulse      Resp      Temp      Temp src      SpO2      Weight      Height      Head Circumference      Peak Flow      Pain Score      Pain Loc      Pain Edu?      Excl. in [place]?    No data found.  Updated Vital Signs BP (!) 154/83 (BP Location: Left Arm)   Pulse 70   Temp 98.4 F (36.9 C) (Oral)   Resp 18   SpO2 97%      Physical Exam Vitals and nursing note reviewed.  Constitutional:      General: She is not in acute distress.    Appearance: Normal appearance. She is not ill-appearing.  HENT:     Head: Normocephalic and atraumatic.  Eyes:     Conjunctiva/sclera: Conjunctivae normal.   Cardiovascular:     Rate and Rhythm: Normal rate.  Pulmonary:     Effort: Pulmonary effort is normal.  Musculoskeletal:     Comments: Mild swelling to right lower leg, positive Homans sign.   Skin:    General: Skin is warm.  Neurological:     Mental Status: She is alert.     Comments: Gross sensation intact to right toes  Psychiatric:        Mood and Affect: Mood normal.        Behavior: Behavior normal.        Thought Content: Thought content normal.      UC Treatments / Results  Labs (all labs ordered are listed, but only abnormal results are displayed) Labs Reviewed - No data to display  EKG   Radiology No results found.  Procedures Procedures (including critical care time)  Medications Ordered in UC Medications - No data to display  Initial Impression / Assessment and Plan / UC Course  I have reviewed the triage vital signs and the nursing notes.  Pertinent labs & imaging results that were available during my care of the patient were reviewed by me and considered in my medical decision making (see chart for details).    Ultrasound ordered to rule out DVT. Will await results for further recommendation.   Final Clinical Impressions(s) / UC Diagnoses   Final diagnoses:  Pain in right lower leg     Discharge Instructions       Ultrasound scheduled for   4:00 PM at [facility]     ED Prescriptions   None    PDMP not reviewed this encounter.   Francene Finders, PA-C 09/25/21 [phone]

## 2021-09-25 NOTE — ED Triage Notes (Signed)
Pt here for right lower leg and calf pain with possible swelling x 1 week; pt denies obvious injury and sts hx of factor 5 deficiency; takes ASA

## 2021-09-25 NOTE — Discharge Instructions (Addendum)
  Ultrasound scheduled for   4:00 PM at [facility]

## 2021-10-30 ENCOUNTER — Emergency Department (HOSPITAL_BASED_OUTPATIENT_CLINIC_OR_DEPARTMENT_OTHER)
Admission: EM | Admit: 2021-10-30 | Discharge: 2021-10-30 | Disposition: A | Discharge status: Home / Self Care | Payer: Medicare Other | Attending: Emergency Medicine | Admitting: Emergency Medicine

## 2021-10-30 ENCOUNTER — Encounter (HOSPITAL_BASED_OUTPATIENT_CLINIC_OR_DEPARTMENT_OTHER): Payer: Self-pay

## 2021-10-30 DIAGNOSIS — Z794 Long term (current) use of insulin: Secondary | ICD-10-CM | POA: Diagnosis not present

## 2021-10-30 DIAGNOSIS — W57XXXA Bitten or stung by nonvenomous insect and other nonvenomous arthropods, initial encounter: Secondary | ICD-10-CM | POA: Insufficient documentation

## 2021-10-30 DIAGNOSIS — I10 Essential (primary) hypertension: Secondary | ICD-10-CM | POA: Insufficient documentation

## 2021-10-30 DIAGNOSIS — E119 Type 2 diabetes mellitus without complications: Secondary | ICD-10-CM | POA: Insufficient documentation

## 2021-10-30 DIAGNOSIS — E039 Hypothyroidism, unspecified: Secondary | ICD-10-CM | POA: Diagnosis not present

## 2021-10-30 DIAGNOSIS — R2242 Localized swelling, mass and lump, left lower limb: Secondary | ICD-10-CM | POA: Diagnosis present

## 2021-10-30 DIAGNOSIS — M79672 Pain in left foot: Secondary | ICD-10-CM | POA: Diagnosis not present

## 2021-10-30 DIAGNOSIS — Z79899 Other long term (current) drug therapy: Secondary | ICD-10-CM | POA: Diagnosis not present

## 2021-10-30 MED ORDER — ACETAMINOPHEN 500 MG PO TABS
1000.0000 mg | ORAL_TABLET | Freq: Four times a day (QID) | ORAL | Status: DC | PRN
  Administered 2021-10-30: 1000 mg via ORAL
  Filled 2021-10-30: qty 2

## 2021-10-30 NOTE — ED Provider Notes (Signed)
[facility] DE[facility] Provider Note   CSN: [id number] Arrival date & time: 10/30/21  2221     History {Add pertinent medical, surgical, social history, OB history to HPI:1} Chief Complaint  Patient presents with   Insect Bite    [name] is a 71 y.o. female.  HPI     Home Medications Prior to Admission medications   Medication Sig Start Date End Date Taking? Authorizing Provider  acetaminophen (TYLENOL) 500 MG tablet Take 500-1,000 mg by mouth every 6 (six) hours as needed for mild pain or headache.    [provider]  albuterol (VENTOLIN HFA) 108 (90 Base) MCG/ACT inhaler Inhale 2 puffs into the lungs every 6 (six) hours as needed for wheezing or shortness of breath. Patient not taking: Reported on 08/04/2021 02/08/20   Kayleen Memos, DO  amLODipine (NORVASC) 5 MG tablet Take 1 tablet (5 mg total) by mouth daily. 08/08/21   Lavina Hamman, MD  ascorbic acid (VITAMIN C) 500 MG tablet Take 500 mg by mouth daily.    [provider]  ASPIRIN 81 PO Take 81 mg by mouth daily.    [provider]  Blood Glucose Monitoring Suppl (ONETOUCH VERIO) w/Device KIT See admin instructions. 02/14/20   [provider]  ezetimibe-simvastatin (VYTORIN) 10-40 MG tablet Take 1 tablet by mouth daily. PATIENT MUST SCHEDULE APPOINTMENT FOR FUTURE REFILLS. FIRST ATTEMPT Patient not taking: Reported on 08/04/2021 01/15/21   Pixie Casino, MD  ferrous sulfate 325 (65 FE) MG tablet Take 1 tablet (325 mg total) by mouth daily with breakfast. 08/08/21   Lavina Hamman, MD  insulin glargine (LANTUS SOLOSTAR) 100 UNIT/ML Solostar Pen Inject 20 Units into the skin in the morning. 08/07/21   Lavina Hamman, MD  insulin lispro (HUMALOG KWIKPEN) 100 UNIT/ML KwikPen Inject 0-25 Units into the skin See admin instructions. Inject 0-25 units into the skin three to four times a day, PER SLIDING SCALE    [provider]  levothyroxine (SYNTHROID,  LEVOTHROID) 200 MCG tablet Take 200 mcg by mouth daily before breakfast.     [provider]  losartan (COZAAR) 100 MG tablet Take 1 tablet (100 mg total) by mouth daily. 02/09/20 08/04/21  Kayleen Memos, DO  Mouthwash Compounding Base LIQD Swish and spit 15 mLs every 6 (six) hours as needed. Maalox 80 mL; nystatin 100,000 units/ML  80 mL; viscous lidocaine 2%- 39ml Patient not taking: Reported on 07/22/2021 06/23/20   Chase Picket, MD  nystatin (MYCOSTATIN) 100000 UNIT/ML suspension Take 5 mLs (500,000 Units total) by mouth 4 (four) times daily. Patient not taking: Reported on 07/22/2021 08/06/20   Luna Fuse, MD  [facility] ULTRA test strip  01/25/20   [provider]  pantoprazole (PROTONIX) 40 MG tablet Take 1 tablet (40 mg total) by mouth daily at 6 (six) AM. Patient taking differently: Take 40 mg by mouth daily before breakfast. 01/08/18   Regalado, Belkys A, MD  polyethylene glycol (MIRALAX / GLYCOLAX) 17 g packet Take 17 g by mouth daily as needed for mild constipation. 08/07/21   Lavina Hamman, MD      Allergies    Patient has no known allergies.    Review of Systems   Review of Systems  Physical Exam Updated Vital Signs BP (!) 164/60 (BP Location: Left Arm)   Pulse 72   Temp 98.2 F (36.8 C) (Oral)   Resp 18   Ht 1.626 m ($Remove'5\' 4"'[id number]$ )  Wt 81.6 kg   SpO2 100%   BMI 30.90 kg/m  Physical Exam  ED Results / Procedures / Treatments   Labs (all labs ordered are listed, but only abnormal results are displayed) Labs Reviewed - No data to display  EKG None  Radiology No results found.  Procedures Procedures  {Document cardiac monitor, telemetry assessment procedure when appropriate:1}  Medications Ordered in ED Medications  acetaminophen (TYLENOL) tablet 1,000 mg (has no administration in time range)    ED Course/ Medical Decision Making/ A&P                           Medical Decision Making Risk OTC drugs.   ***  {Document critical care  time when appropriate:1} {Document review of labs and clinical decision tools ie heart score, Chads2Vasc2 etc:1}  {Document your independent review of radiology images, and any outside records:1} {Document your discussion with family members, caretakers, and with consultants:1} {Document social determinants of health affecting pt's care:1} {Document your decision making why or why not admission, treatments were needed:1} Final Clinical Impression(s) / ED Diagnoses Final diagnoses:  Localized swelling of left foot    Rx / DC Orders ED Discharge Orders     None

## 2021-10-30 NOTE — Discharge Instructions (Signed)
You were seen today with concerns for foot swelling.  Your exam is not consistent with an insect bite.  Keep elevated tonight and try tylenol for pain.  Follow-up as scheduled with your PCP tomorrow.

## 2021-10-30 NOTE — ED Triage Notes (Signed)
Pt presents to the ED with a possible insect bite to the left foot that is causing swelling and pain. First noticed this today at 1630. No pain meds pta

## 2021-10-31 DIAGNOSIS — I872 Venous insufficiency (chronic) (peripheral): Secondary | ICD-10-CM | POA: Insufficient documentation

## 2021-10-31 DIAGNOSIS — M79605 Pain in left leg: Secondary | ICD-10-CM | POA: Insufficient documentation

## 2022-01-12 ENCOUNTER — Other Ambulatory Visit: Payer: Self-pay | Admitting: Podiatry

## 2022-03-29 ENCOUNTER — Ambulatory Visit
Admission: EM | Admit: 2022-03-29 | Discharge: 2022-03-29 | Disposition: A | Discharge status: Home / Self Care | Payer: Medicare Other | Attending: Internal Medicine | Admitting: Internal Medicine

## 2022-03-29 DIAGNOSIS — U071 COVID-19: Secondary | ICD-10-CM | POA: Insufficient documentation

## 2022-03-29 DIAGNOSIS — R059 Cough, unspecified: Secondary | ICD-10-CM | POA: Insufficient documentation

## 2022-03-29 DIAGNOSIS — J069 Acute upper respiratory infection, unspecified: Secondary | ICD-10-CM | POA: Insufficient documentation

## 2022-03-29 MED ORDER — FLUTICASONE PROPIONATE 50 MCG/ACT NA SUSP: 1.0000 | Freq: Every day | NASAL | 0 refills | Status: AC

## 2022-03-29 MED ORDER — BENZONATATE 100 MG PO CAPS: 100.0000 mg | ORAL_CAPSULE | Freq: Three times a day (TID) | ORAL | 0 refills | Status: DC | PRN

## 2022-03-29 NOTE — ED Triage Notes (Signed)
Patient presents to UC for cough, HA, runny nose, and sore throat x 3 days. Taking tylenol and nyquil.

## 2022-03-29 NOTE — ED Provider Notes (Signed)
[facility]    CSN: [id number] Arrival date & time: 03/29/22  1048      History   Chief Complaint Chief Complaint  Patient presents with   Sore Throat   Cough   Nasal Congestion   Headache    HPI [name] is a 72 y.o. female.   Patient presents with cough, headache, runny nose, sore throat that has been present for 3 days.  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  She denies any fever at home.  Denies chest pain, shortness of breath, nausea, vomiting, diarrhea, abdominal pain.  Has taken Tylenol and NyQuil for symptoms.  Denies history of asthma or COPD.    Sore Throat  Cough Headache   Past Medical History:  Diagnosis Date   Clotting disorder ([place])    Constipation    history of   Diabetes mellitus without complication ([place])    DVT (deep venous thrombosis) ([place])    2007   Factor V Leiden ([place])    deficiency    GERD (gastroesophageal reflux disease)    Hypertension    Hypothyroidism    Kidney disease    early stage   NSTEMI (non-ST elevated myocardial infarction) ([place])    2011 - possibly r/t DVT/embolization?    Sleep apnea    no c-pap    Patient Active Problem List   Diagnosis Date Noted   DKA (diabetic ketoacidosis) ([place]) 0000000   Acute metabolic encephalopathy 99991111   SIRS (systemic inflammatory response syndrome) ([place]) 08/04/2021   Lactic acidosis 08/04/2021   Acute anemia 08/04/2021   Acute renal failure superimposed on stage 3a chronic kidney disease ([place]) 08/04/2021   Idiopathic sleep related nonobstructive alveolar hypoventilation 10/10/2020   Noncompliance with treatment 10/10/2020   Cyst of right breast 09/20/2020   Acute non-ST segment elevation myocardial infarction ([place]) 09/19/2020   Deep venous thrombosis ([place]) 09/19/2020   Factor V Leiden mutation ([place]) 09/19/2020   GERD without esophagitis 09/19/2020   Sleep apnea 09/19/2020   Activated protein C resistance ([place]) 03/24/2020    Coronary artery disease 03/24/2020   Diabetic nephropathy ([place]) 03/24/2020   Hyperlipidemia 03/24/2020   Long term (current) use of insulin ([place]) 03/24/2020   Old myocardial infarction 03/24/2020   Pneumonia due to COVID-19 virus    Chest pain 01/06/2018   Chronic kidney disease, stage 3a ([place]) 01/06/2018   History of DVT (deep vein thrombosis) 07/18/2017   Right leg pain 07/18/2017   Swelling of both lower extremities 07/18/2017   Dyslipidemia 07/18/2017   Insulin-requiring or dependent type II diabetes mellitus ([place]) 07/01/2016   Hx of non-ST elevation myocardial infarction (NSTEMI) 07/01/2016   Screening for lipid disorders 07/01/2016   Type 2 diabetes mellitus with ketoacidosis without coma, with long-term current use of insulin ([place]) 06/22/2015   COUGH 11/27/2007   PHLEBITIS, SUPERFICIAL VEINS, UPPER LIMB 09/29/2006   VENOUS INSUFFICIENCY, CHRONIC, RIGHT LEG 07/30/2006   Hypothyroidism 04/17/2006   Essential hypertension 04/17/2006   DEEP VEIN THROMBOPHLEBITIS, LEG 04/17/2006    Past Surgical History:  Procedure Laterality Date   CARDIAC CATHETERIZATION  10/21/2009   mild bridging(?) segment in mid LAD, otherwise normal coronaries (Dr. Alma Friendly)   [place]     when 6 months old   LOWER EXTREMITY ARTERIAL DOPPLER  2011   normal LEA   SLEEP STUDY  2011   AHI 8.5 and REM AHI 18   TRANSTHORACIC ECHOCARDIOGRAM  2011  EF 55-60%, trivial MR, TR, pulm valve regurg   TUBAL LIGATION  1995   UPPER GASTROINTESTINAL ENDOSCOPY      OB History   No obstetric history on file.      Home Medications    Prior to Admission medications   Medication Sig Start Date End Date Taking? Authorizing Provider  benzonatate (TESSALON) 100 MG capsule Take 1 capsule (100 mg total) by mouth every 8 (eight) hours as needed for cough. 03/29/22  Yes Darrio Bade, Hildred Alamin E, FNP  fluticasone (FLONASE) 50 MCG/ACT nasal spray Place 1 spray into both nostrils daily. 03/29/22  Yes  Latoshia Monrroy, Hildred Alamin E, FNP  acetaminophen (TYLENOL) 500 MG tablet Take 500-1,000 mg by mouth every 6 (six) hours as needed for mild pain or headache.    [provider]  albuterol (VENTOLIN HFA) 108 (90 Base) MCG/ACT inhaler Inhale 2 puffs into the lungs every 6 (six) hours as needed for wheezing or shortness of breath. Patient not taking: Reported on 08/04/2021 02/08/20   Kayleen Memos, DO  amLODipine (NORVASC) 5 MG tablet Take 1 tablet (5 mg total) by mouth daily. 08/08/21   Lavina Hamman, MD  ascorbic acid (VITAMIN C) 500 MG tablet Take 500 mg by mouth daily.    [provider]  ASPIRIN 81 PO Take 81 mg by mouth daily.    [provider]  Blood Glucose Monitoring Suppl (ONETOUCH VERIO) w/Device KIT See admin instructions. 02/14/20   [provider]  ezetimibe-simvastatin (VYTORIN) 10-40 MG tablet Take 1 tablet by mouth daily. PATIENT MUST SCHEDULE APPOINTMENT FOR FUTURE REFILLS. FIRST ATTEMPT Patient not taking: Reported on 08/04/2021 01/15/21   Pixie Casino, MD  ferrous sulfate 325 (65 FE) MG tablet Take 1 tablet (325 mg total) by mouth daily with breakfast. 08/08/21   Lavina Hamman, MD  insulin glargine (LANTUS SOLOSTAR) 100 UNIT/ML Solostar Pen Inject 20 Units into the skin in the morning. 08/07/21   Lavina Hamman, MD  insulin lispro (HUMALOG KWIKPEN) 100 UNIT/ML KwikPen Inject 0-25 Units into the skin See admin instructions. Inject 0-25 units into the skin three to four times a day, PER SLIDING SCALE    [provider]  levothyroxine (SYNTHROID, LEVOTHROID) 200 MCG tablet Take 200 mcg by mouth daily before breakfast.     [provider]  losartan (COZAAR) 100 MG tablet Take 1 tablet (100 mg total) by mouth daily. 02/09/20 08/04/21  Kayleen Memos, DO  Mouthwash Compounding Base LIQD Swish and spit 15 mLs every 6 (six) hours as needed. Maalox 80 mL; nystatin 100,000 units/ML  80 mL; viscous lidocaine 2%- 2m Patient not taking: Reported on  07/22/2021 06/23/20   LChase Picket MD  nystatin (MYCOSTATIN) 100000 UNIT/ML suspension Take 5 mLs (500,000 Units total) by mouth 4 (four) times daily. Patient not taking: Reported on 07/22/2021 08/06/20   HLuna Fuse MD  [facility]  01/25/20   [provider]  pantoprazole (PROTONIX) 40 MG tablet Take 1 tablet (40 mg total) by mouth daily at 6 (six) AM. Patient taking differently: Take 40 mg by mouth daily before breakfast. 01/08/18   Regalado, Belkys A, MD  polyethylene glycol (MIRALAX / GLYCOLAX) 17 g packet Take 17 g by mouth daily as needed for mild constipation. 08/07/21   PLavina Hamman MD    Family History Family History  Problem Relation Age of Onset   Lung cancer Father    Cancer Father    Diabetes Father  Other Mother        childbirth - enlarged heart   Cancer Paternal Grandmother    Diabetes Paternal Grandmother    Mental illness Paternal Grandmother    Hypertension Paternal Grandfather    Colon cancer Neg Hx     Social History Social History   Tobacco Use   Smoking status: Never   Smokeless tobacco: Never  Vaping Use   Vaping Use: Never used  Substance Use Topics   Alcohol use: Yes    Alcohol/week: 0.0 standard drinks of alcohol    Comment: wine   Drug use: No     Allergies   Patient has no known allergies.   Review of Systems Review of Systems Per HPI  Physical Exam Triage Vital Signs ED Triage Vitals  Enc Vitals Group     BP 03/29/22 1251 (!) 164/89     Pulse Rate 03/29/22 1251 91     Resp 03/29/22 1251 16     Temp 03/29/22 1251 98.8 F (37.1 C)     Temp Source 03/29/22 1251 Oral     SpO2 03/29/22 1251 95 %     Weight --      Height --      Head Circumference --      Peak Flow --      Pain Score 03/29/22 1253 5     Pain Loc --      Pain Edu? --      Excl. in [place]? --    No data found.  Updated Vital Signs BP (!) 164/89 (BP Location: Right Arm)   Pulse 91   Temp 98.8 F (37.1 C) (Oral)   Resp 16    SpO2 95%   Visual Acuity Right Eye Distance:   Left Eye Distance:   Bilateral Distance:    Right Eye Near:   Left Eye Near:    Bilateral Near:     Physical Exam Constitutional:      General: She is not in acute distress.    Appearance: Normal appearance. She is not toxic-appearing or diaphoretic.  HENT:     Head: Normocephalic and atraumatic.     Right Ear: Tympanic membrane and ear canal normal.     Left Ear: Tympanic membrane and ear canal normal.     Nose: Congestion present.     Mouth/Throat:     Mouth: Mucous membranes are moist.     Pharynx: No posterior oropharyngeal erythema.  Eyes:     Extraocular Movements: Extraocular movements intact.     Conjunctiva/sclera: Conjunctivae normal.     Pupils: Pupils are equal, round, and reactive to light.  Cardiovascular:     Rate and Rhythm: Normal rate and regular rhythm.     Pulses: Normal pulses.     Heart sounds: Normal heart sounds.  Pulmonary:     Effort: Pulmonary effort is normal. No respiratory distress.     Breath sounds: Normal breath sounds. No stridor. No wheezing, rhonchi or rales.  Abdominal:     General: Abdomen is flat. Bowel sounds are normal.     Palpations: Abdomen is soft.  Musculoskeletal:        General: Normal range of motion.     Cervical back: Normal range of motion.  Skin:    General: Skin is warm and dry.  Neurological:     General: No focal deficit present.     Mental Status: She is alert and oriented to person, place, and time. Mental status is at  baseline.  Psychiatric:        Mood and Affect: Mood normal.        Behavior: Behavior normal.      UC Treatments / Results  Labs (all labs ordered are listed, but only abnormal results are displayed) Labs Reviewed  SARS CORONAVIRUS 2 (TAT 6-24 HRS)    EKG   Radiology No results found.  Procedures Procedures (including critical care time)  Medications Ordered in UC Medications - No data to display  Initial Impression /  Assessment and Plan / UC Course  I have reviewed the triage vital signs and the nursing notes.  Pertinent labs & imaging results that were available during my care of the patient were reviewed by me and considered in my medical decision making (see chart for details).     Patient presents with symptoms likely from a viral upper respiratory infection.  Do not suspect underlying cardiopulmonary process. Symptoms seem unlikely related to ACS, CHF or COPD exacerbations, pneumonia, pneumothorax. Patient is nontoxic appearing and not in need of emergent medical intervention.  COVID test pending.  Patient declined blood work to check kidney function for COVID antivirals.  Recommended symptom control with medications and supportive care.  Patient sent prescriptions.  Return if symptoms fail to improve in 1-2 weeks or you develop shortness of breath, chest pain, severe headache. Patient states understanding and is agreeable.  Discharged with PCP followup.  Final Clinical Impressions(s) / UC Diagnoses   Final diagnoses:  Viral upper respiratory tract infection with cough     Discharge Instructions      It appears that you have a viral upper respiratory infection which should run its course and self resolve with the help of  symptomatic treatment.  I have prescribed you 2 medications to help alleviate symptoms.  COVID test is pending.  We will call if it is positive.  Please follow-up if any symptoms persist or worsen.    ED Prescriptions     Medication Sig Dispense Auth. Provider   benzonatate (TESSALON) 100 MG capsule Take 1 capsule (100 mg total) by mouth every 8 (eight) hours as needed for cough. 21 capsule [place], Jud E, Isabel   fluticasone [facility]) 50 MCG/ACT nasal spray Place 1 spray into both nostrils daily. 16 g Teodora Medici, [place]      PDMP not reviewed this encounter.   Teodora Medici, [place] 03/29/22 1332

## 2022-03-29 NOTE — Discharge Instructions (Signed)
It appears that you have a viral upper respiratory infection which should run its course and self resolve with the help of  symptomatic treatment.  I have prescribed you 2 medications to help alleviate symptoms.  COVID test is pending.  We will call if it is positive.  Please follow-up if any symptoms persist or worsen.

## 2022-03-30 LAB — SARS CORONAVIRUS 2 (TAT 6-24 HRS): SARS Coronavirus 2: POSITIVE — AB

## 2022-03-31 ENCOUNTER — Emergency Department (HOSPITAL_BASED_OUTPATIENT_CLINIC_OR_DEPARTMENT_OTHER)
Admission: EM | Admit: 2022-03-31 | Discharge: 2022-03-31 | Disposition: A | Discharge status: Home / Self Care | Payer: Medicare Other | Attending: Emergency Medicine | Admitting: Emergency Medicine

## 2022-03-31 ENCOUNTER — Other Ambulatory Visit: Payer: Self-pay

## 2022-03-31 DIAGNOSIS — U071 COVID-19: Secondary | ICD-10-CM | POA: Diagnosis not present

## 2022-03-31 DIAGNOSIS — I129 Hypertensive chronic kidney disease with stage 1 through stage 4 chronic kidney disease, or unspecified chronic kidney disease: Secondary | ICD-10-CM | POA: Insufficient documentation

## 2022-03-31 DIAGNOSIS — E039 Hypothyroidism, unspecified: Secondary | ICD-10-CM | POA: Diagnosis not present

## 2022-03-31 DIAGNOSIS — E1122 Type 2 diabetes mellitus with diabetic chronic kidney disease: Secondary | ICD-10-CM | POA: Insufficient documentation

## 2022-03-31 DIAGNOSIS — R059 Cough, unspecified: Secondary | ICD-10-CM | POA: Diagnosis present

## 2022-03-31 DIAGNOSIS — N182 Chronic kidney disease, stage 2 (mild): Secondary | ICD-10-CM | POA: Insufficient documentation

## 2022-03-31 LAB — BASIC METABOLIC PANEL
Anion gap: 9 (ref 5–15)
BUN: 16 mg/dL (ref 8–23)
CO2: 25 mmol/L (ref 22–32)
Calcium: 9.4 mg/dL (ref 8.9–10.3)
Chloride: 101 mmol/L (ref 98–111)
Creatinine, Ser: 1.17 mg/dL — ABNORMAL HIGH (ref 0.44–1.00)
GFR, Estimated: 50 mL/min — ABNORMAL LOW (ref >60)
Glucose, Bld: 378 mg/dL — ABNORMAL HIGH (ref 70–99)
Potassium: 4.2 mmol/L (ref 3.5–5.1)
Sodium: 135 mmol/L (ref 135–145)

## 2022-03-31 LAB — GROUP A STREP BY PCR: Group A Strep by PCR: NOT DETECTED

## 2022-03-31 LAB — CBG MONITORING, ED: Glucose-Capillary: 366 mg/dL — ABNORMAL HIGH (ref 70–99)

## 2022-03-31 MED ORDER — PAXLOVID (150/100) 10 X 150 MG & 10 X 100MG PO TBPK: 2.0000 | ORAL_TABLET | Freq: Two times a day (BID) | ORAL | 0 refills | Status: AC

## 2022-03-31 NOTE — Discharge Instructions (Signed)
You were seen in the emerged part today with COVID-19.  I am starting you on Paxlovid which is an antiviral medication.  I would like for you to stop your cholesterol medication (statin) for the next 10 days while you are on this COVID medicine.  After 10 days you can resume your cholesterol treatment.  Return to the emergency department any new or suddenly worsening symptoms.

## 2022-03-31 NOTE — ED Notes (Signed)
Pt asked to have blood sugar checked. Pt's CBG result was 366. Informed Tim - RN.

## 2022-03-31 NOTE — ED Notes (Signed)
Pt given water, per Lucie Leather.

## 2022-03-31 NOTE — ED Triage Notes (Signed)
Patient arrives with complaints of being Covid Positive. Patient states that she is Diabetic and would like to discuss medications for covid treatment.    Reports cough and a headache.

## 2022-03-31 NOTE — ED Provider Notes (Signed)
Emergency Department Provider Note   I have reviewed the triage vital signs and the nursing notes.   HISTORY  Chief Complaint Cough, Headache, and Covid Positive   HPI [name] is a 72 y.o. female presents to the ED with flu-like symptoms. Patient developed symptoms in the last 2 days. No CP or SOB. Family with similar symptoms. No AMS. No vomiting or diarrhea. Patient has tested positive for COVID.   Past Medical History:  Diagnosis Date   Clotting disorder ([place])    Constipation    history of   Diabetes mellitus without complication ([place])    DVT (deep venous thrombosis) ([place])    2007   Factor V Leiden ([place])    deficiency    GERD (gastroesophageal reflux disease)    Hypertension    Hypothyroidism    Kidney disease    early stage   NSTEMI (non-ST elevated myocardial infarction) ([place])    2011 - possibly r/t DVT/embolization?    Sleep apnea    no c-pap    Review of Systems  Constitutional: No fever/chills. Positive fatigue.  Eyes: No visual changes. ENT: No sore throat. Positive congestion.  Cardiovascular: Denies chest pain. Respiratory: Denies shortness of breath. Positive cough.  Gastrointestinal: No abdominal pain.  No nausea, no vomiting.  No diarrhea.  No constipation. Genitourinary: Negative for dysuria. Musculoskeletal: Negative for back pain. Skin: Negative for rash. Neurological: Negative for headaches, focal weakness or numbness.  ____________________________________________   PHYSICAL EXAM:  VITAL SIGNS: ED Triage Vitals  Enc Vitals Group     BP 03/31/22 1047 (!) 195/83     Pulse Rate 03/31/22 1047 83     Resp 03/31/22 1047 18     Temp 03/31/22 1047 99.2 F (37.3 C)     Temp src --      SpO2 03/31/22 1047 96 %     Weight 03/31/22 1052 179 lb 14.3 oz (81.6 kg)     Height 03/31/22 1052 5' 4"$  (1.626 m)   Constitutional: Alert and oriented. Well appearing and in no acute distress. Eyes: Conjunctivae are normal. Head:  Atraumatic. Nose: No congestion/rhinnorhea. Mouth/Throat: Mucous membranes are moist.   Neck: No stridor.   Cardiovascular: Normal rate, regular rhythm. Good peripheral circulation. Grossly normal heart sounds.   Respiratory: Normal respiratory effort.  No retractions. Lungs CTAB. Gastrointestinal: Soft and nontender. No distention.  Musculoskeletal: No lower extremity tenderness nor edema. No gross deformities of extremities. Neurologic:  Normal speech and language. No gross focal neurologic deficits are appreciated.  Skin:  Skin is warm, dry and intact. No rash noted.  ____________________________________________   LABS (all labs ordered are listed, but only abnormal results are displayed)  Labs Reviewed  BASIC METABOLIC PANEL - Abnormal; Notable for the following components:      Result Value   Glucose, Bld 378 (*)    Creatinine, Ser 1.17 (*)    GFR, Estimated 50 (*)    All other components within normal limits  CBG MONITORING, ED - Abnormal; Notable for the following components:   Glucose-Capillary 366 (*)    All other components within normal limits  GROUP A STREP BY PCR    ____________________________________________   PROCEDURES  Procedure(s) performed:   Procedures  None ____________________________________________   INITIAL IMPRESSION / ASSESSMENT AND PLAN / ED COURSE  Pertinent labs & imaging results that were available during my care of the patient were reviewed by me and considered in my medical decision making (see chart for details).  This patient is Presenting for Evaluation of cough/congestion, which does require a range of treatment options, and is a complaint that involves a high risk of morbidity and mortality.  The Differential Diagnoses include COVID, Flu, CAP, CHF, etc.    Clinical Laboratory Tests Ordered, included trep negative. Mild CKD on chemistry.    Medical Decision Making: Summary:  Patient with COVID presents to consider antiviral  medication. Has symptoms and tested positive at an outside ED. CKD noted. Will send renal doses Paxlovid. Discussed Ed return precautions.    Patient's presentation is most consistent with acute, uncomplicated illness.   Disposition: discharge  ____________________________________________  FINAL CLINICAL IMPRESSION(S) / ED DIAGNOSES  Final diagnoses:  COVID-19     NEW OUTPATIENT MEDICATIONS STARTED DURING THIS VISIT:  Discharge Medication List as of 03/31/2022  1:55 PM     START taking these medications   Details  nirmatrelvir & ritonavir (PAXLOVID, 150/100,) 10 x 150 MG & 10 x 100MG TBPK Take 2 tablets by mouth 2 (two) times daily for 5 days., Starting Sun 03/31/2022, Until Fri 04/05/2022, Normal        Note:  This document was prepared using Dragon voice recognition software and may include unintentional dictation errors.  Nanda Quinton, MD, [facility] Emergency Medicine    Christoffer Currier, [facility], MD 04/03/22 1355

## 2022-07-24 ENCOUNTER — Ambulatory Visit (INDEPENDENT_AMBULATORY_CARE_PROVIDER_SITE_OTHER): Payer: Medicare Other | Admitting: Podiatry

## 2022-07-24 VITALS — BP 167/79

## 2022-07-24 DIAGNOSIS — E119 Type 2 diabetes mellitus without complications: Secondary | ICD-10-CM

## 2022-07-24 DIAGNOSIS — L84 Corns and callosities: Secondary | ICD-10-CM | POA: Diagnosis not present

## 2022-07-24 DIAGNOSIS — B351 Tinea unguium: Secondary | ICD-10-CM

## 2022-07-24 DIAGNOSIS — M2042 Other hammer toe(s) (acquired), left foot: Secondary | ICD-10-CM

## 2022-07-24 DIAGNOSIS — M79675 Pain in left toe(s): Secondary | ICD-10-CM

## 2022-07-24 DIAGNOSIS — M79674 Pain in right toe(s): Secondary | ICD-10-CM

## 2022-07-24 DIAGNOSIS — Z794 Long term (current) use of insulin: Secondary | ICD-10-CM | POA: Diagnosis not present

## 2022-07-24 DIAGNOSIS — M2041 Other hammer toe(s) (acquired), right foot: Secondary | ICD-10-CM

## 2022-07-24 NOTE — Progress Notes (Signed)
ANNUAL DIABETIC FOOT EXAM  Subjective: [name] presents today annual diabetic foot exam.  Chief Complaint  Patient presents with   Nail Problem    DFC,Referring Provider Hillery Aldo, NP,LOV:   ,BS:172,A1C:11.1      Patient confirms h/o diabetes.  Patient denies any h/o foot wounds.  Patient states she has h/o FBO of glass in her foot.   Risk factors:  blood clotting disorder, diabetes, h/o DVT, h/o MI, CAD, CKD, hyperlipidemia.  Hillery Aldo, NP is patient's PCP.  Past Medical History:  Diagnosis Date   Clotting disorder (HCC)    Constipation    history of   Diabetes mellitus without complication (HCC)    DVT (deep venous thrombosis) (HCC)    2007   Factor V Leiden (HCC)    deficiency    GERD (gastroesophageal reflux disease)    Hypertension    Hypothyroidism    Kidney disease    early stage   NSTEMI (non-ST elevated myocardial infarction) (HCC)    2011 - possibly r/t DVT/embolization?    Sleep apnea    no c-pap   Patient Active Problem List   Diagnosis Date Noted   Venous insufficiency (chronic) (peripheral) 10/31/2021   DKA (diabetic ketoacidosis) (HCC) 08/05/2021   Acute metabolic encephalopathy 08/04/2021   SIRS (systemic inflammatory response syndrome) (HCC) 08/04/2021   Lactic acidosis 08/04/2021   Acute anemia 08/04/2021   Acute renal failure superimposed on stage 3a chronic kidney disease (HCC) 08/04/2021   Idiopathic sleep related nonobstructive alveolar hypoventilation 10/10/2020   Noncompliance with treatment 10/10/2020   Cyst of right breast 09/20/2020   Acute non-ST segment elevation myocardial infarction (HCC) 09/19/2020   Deep venous thrombosis (HCC) 09/19/2020   Factor V Leiden mutation (HCC) 09/19/2020   GERD without esophagitis 09/19/2020   Sleep apnea 09/19/2020   Activated protein C resistance (HCC) 03/24/2020   Coronary artery disease 03/24/2020   Diabetic nephropathy (HCC) 03/24/2020   Hyperlipidemia 03/24/2020   Long  term (current) use of insulin (HCC) 03/24/2020   Old myocardial infarction 03/24/2020   Pneumonia due to COVID-19 virus    Chest pain 01/06/2018   Chronic kidney disease, stage 3a (HCC) 01/06/2018   History of DVT (deep vein thrombosis) 07/18/2017   Right leg pain 07/18/2017   Swelling of both lower extremities 07/18/2017   Dyslipidemia 07/18/2017   Insulin-requiring or dependent type II diabetes mellitus (HCC) 07/01/2016   Hx of non-ST elevation myocardial infarction (NSTEMI) 07/01/2016   Screening for lipid disorders 07/01/2016   Type 2 diabetes mellitus with ketoacidosis without coma, with long-term current use of insulin (HCC) 06/22/2015   COUGH 11/27/2007   PHLEBITIS, SUPERFICIAL VEINS, UPPER LIMB 09/29/2006   VENOUS INSUFFICIENCY, CHRONIC, RIGHT LEG 07/30/2006   Hypothyroidism 04/17/2006   Essential hypertension 04/17/2006   DEEP VEIN THROMBOPHLEBITIS, LEG 04/17/2006   Past Surgical History:  Procedure Laterality Date   CARDIAC CATHETERIZATION  10/21/2009   mild bridging(?) segment in mid LAD, otherwise normal coronaries (Dr. Ellene Route)   COLONOSCOPY     INGUINAL HERNIA REPAIR     when 6 months old   LOWER EXTREMITY ARTERIAL DOPPLER  2011   normal LEA   SLEEP STUDY  2011   AHI 8.5 and REM AHI 18   TRANSTHORACIC ECHOCARDIOGRAM  2011   EF 55-60%, trivial MR, TR, pulm valve regurg   TUBAL LIGATION  1995   UPPER GASTROINTESTINAL ENDOSCOPY     Current Outpatient Medications on File Prior to Visit  Medication Sig Dispense Refill  acetaminophen (TYLENOL) 500 MG tablet Take 500-1,000 mg by mouth every 6 (six) hours as needed for mild pain or headache.     albuterol (VENTOLIN HFA) 108 (90 Base) MCG/ACT inhaler Inhale 2 puffs into the lungs every 6 (six) hours as needed for wheezing or shortness of breath. (Patient not taking: Reported on 08/04/2021) 8 g 0   amLODipine (NORVASC) 5 MG tablet Take 1 tablet (5 mg total) by mouth daily. 30 tablet 0   ascorbic acid (VITAMIN C) 500 MG  tablet Take 500 mg by mouth daily.     ASPIRIN 81 PO Take 81 mg by mouth daily.     benzonatate (TESSALON) 100 MG capsule Take 1 capsule (100 mg total) by mouth every 8 (eight) hours as needed for cough. 21 capsule 0   Blood Glucose Monitoring Suppl (ONETOUCH VERIO) w/Device KIT See admin instructions.     ezetimibe-simvastatin (VYTORIN) 10-40 MG tablet Take 1 tablet by mouth daily. PATIENT MUST SCHEDULE APPOINTMENT FOR FUTURE REFILLS. FIRST ATTEMPT (Patient not taking: Reported on 08/04/2021) 30 tablet 0   ferrous sulfate 325 (65 FE) MG tablet Take 1 tablet (325 mg total) by mouth daily with breakfast. 30 tablet 0   fluticasone (FLONASE) 50 MCG/ACT nasal spray Place 1 spray into both nostrils daily. 16 g 0   insulin glargine (LANTUS SOLOSTAR) 100 UNIT/ML Solostar Pen Inject 20 Units into the skin in the morning. 15 mL 0   insulin lispro (HUMALOG KWIKPEN) 100 UNIT/ML KwikPen Inject 0-25 Units into the skin See admin instructions. Inject 0-25 units into the skin three to four times a day, PER SLIDING SCALE     levothyroxine (SYNTHROID, LEVOTHROID) 200 MCG tablet Take 200 mcg by mouth daily before breakfast.      losartan (COZAAR) 100 MG tablet Take 1 tablet (100 mg total) by mouth daily. 90 tablet 0   Mouthwash Compounding Base LIQD Swish and spit 15 mLs every 6 (six) hours as needed. Maalox 80 mL; nystatin 100,000 units/ML  80 mL; viscous lidocaine 2%- 80ml (Patient not taking: Reported on 07/22/2021) 240 mL 0   nystatin (MYCOSTATIN) 100000 UNIT/ML suspension Take 5 mLs (500,000 Units total) by mouth 4 (four) times daily. (Patient not taking: Reported on 07/22/2021) 60 mL 0   ONETOUCH ULTRA test strip      pantoprazole (PROTONIX) 40 MG tablet Take 1 tablet (40 mg total) by mouth daily at 6 (six) AM. (Patient taking differently: Take 40 mg by mouth daily before breakfast.) 30 tablet 0   polyethylene glycol (MIRALAX / GLYCOLAX) 17 g packet Take 17 g by mouth daily as needed for mild constipation. 14 each 0    No current facility-administered medications on file prior to visit.    No Known Allergies Social History   Occupational History   Occupation: police department    Employer: [employer]  Tobacco Use   Smoking status: Never   Smokeless tobacco: Never  Vaping Use   Vaping Use: Never used  Substance and Sexual Activity   Alcohol use: Yes    Alcohol/week: 0.0 standard drinks of alcohol    Comment: wine   Drug use: No   Sexual activity: Not on file   Family History  Problem Relation Age of Onset   Lung cancer Father    Cancer Father    Diabetes Father    Other Mother        childbirth - enlarged heart   Cancer Paternal Grandmother    Diabetes Paternal Grandmother  Mental illness Paternal Grandmother    Hypertension Paternal Grandfather    Colon cancer Neg Hx    Immunization History  Administered Date(s) Administered   Td 05/24/2004   Tdap 05/23/2021     Review of Systems: Negative except as noted in the HPI.   Objective: Vitals:   07/24/22 0847  BP: (!) 167/79    AITANA BURRY is a pleasant 72 y.o. female in NAD. AAO X 3.  Vascular Examination: Capillary refill time immediate b/l. Vascular status intact b/l with palpable pedal pulses. Pedal hair absent b/l. No pain with calf compression b/l. Skin temperature gradient WNL b/l. No cyanosis or clubbing b/l. No ischemia or gangrene noted b/l.   Neurological Examination: Sensation grossly intact b/l with 10 gram monofilament. Vibratory sensation intact b/l.   Dermatological Examination: Pedal skin with normal turgor, texture and tone b/l.  No open wounds. No interdigital macerations.   Toenails 1-5 b/l thick, discolored, elongated with subungual debris and pain on dorsal palpation.   Hyperkeratotic lesion(s) submet head 1 b/l and submet head 5 b/l.  No erythema, no edema, no drainage, no fluctuance.  Musculoskeletal Examination: Muscle strength 5/5 to all lower extremity muscle groups bilaterally.  Limb length discrepancy of right lower extremity. Hammertoe(s) noted to the 2-5 left foot and 2-5 right foot.  Radiographs: None  Last A1c:      Latest Ref Rng & Units 08/05/2021    2:59 AM  Hemoglobin A1C  Hemoglobin-A1c 4.8 - 5.6 % 11.1     Lab Results  Component Value Date   HGBA1C 11.1 (H) 08/05/2021   ADA Risk Categorization: Low Risk :  Patient has all of the following: Intact protective sensation No prior foot ulcer  No severe deformity Pedal pulses present  Assessment: 1. Pain due to onychomycosis of toenails of both feet   2. Callus   3. Acquired hammertoes of both feet   4. Type 2 diabetes mellitus without complication, with long-term current use of insulin (HCC)   5. Encounter for diabetic foot exam [facility])     Plan: -Patient was evaluated and treated. All patient's and/or POA's questions/concerns answered on today's visit. -Diabetic foot examination performed today. -Continue diabetic foot care principles: inspect feet daily, monitor glucose as recommended by PCP and/or Endocrinologist, and follow prescribed diet per PCP, Endocrinologist and/or dietician. -Patient to continue soft, supportive shoe gear daily. -Toenails 1-5 b/l were debrided in length and girth with sterile nail nippers and dremel without iatrogenic bleeding.  -Callus(es) submet head 1 b/l and submet head 5 b/l pared utilizing sterile scalpel blade without complication or incident. Total number debrided =4. -Patient/POA to call should there be question/concern in the interim. Return in about 4 months (around 11/12/2022).  Freddie Breech, DPM

## 2022-07-28 ENCOUNTER — Encounter: Payer: Self-pay | Admitting: Podiatry

## 2022-08-30 IMAGING — CR DG CHEST 2V
2 series · 2 of 2 positions shown · non-contrast
Comparison: Chest x-ray 02/06/2020, CT chest 02/03/2020

CLINICAL DATA: chest pain

EXAM:
CHEST - 2 VIEW

[w chest pa]
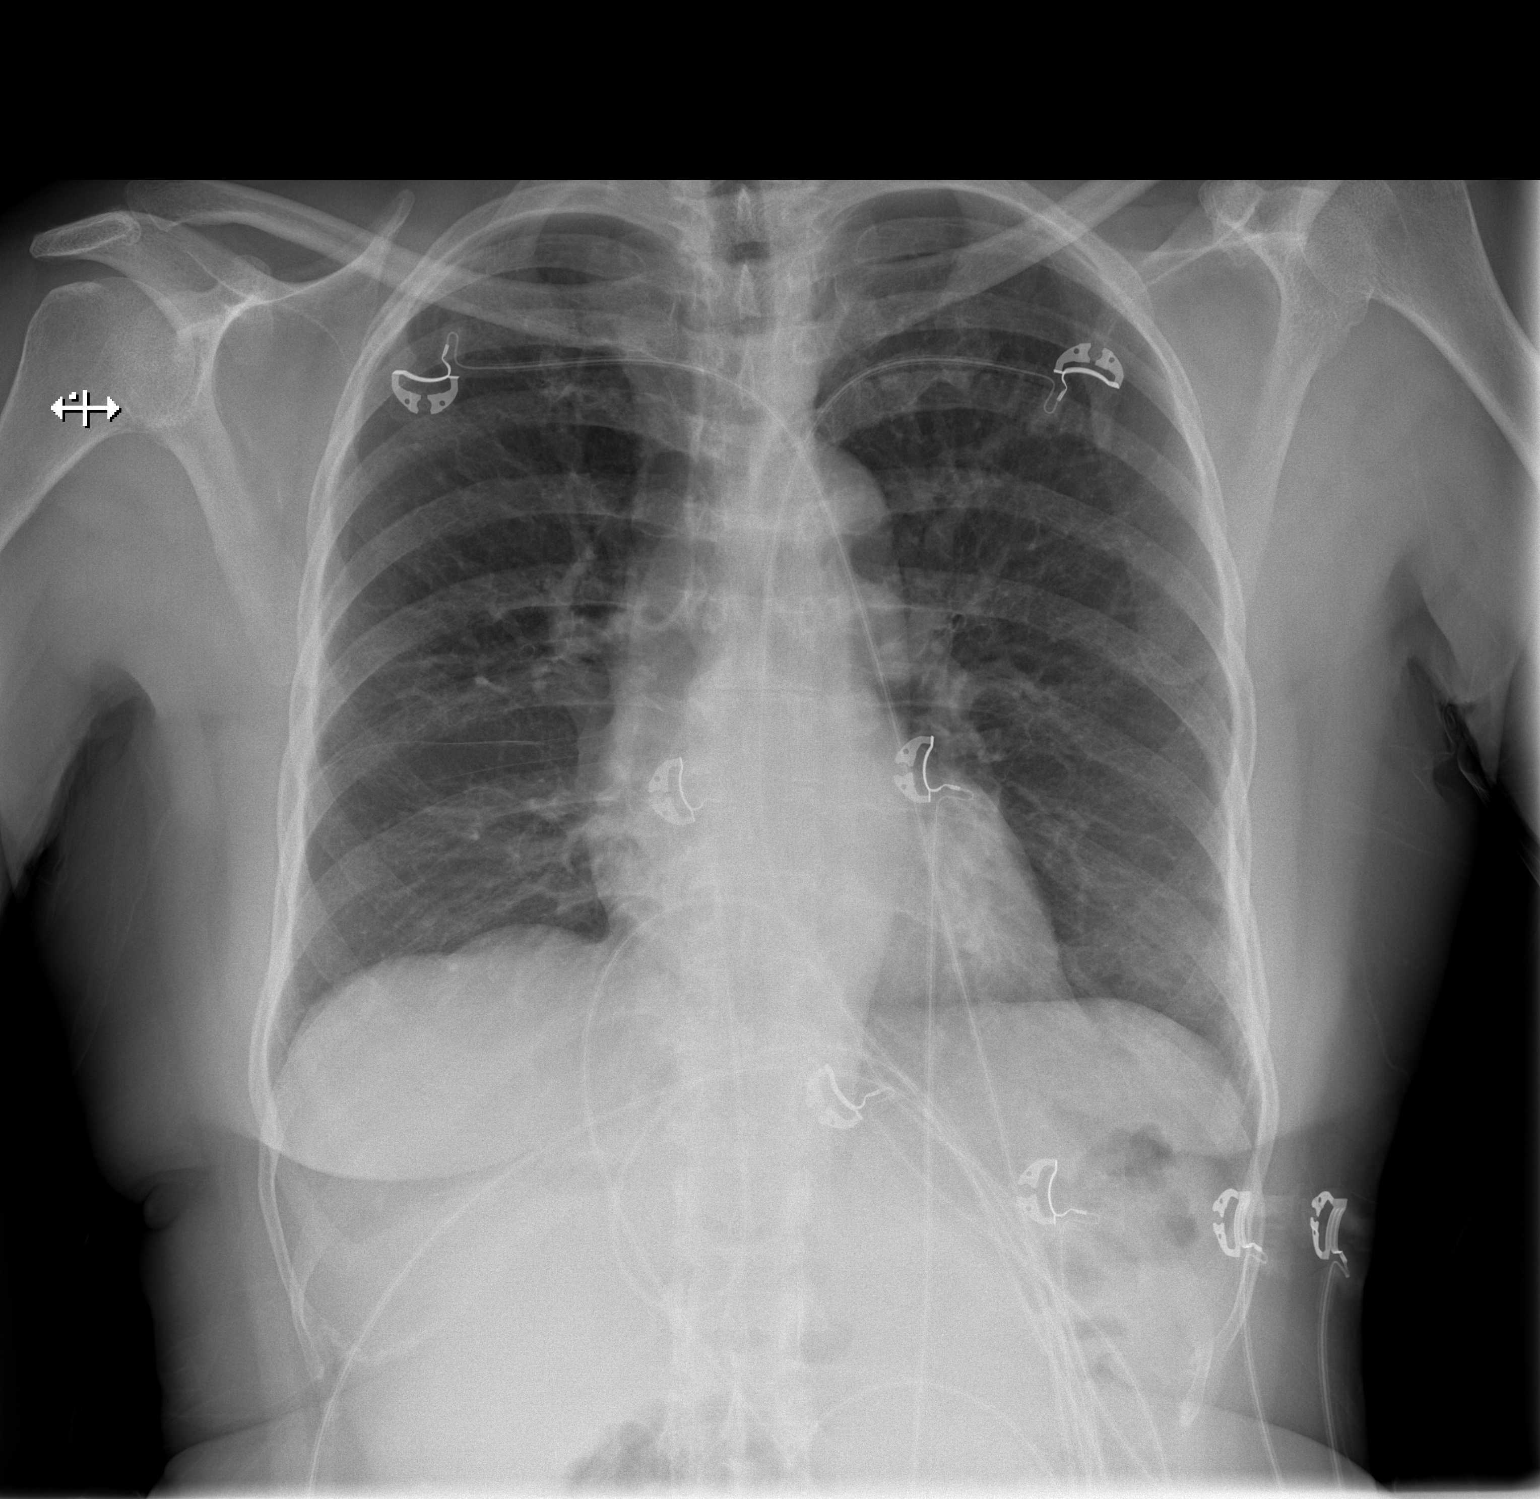

[w chest lat]
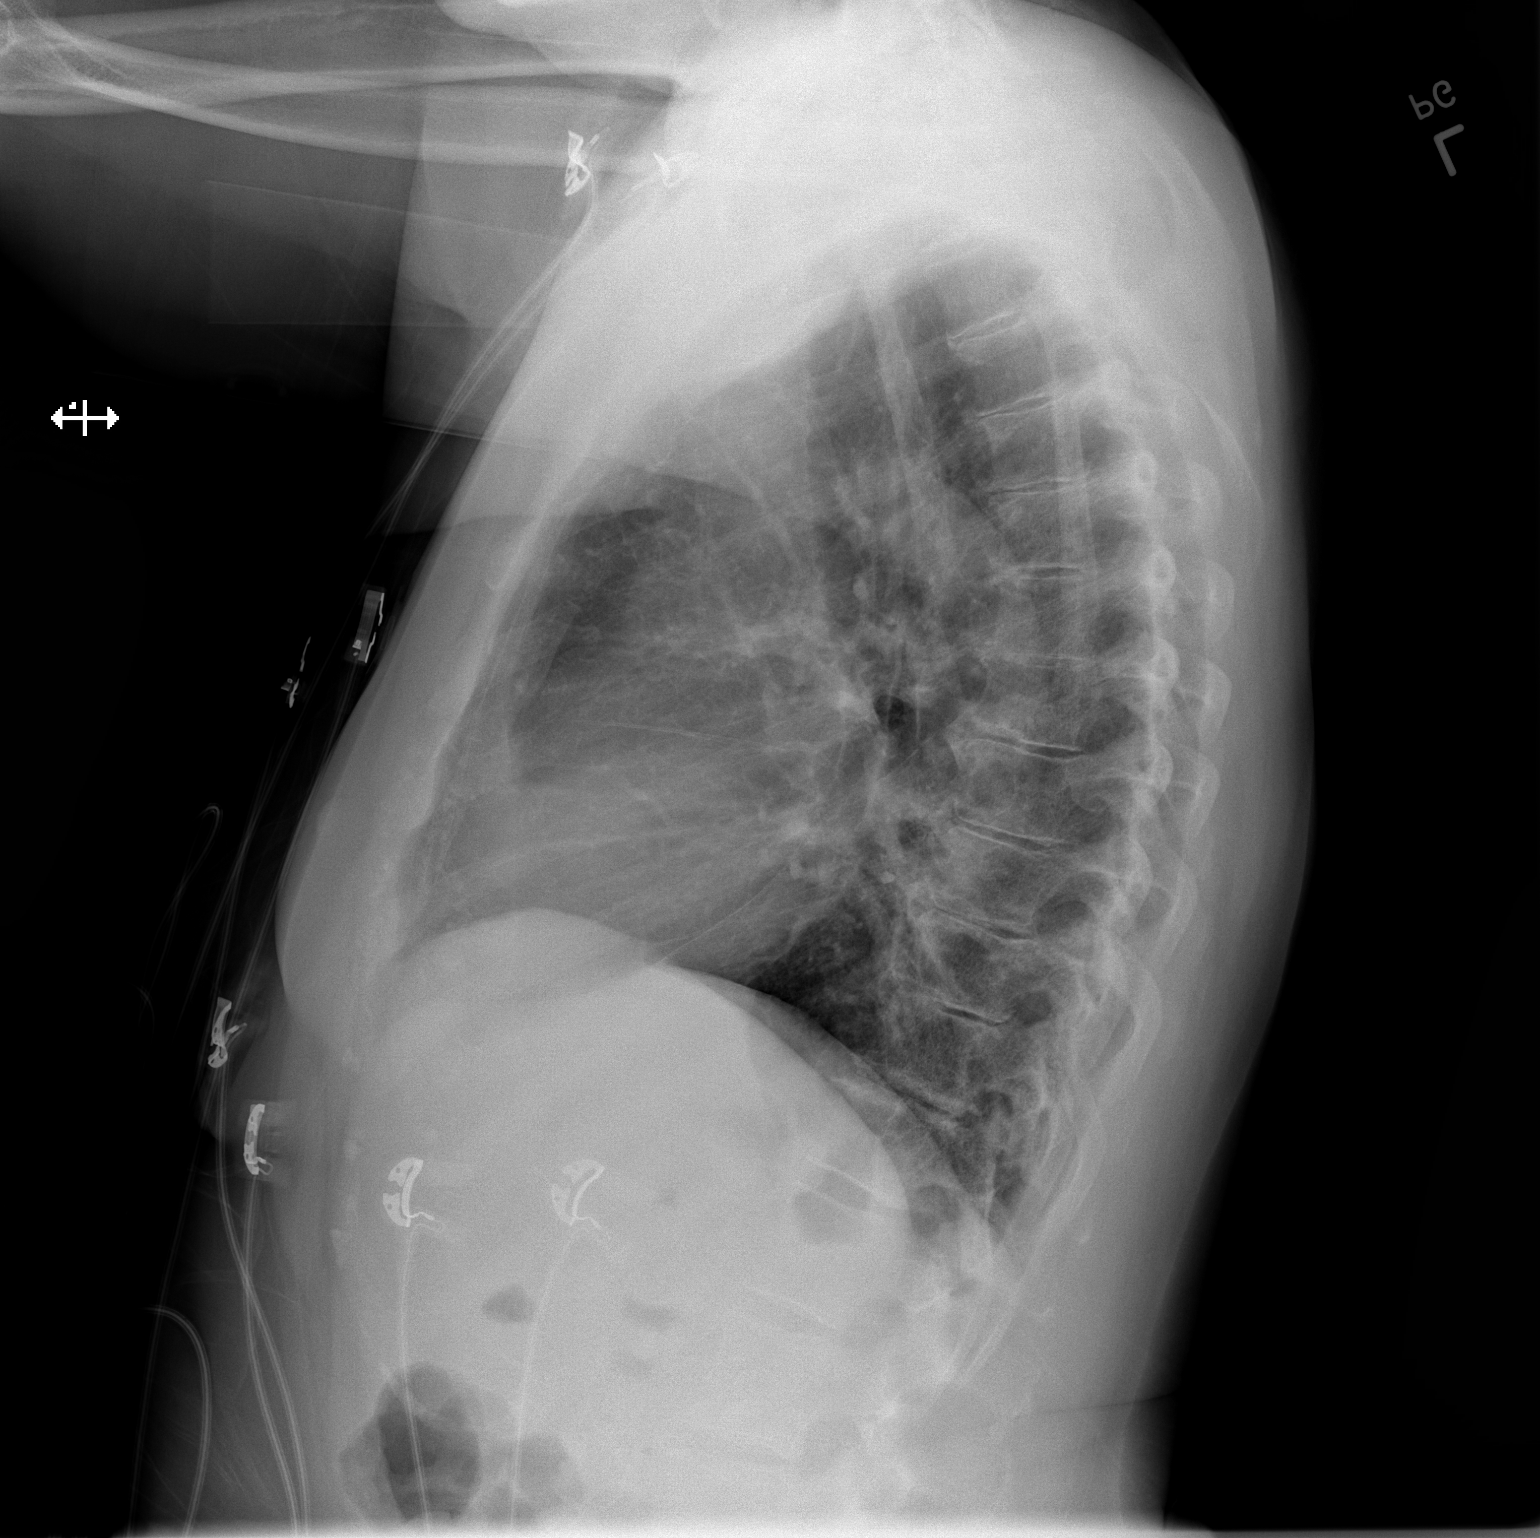

[2 of 2 positions shown; findings below may reference images not displayed]

FINDINGS: The heart and mediastinal contours are within normal limits.

No focal consolidation. No pulmonary edema. No pleural effusion. No
pneumothorax.

No acute osseous abnormality.
IMPRESSION: No active cardiopulmonary disease.

## 2022-09-13 IMAGING — CT CT ABD-PELV W/ CM
2 of 4 series · 14 of 36 positions shown, 17 images · IV contrast (agent unspecified)
Comparison: None Available.

CLINICAL DATA: Abdominal pain, acute, nonlocalized; Sepsis,
hyperglycemia, fever

EXAM:
CT CHEST, ABDOMEN, AND PELVIS WITH CONTRAST
TECHNIQUE: Multidetector CT imaging of the chest, abdomen and pelvis was
performed following the standard protocol during bolus
administration of intravenous contrast.

[Series 1: cap with · axial · 0.79mm/px · z∈[+822,+1362]mm · 11 of 122 slices shown, 14 images]
[im 7/122  mediastinal]
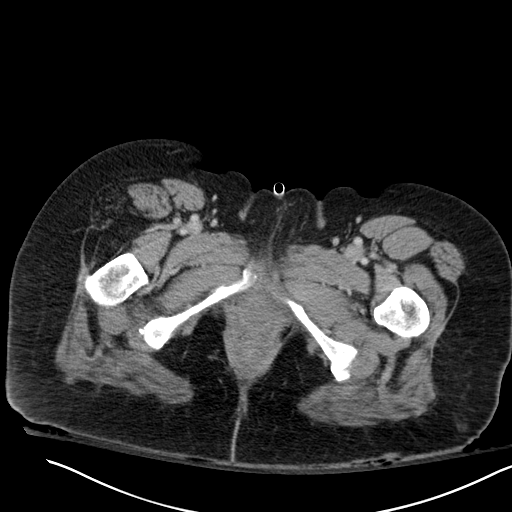
[im 7/122  lung]
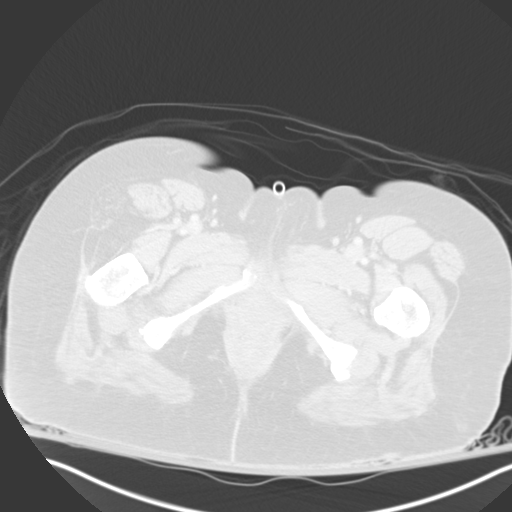
[im 19/122  lung]
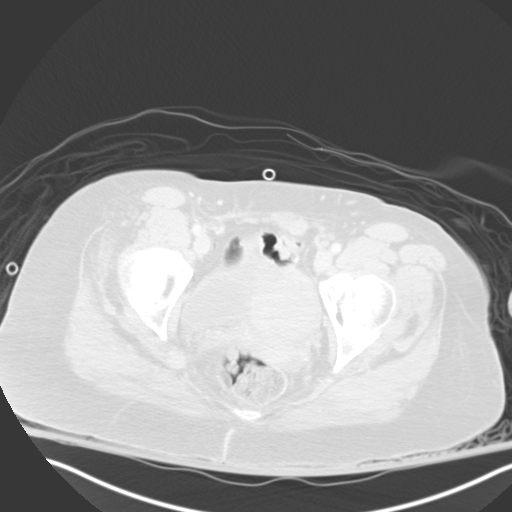
[im 31/122  lung]
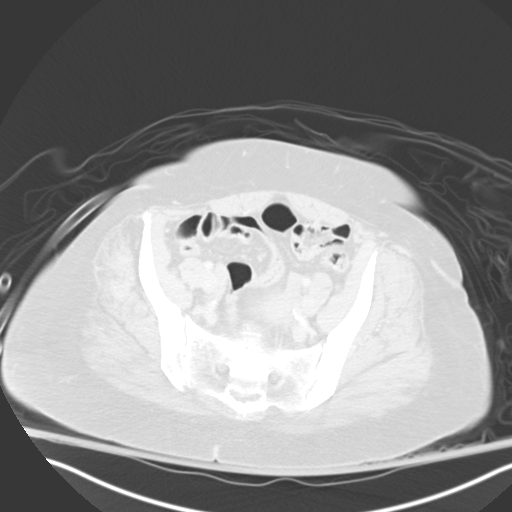
[im 43/122  lung]
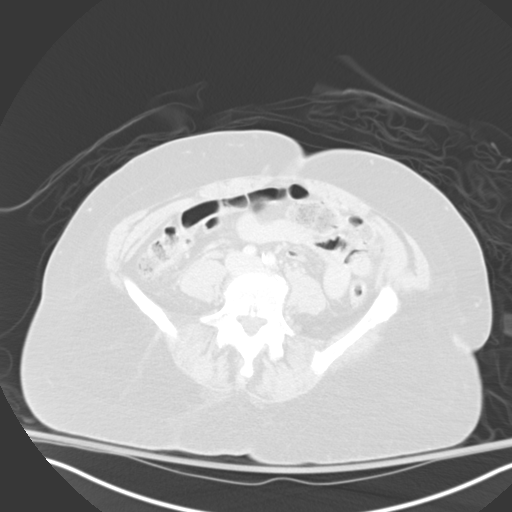
[im 49/122  mediastinal]
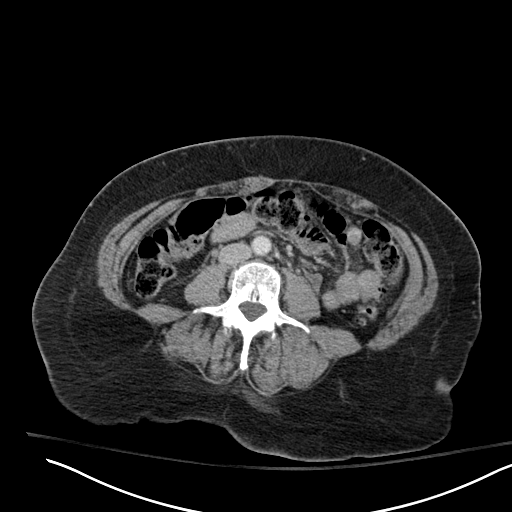
[im 49/122  lung]
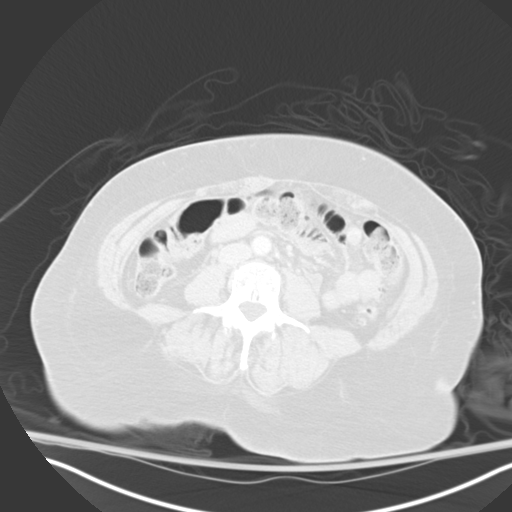
[im 61/122  lung]
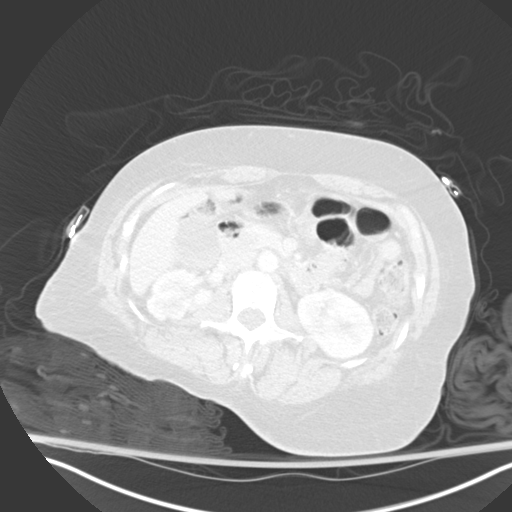
[im 73/122  lung]
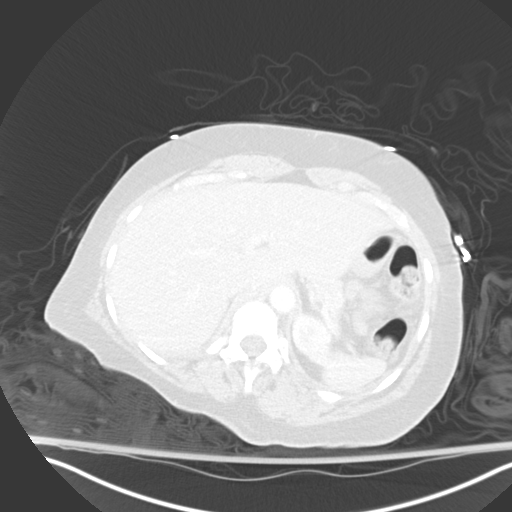
[im 79/122  lung]
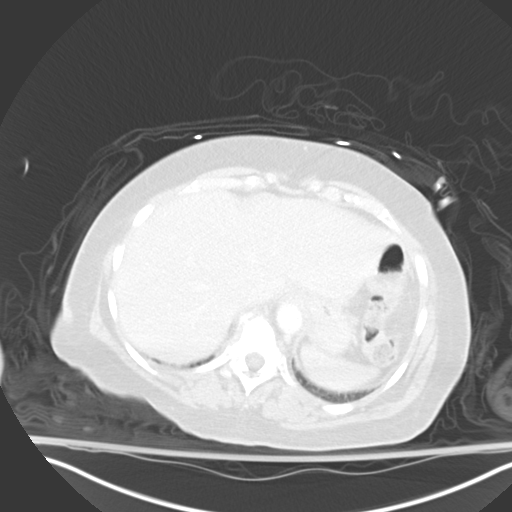
[im 91/122  mediastinal]
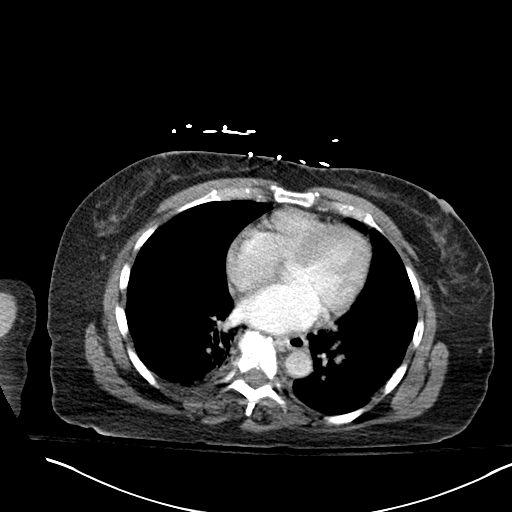
[im 91/122  lung]
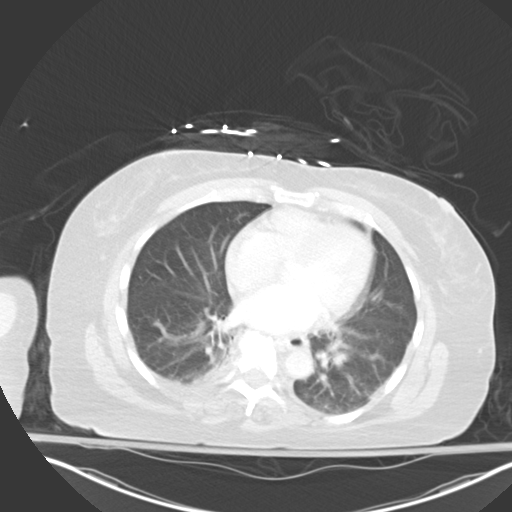
[im 103/122  lung]
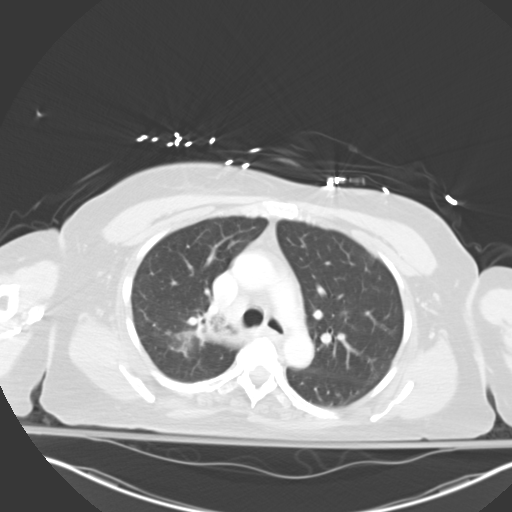
[im 115/122  lung]
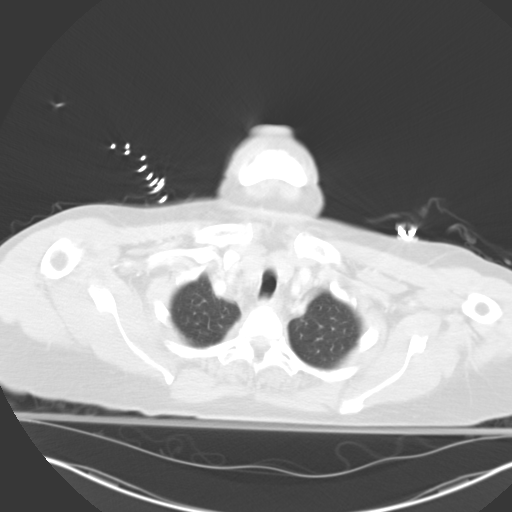

[Series 4: coronals · coronal · 0.86mm/px · 3 of 147 slices shown]
[im 30/147  lung]
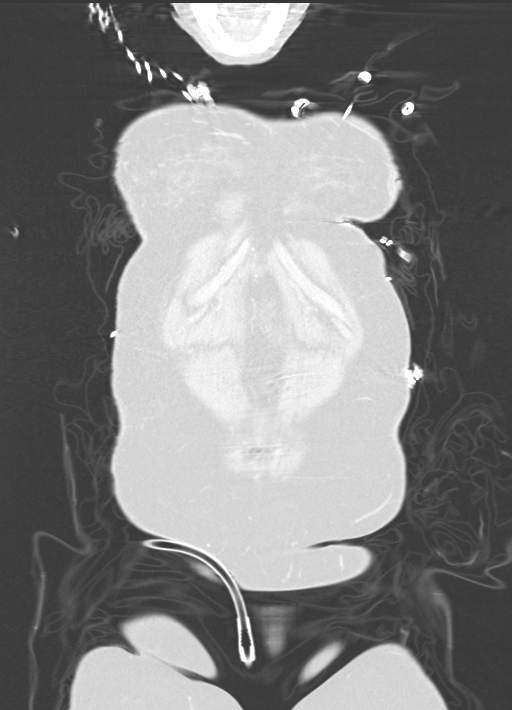
[im 59/147  lung]
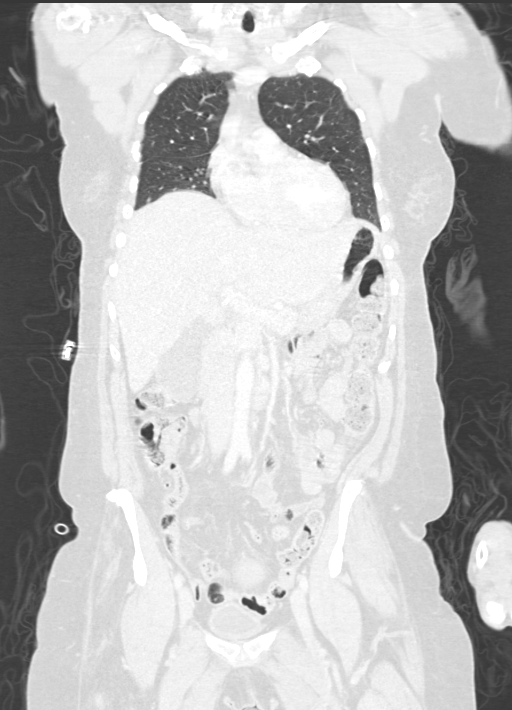
[im 88/147  lung]
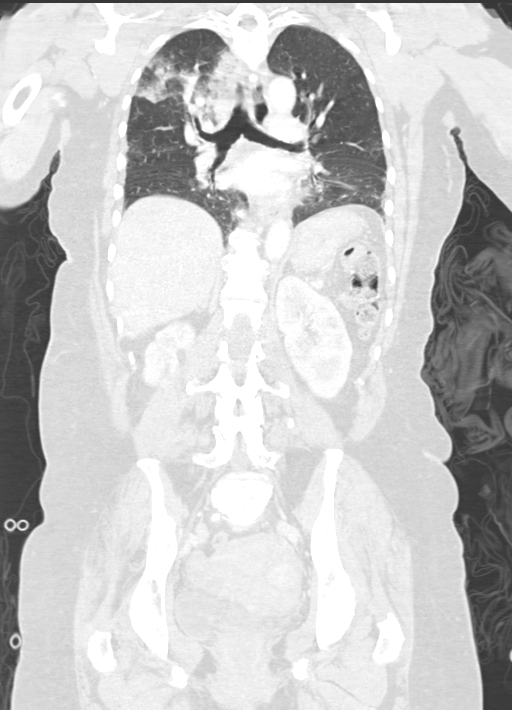

[14 of 36 positions shown; findings below may reference images not displayed]

RADIATION DOSE REDUCTION: This exam was performed according to the
departmental dose-optimization program which includes automated
exposure control, adjustment of the mA and/or kV according to
patient size and/or use of iterative reconstruction technique.

CONTRAST:  100mL OMNIPAQUE IOHEXOL 300 MG/ML  SOLN
FINDINGS: CT CHEST FINDINGS

Cardiovascular: No significant vascular findings. Normal heart size.
No pericardial effusion.

Mediastinum/Nodes: No enlarged mediastinal, hilar, or axillary lymph
nodes. Thyroid gland, trachea, and esophagus demonstrate no
significant findings.

Lungs/Pleura: There is focal consolidation within the right upper
lobe, likely infectious in the appropriate clinical setting. Minimal
scattered infiltrate is also seen within the peripheral left upper
lobe. Mild right basilar atelectasis. No pneumothorax or pleural
effusion. No central obstructing lesion.

Musculoskeletal: Osseous structures are age-appropriate. No acute
bone abnormality. No lytic or blastic bone lesion.

CT ABDOMEN PELVIS FINDINGS

Hepatobiliary: No focal liver abnormality is seen. No gallstones,
gallbladder wall thickening, or biliary dilatation.

Pancreas: Unremarkable

Spleen: Unremarkable

Adrenals/Urinary Tract: The adrenal glands are unremarkable. The
kidneys are normal in position. There is extensive cortical scarring
and moderate atrophy of the right kidney. No hydronephrosis,
perinephric fluid collections or inflammatory stranding, or
intrarenal or ureteral calculi are identified. Left kidney is
unremarkable. Bladder is unremarkable.

Stomach/Bowel: Stomach is within normal limits. Appendix appears
normal. No evidence of bowel wall thickening, distention, or
inflammatory changes.

Vascular/Lymphatic: Mild atherosclerotic calcification within the
iliac vasculature. No aortic aneurysm. No pathologic adenopathy
within the abdomen and pelvis.

Reproductive: Multiple lobulated enhancing masses are seen within
the uterus most in keeping with multiple uterine fibroids. No
adnexal masses.

Other: Tiny fat containing umbilical hernia.

Musculoskeletal: Osseous structures are age-appropriate. No acute
bone abnormality.
IMPRESSION: 1. Focal consolidation within the right upper lobe, likely
infectious in the appropriate clinical setting. Minimal scattered
infiltrate within the peripheral left upper lobe, also likely
infectious in nature. No central obstructing mass.
2. No acute intra-abdominal pathology identified.
3. Moderate atrophy and cortical scarring of the right kidney.
4. Multiple uterine fibroids.

## 2022-09-15 ENCOUNTER — Emergency Department (HOSPITAL_COMMUNITY)
Admission: EM | Admit: 2022-09-15 | Discharge: 2022-09-16 | Disposition: A | Discharge status: Home / Self Care | Payer: Medicare Other | Attending: Emergency Medicine | Admitting: Emergency Medicine

## 2022-09-15 ENCOUNTER — Other Ambulatory Visit: Payer: Self-pay

## 2022-09-15 DIAGNOSIS — R5383 Other fatigue: Secondary | ICD-10-CM | POA: Diagnosis not present

## 2022-09-15 DIAGNOSIS — Z7982 Long term (current) use of aspirin: Secondary | ICD-10-CM | POA: Diagnosis not present

## 2022-09-15 DIAGNOSIS — R531 Weakness: Secondary | ICD-10-CM | POA: Diagnosis not present

## 2022-09-15 DIAGNOSIS — E1165 Type 2 diabetes mellitus with hyperglycemia: Secondary | ICD-10-CM | POA: Insufficient documentation

## 2022-09-15 DIAGNOSIS — Z79899 Other long term (current) drug therapy: Secondary | ICD-10-CM | POA: Insufficient documentation

## 2022-09-15 DIAGNOSIS — I129 Hypertensive chronic kidney disease with stage 1 through stage 4 chronic kidney disease, or unspecified chronic kidney disease: Secondary | ICD-10-CM | POA: Insufficient documentation

## 2022-09-15 DIAGNOSIS — N189 Chronic kidney disease, unspecified: Secondary | ICD-10-CM | POA: Diagnosis not present

## 2022-09-15 DIAGNOSIS — E039 Hypothyroidism, unspecified: Secondary | ICD-10-CM | POA: Insufficient documentation

## 2022-09-15 DIAGNOSIS — R739 Hyperglycemia, unspecified: Secondary | ICD-10-CM

## 2022-09-15 LAB — CBC
HCT: 37.2 % (ref 36.0–46.0)
Hemoglobin: 12.5 g/dL (ref 12.0–15.0)
MCH: 28.3 pg (ref 26.0–34.0)
MCHC: 33.6 g/dL (ref 30.0–36.0)
MCV: 84.2 fL (ref 80.0–100.0)
Platelets: 321 10*3/uL (ref 150–400)
RBC: 4.42 MIL/uL (ref 3.87–5.11)
RDW: 14.8 % (ref 11.5–15.5)
WBC: 5.5 10*3/uL (ref 4.0–10.5)
nRBC: 0 % (ref 0.0–0.2)

## 2022-09-15 LAB — URINALYSIS, ROUTINE W REFLEX MICROSCOPIC
Bilirubin Urine: NEGATIVE
Glucose, UA: NEGATIVE mg/dL
Hgb urine dipstick: NEGATIVE
Ketones, ur: NEGATIVE mg/dL
Leukocytes,Ua: NEGATIVE
Nitrite: NEGATIVE
Protein, ur: 30 mg/dL — AB
Specific Gravity, Urine: 1.021 (ref 1.005–1.030)
pH: 5 (ref 5.0–8.0)

## 2022-09-15 LAB — CBG MONITORING, ED
Glucose-Capillary: 142 mg/dL — ABNORMAL HIGH (ref 70–99)
Glucose-Capillary: 180 mg/dL — ABNORMAL HIGH (ref 70–99)
Glucose-Capillary: 387 mg/dL — ABNORMAL HIGH (ref 70–99)
Glucose-Capillary: 449 mg/dL — ABNORMAL HIGH (ref 70–99)

## 2022-09-15 LAB — BASIC METABOLIC PANEL
Anion gap: 10 (ref 5–15)
BUN: 17 mg/dL (ref 8–23)
CO2: 23 mmol/L (ref 22–32)
Calcium: 9.2 mg/dL (ref 8.9–10.3)
Chloride: 103 mmol/L (ref 98–111)
Creatinine, Ser: 1.25 mg/dL — ABNORMAL HIGH (ref 0.44–1.00)
GFR, Estimated: 46 mL/min — ABNORMAL LOW (ref >60)
Glucose, Bld: 204 mg/dL — ABNORMAL HIGH (ref 70–99)
Potassium: 4.3 mmol/L (ref 3.5–5.1)
Sodium: 136 mmol/L (ref 135–145)

## 2022-09-15 NOTE — ED Triage Notes (Signed)
Pt arrived via POV. C/o hyperglycemia and fatigue for 1.5x months. AOx4  CBG in triage was 142.

## 2022-09-16 ENCOUNTER — Emergency Department (HOSPITAL_COMMUNITY): Payer: Medicare Other

## 2022-09-16 LAB — BASIC METABOLIC PANEL
Anion gap: 11 (ref 5–15)
BUN: 21 mg/dL (ref 8–23)
CO2: 19 mmol/L — ABNORMAL LOW (ref 22–32)
Calcium: 8.7 mg/dL — ABNORMAL LOW (ref 8.9–10.3)
Chloride: 102 mmol/L (ref 98–111)
Creatinine, Ser: 1.13 mg/dL — ABNORMAL HIGH (ref 0.44–1.00)
GFR, Estimated: 52 mL/min — ABNORMAL LOW (ref >60)
Glucose, Bld: 307 mg/dL — ABNORMAL HIGH (ref 70–99)
Potassium: 3.9 mmol/L (ref 3.5–5.1)
Sodium: 132 mmol/L — ABNORMAL LOW (ref 135–145)

## 2022-09-16 LAB — BASIC METABOLIC PANEL WITH GFR
Anion gap: 14 (ref 5–15)
BUN: 24 mg/dL — ABNORMAL HIGH (ref 8–23)
CO2: 15 mmol/L — ABNORMAL LOW (ref 22–32)
Calcium: 8.9 mg/dL (ref 8.9–10.3)
Chloride: 100 mmol/L (ref 98–111)
Creatinine, Ser: 1.15 mg/dL — ABNORMAL HIGH (ref 0.44–1.00)
GFR, Estimated: 51 mL/min — ABNORMAL LOW
Glucose, Bld: 472 mg/dL — ABNORMAL HIGH (ref 70–99)
Potassium: 4.7 mmol/L (ref 3.5–5.1)
Sodium: 129 mmol/L — ABNORMAL LOW (ref 135–145)

## 2022-09-16 LAB — TSH: TSH: 42.496 u[IU]/mL — ABNORMAL HIGH (ref 0.350–4.500)

## 2022-09-16 LAB — TROPONIN I (HIGH SENSITIVITY)
Troponin I (High Sensitivity): 6 ng/L
Troponin I (High Sensitivity): 5 ng/L

## 2022-09-16 LAB — T4, FREE: Free T4: 0.58 ng/dL — ABNORMAL LOW (ref 0.61–1.12)

## 2022-09-16 LAB — CBG MONITORING, ED: Glucose-Capillary: 325 mg/dL — ABNORMAL HIGH (ref 70–99)

## 2022-09-16 MED ORDER — INSULIN ASPART 100 UNIT/ML IJ SOLN
10.0000 [IU] | Freq: Once | INTRAMUSCULAR | Status: AC
  Administered 2022-09-16: 10 [IU] via SUBCUTANEOUS
  Filled 2022-09-16: qty 0.1

## 2022-09-16 MED ORDER — LACTATED RINGERS IV BOLUS
1000.0000 mL | Freq: Once | INTRAVENOUS | Status: AC
  Administered 2022-09-16: 1000 mL via INTRAVENOUS

## 2022-09-16 MED ORDER — INSULIN GLARGINE-YFGN 100 UNIT/ML ~~LOC~~ SOLN
45.0000 [IU] | Freq: Once | SUBCUTANEOUS | Status: AC
  Administered 2022-09-16: 45 [IU] via SUBCUTANEOUS
  Filled 2022-09-16: qty 0.45

## 2022-09-16 MED ORDER — LEVOTHYROXINE SODIUM 25 MCG PO TABS: 25.0000 ug | ORAL_TABLET | Freq: Every day | ORAL | 0 refills | Status: DC

## 2022-09-16 NOTE — ED Notes (Signed)
Pt said that their on BP medication and their not sure if they took it or not.

## 2022-09-16 NOTE — ED Provider Notes (Signed)
[place] EMERGENCY DEPARTMENT AT [facility] Provider Note   CSN: [id number] Arrival date & time: 09/15/22  1827     History  Chief Complaint  Patient presents with   Hyperglycemia   Fatigue    [name] is a 72 y.o. female.  Patient with a history of hypertension, diabetes, previous DVT not on anticoagulation, hypothyroidism, constipation, CKD presenting with fatigue as well as fluctuating blood sugar for the past 1 month.  States she she has not felt well for over a month with increased fatigue.  States when she goes to bed her sugar is well-controlled but when she wakes up it is over 3 or 400.  She takes Lantus 45 units at night and states compliance.  Also using sliding scale insulin with meals.  No recent medication changes.  States she feels generally weak and fatigued.  Denies abdominal pain, nausea, vomiting, chest pain or shortness of breath.  No pain with urination or blood in the urine.  No fever, cough, runny nose or sore throat.  No recent medication changes or changes to her diet.  No fever.  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  The history is provided by the patient.  Hyperglycemia Associated symptoms: fatigue and weakness   Associated symptoms: no chest pain, no dizziness, no dysuria, no fever, no nausea, no shortness of breath and no vomiting        Home Medications Prior to Admission medications   Medication Sig Start Date End Date Taking? Authorizing Provider  acetaminophen (TYLENOL) 500 MG tablet Take 500-1,000 mg by mouth every 6 (six) hours as needed for mild pain or headache.    [provider]  albuterol (VENTOLIN HFA) 108 (90 Base) MCG/ACT inhaler Inhale 2 puffs into the lungs every 6 (six) hours as needed for wheezing or shortness of breath. Patient not taking: Reported on 08/04/2021 02/08/20   Darlin Drop, DO  amLODipine (NORVASC) 5 MG tablet Take 1 tablet (5 mg total) by  mouth daily. 08/08/21   Rolly Salter, MD  ascorbic acid (VITAMIN C) 500 MG tablet Take 500 mg by mouth daily.    [provider]  ASPIRIN 81 PO Take 81 mg by mouth daily.    [provider]  benzonatate (TESSALON) 100 MG capsule Take 1 capsule (100 mg total) by mouth every 8 (eight) hours as needed for cough. 03/29/22   Gustavus Bryant, FNP  Blood Glucose Monitoring Suppl (ONETOUCH VERIO) w/Device KIT See admin instructions. 02/14/20   [provider]  ezetimibe-simvastatin (VYTORIN) 10-40 MG tablet Take 1 tablet by mouth daily. PATIENT MUST SCHEDULE APPOINTMENT FOR FUTURE REFILLS. FIRST ATTEMPT Patient not taking: Reported on 08/04/2021 01/15/21   Chrystie Nose, MD  ferrous sulfate 325 (65 FE) MG tablet Take 1 tablet (325 mg total) by mouth daily with breakfast. 08/08/21   Rolly Salter, MD  fluticasone (FLONASE) 50 MCG/ACT nasal spray Place 1 spray into both nostrils daily. 03/29/22   Gustavus Bryant, FNP  insulin glargine (LANTUS SOLOSTAR) 100 UNIT/ML Solostar Pen Inject 20 Units into the skin in the morning. 08/07/21   Rolly Salter, MD  insulin lispro (HUMALOG KWIKPEN) 100 UNIT/ML KwikPen Inject 0-25 Units into the skin See admin instructions. Inject 0-25 units into the skin three to four times a day, PER SLIDING SCALE    [provider]  levothyroxine (SYNTHROID, LEVOTHROID) 200 MCG tablet Take 200 mcg  by mouth daily before breakfast.     [provider]  losartan (COZAAR) 100 MG tablet Take 1 tablet (100 mg total) by mouth daily. 02/09/20 08/04/21  Darlin Drop, DO  Mouthwash Compounding Base LIQD Swish and spit 15 mLs every 6 (six) hours as needed. Maalox 80 mL; nystatin 100,000 units/ML  80 mL; viscous lidocaine 2%- 80ml Patient not taking: Reported on 07/22/2021 06/23/20   Merrilee Jansky, MD  nystatin (MYCOSTATIN) 100000 UNIT/ML suspension Take 5 mLs (500,000 Units total) by mouth 4 (four) times daily. Patient not taking: Reported on 07/22/2021  08/06/20   Cheryll Cockayne, MD  [facility] ULTRA test strip  01/25/20   [provider]  pantoprazole (PROTONIX) 40 MG tablet Take 1 tablet (40 mg total) by mouth daily at 6 (six) AM. Patient taking differently: Take 40 mg by mouth daily before breakfast. 01/08/18   Regalado, Belkys A, MD  polyethylene glycol (MIRALAX / GLYCOLAX) 17 g packet Take 17 g by mouth daily as needed for mild constipation. 08/07/21   Rolly Salter, MD      Allergies    Patient has no known allergies.    Review of Systems   Review of Systems  Constitutional:  Positive for fatigue. Negative for activity change, appetite change and fever.  HENT:  Negative for congestion and rhinorrhea.   Respiratory:  Negative for cough, chest tightness and shortness of breath.   Cardiovascular:  Negative for chest pain.  Gastrointestinal:  Negative for abdominal distention, nausea and vomiting.  Genitourinary:  Negative for dysuria and hematuria.  Musculoskeletal:  Negative for arthralgias and myalgias.  Skin:  Negative for wound.  Neurological:  Positive for weakness and light-headedness. Negative for dizziness, numbness and headaches.   all other systems are negative except as noted in the HPI and PMH.    Physical Exam Updated Vital Signs BP (!) 180/87   Pulse 78   Temp 97.7 F (36.5 C) (Oral)   Resp 18   Ht 5\' 4"  (1.626 m)   Wt 95.3 kg   SpO2 99%   BMI 36.05 kg/m  Physical Exam Vitals and nursing note reviewed.  Constitutional:      General: She is not in acute distress.    Appearance: She is well-developed.  HENT:     Head: Normocephalic and atraumatic.     Mouth/Throat:     Pharynx: No oropharyngeal exudate.  Eyes:     Conjunctiva/sclera: Conjunctivae normal.     Pupils: Pupils are equal, round, and reactive to light.  Neck:     Comments: No meningismus. Cardiovascular:     Rate and Rhythm: Normal rate and regular rhythm.     Heart sounds: Normal heart sounds. No murmur heard. Pulmonary:      Effort: Pulmonary effort is normal. No respiratory distress.     Breath sounds: Normal breath sounds.  Abdominal:     Palpations: Abdomen is soft.     Tenderness: There is no abdominal tenderness. There is no guarding or rebound.  Musculoskeletal:        General: No tenderness. Normal range of motion.     Cervical back: Normal range of motion and neck supple.  Skin:    General: Skin is warm.  Neurological:     Mental Status: She is alert and oriented to person, place, and time.     Cranial Nerves: No cranial nerve deficit.     Motor: No abnormal muscle tone.     Coordination: Coordination normal.  Comments:  5/5 strength throughout. CN 2-12 intact.Equal grip strength.   Psychiatric:        Behavior: Behavior normal.     ED Results / Procedures / Treatments   Labs (all labs ordered are listed, but only abnormal results are displayed) Labs Reviewed  BASIC METABOLIC PANEL - Abnormal; Notable for the following components:      Result Value   Glucose, Bld 204 (*)    Creatinine, Ser 1.25 (*)    GFR, Estimated 46 (*)    All other components within normal limits  URINALYSIS, ROUTINE W REFLEX MICROSCOPIC - Abnormal; Notable for the following components:   Protein, ur 30 (*)    Bacteria, UA RARE (*)    All other components within normal limits  TSH - Abnormal; Notable for the following components:   TSH 42.496 (*)    All other components within normal limits  BASIC METABOLIC PANEL - Abnormal; Notable for the following components:   Sodium 129 (*)    CO2 15 (*)    Glucose, Bld 472 (*)    BUN 24 (*)    Creatinine, Ser 1.15 (*)    GFR, Estimated 51 (*)    All other components within normal limits  T4, FREE - Abnormal; Notable for the following components:   Free T4 0.58 (*)    All other components within normal limits  BASIC METABOLIC PANEL - Abnormal; Notable for the following components:   Sodium 132 (*)    CO2 19 (*)    Glucose, Bld 307 (*)    Creatinine, Ser 1.13 (*)     Calcium 8.7 (*)    GFR, Estimated 52 (*)    All other components within normal limits  CBG MONITORING, ED - Abnormal; Notable for the following components:   Glucose-Capillary 142 (*)    All other components within normal limits  CBG MONITORING, ED - Abnormal; Notable for the following components:   Glucose-Capillary 180 (*)    All other components within normal limits  CBG MONITORING, ED - Abnormal; Notable for the following components:   Glucose-Capillary 449 (*)    All other components within normal limits  CBG MONITORING, ED - Abnormal; Notable for the following components:   Glucose-Capillary 387 (*)    All other components within normal limits  CBG MONITORING, ED - Abnormal; Notable for the following components:   Glucose-Capillary 325 (*)    All other components within normal limits  CBC  T3  TROPONIN I (HIGH SENSITIVITY)  TROPONIN I (HIGH SENSITIVITY)    EKG EKG Interpretation Date/Time:  Monday September 16 2022 00:33:05 EDT Ventricular Rate:  78 PR Interval:  160 QRS Duration:  93 QT Interval:  424 QTC Calculation: 483 R Axis:   54  Text Interpretation: Sinus rhythm No significant change was found Confirmed by Glynn Octave [phone]) on 09/16/2022 1:59:52 AM  Radiology DG Chest 2 View  Result Date: 09/16/2022 CLINICAL DATA:  Weakness EXAM: CHEST - 2 VIEW COMPARISON:  Radiograph and CT 08/04/2021 FINDINGS: The heart size and mediastinal contours are within normal limits. Bibasilar atelectasis/scarring. No pleural effusion or pneumothorax. The visualized skeletal structures are unremarkable. IMPRESSION: No active cardiopulmonary disease. Electronically Signed   By: Minerva Fester M.D.   On: 09/16/2022 02:08    Procedures Procedures    Medications Ordered in ED Medications  lactated ringers bolus 1,000 mL (has no administration in time range)  insulin glargine-yfgn (SEMGLEE) injection 45 Units (has no administration in time range)  ED Course/ Medical  Decision Making/ A&P                             Medical Decision Making Amount and/or Complexity of Data Reviewed Labs: ordered. Decision-making details documented in ED Course. Radiology: ordered and independent interpretation performed. Decision-making details documented in ED Course. ECG/medicine tests: ordered and independent interpretation performed. Decision-making details documented in ED Course.  Risk Prescription drug management.   1 month of fatigue with hyperglycemia.  Vital stable, no distress, nonfocal neurological exam.  No fever.  On arrival she is hyperglycemic but not in DKA.  Will give her home dose of Lantus which she missed tonight  IVF given. CXR negative for infiltrate. UA negative for infection, negative for ketones.   On recheck, blood sugar has worsened to 472, bicarb worsened to 15. Additional IVF and insulin given.   TSH 42. FT4 pending.  Hypothyroidism likely explains patient's fatigue. She states compliance with her 200 mcg synthroid. Will increased to 225 mcg and refer to endocrinology.   Blood sugar improved to 307, bicarb to 19. Anion gap normal. No evidence of DKA.   Patient tolerating PO and ambulatory. Troponin negative x2. Low suspicion for ACS.  Advised followup with PCP for further insulin adjustments as well as thyroid medication adjustments.  Endocrinology referral given as well.  Recheck TSH later this week.   Return precautions discussed.         Final Clinical Impression(s) / ED Diagnoses Final diagnoses:  Hyperglycemia  Hypothyroidism, unspecified type    Rx / DC Orders ED Discharge Orders     None         Lillien Petronio, Jeannett Senior, MD 09/16/22 [phone]

## 2022-09-16 NOTE — Discharge Instructions (Addendum)
Your TSH is higher than usual.  This means that you need to take more Synthroid.  We are increasing your dose from 200 mcg to 225 mcg.  You should take 25 mcg in addition to 200 mcg.  Follow-up with your endocrinologist and primary care doctor for further adjustments of your thyroid medication as well as your insulin. Your blood sugar is high but there is no evidence of DKA.  Keep a record of your blood pressure and blood sugar intake and Keep a record of your blood sugar on a regular basis.  Follow-up with your doctor.  Return to the ED with new or worsening symptoms.

## 2022-11-12 ENCOUNTER — Ambulatory Visit (INDEPENDENT_AMBULATORY_CARE_PROVIDER_SITE_OTHER): Payer: Medicare Other | Admitting: Podiatry

## 2022-11-12 ENCOUNTER — Encounter: Payer: Self-pay | Admitting: Podiatry

## 2022-11-12 DIAGNOSIS — Z794 Long term (current) use of insulin: Secondary | ICD-10-CM

## 2022-11-12 DIAGNOSIS — M79674 Pain in right toe(s): Secondary | ICD-10-CM

## 2022-11-12 DIAGNOSIS — E119 Type 2 diabetes mellitus without complications: Secondary | ICD-10-CM | POA: Diagnosis not present

## 2022-11-12 DIAGNOSIS — B351 Tinea unguium: Secondary | ICD-10-CM

## 2022-11-12 DIAGNOSIS — M79675 Pain in left toe(s): Secondary | ICD-10-CM | POA: Diagnosis not present

## 2022-11-13 NOTE — Progress Notes (Signed)
Subjective:  Patient ID: [name], female    DOB: Mar 01, 1950,  MRN: [id number]  [name] presents to clinic today for preventative diabetic foot care and painful elongated mycotic toenails 1-5 bilaterally which are tender when wearing enclosed shoe gear. Pain is relieved with periodic professional debridement. She is requesting heel lift to go in her shoe for her shorter leg. Chief Complaint  Patient presents with   Diabetes    [facility] BS - 137 A1C - 8.9 LVPCP - 08/2022   New problem(s): None.   PCP is Hillery Aldo, NP.  No Known Allergies  Review of Systems: Negative except as noted in the HPI.  Objective:  There were no vitals filed for this visit. [name] is a pleasant 72 y.o. female WD, WN in NAD. AAO x 3.  Vascular Examination: Capillary refill time immediate b/l. Vascular status intact b/l with palpable pedal pulses. Pedal hair absent b/l. No pain with calf compression b/l. Skin temperature gradient WNL b/l. No cyanosis or clubbing b/l. No ischemia or gangrene noted b/l.   Neurological Examination: Sensation grossly intact b/l with 10 gram monofilament. Vibratory sensation intact b/l.   Dermatological Examination: Pedal skin with normal turgor, texture and tone b/l.  No open wounds. No interdigital macerations.   Toenails 1-5 b/l thick, discolored, elongated with subungual debris and pain on dorsal palpation.   Resolved hyperkeratotic lesion(s) submet head 1 b/l and submet head 5 b/l.    Musculoskeletal Examination: Muscle strength 5/5 to all lower extremity muscle groups bilaterally. Limb length discrepancy of right lower extremity. Hammertoe(s) noted to the 2-5 left foot and 2-5 right foot.  Radiographs: None  Assessment/Plan: 1. Pain due to onychomycosis of toenails of both feet   2. Type 2 diabetes mellitus without complication, with long-term current use of insulin (HCC)     -Consent given for treatment as described below: -Examined  patient. -Dispensed felt heel lift. Will check with O&P and see if we have cork heel lifts which will not compress under patient's weight. -Patient to continue soft, supportive shoe gear daily. -Toenails 1-5 b/l were debrided in length and girth with sterile nail nippers and dremel without iatrogenic bleeding.  -Patient/POA to call should there be question/concern in the interim.   Return in about 3 months (around 02/11/2023).  Freddie Breech, DPM

## 2023-01-01 ENCOUNTER — Other Ambulatory Visit: Payer: Self-pay

## 2023-01-01 ENCOUNTER — Emergency Department (HOSPITAL_BASED_OUTPATIENT_CLINIC_OR_DEPARTMENT_OTHER): Payer: Medicare Other

## 2023-01-01 ENCOUNTER — Encounter (HOSPITAL_BASED_OUTPATIENT_CLINIC_OR_DEPARTMENT_OTHER): Payer: Self-pay

## 2023-01-01 ENCOUNTER — Emergency Department (HOSPITAL_BASED_OUTPATIENT_CLINIC_OR_DEPARTMENT_OTHER)
Admission: EM | Admit: 2023-01-01 | Discharge: 2023-01-01 | Disposition: A | Discharge status: Home / Self Care | Payer: Medicare Other | Attending: Emergency Medicine | Admitting: Emergency Medicine

## 2023-01-01 DIAGNOSIS — I160 Hypertensive urgency: Secondary | ICD-10-CM | POA: Diagnosis not present

## 2023-01-01 DIAGNOSIS — E119 Type 2 diabetes mellitus without complications: Secondary | ICD-10-CM | POA: Diagnosis not present

## 2023-01-01 DIAGNOSIS — I2510 Atherosclerotic heart disease of native coronary artery without angina pectoris: Secondary | ICD-10-CM | POA: Insufficient documentation

## 2023-01-01 DIAGNOSIS — I10 Essential (primary) hypertension: Secondary | ICD-10-CM | POA: Diagnosis not present

## 2023-01-01 DIAGNOSIS — Z794 Long term (current) use of insulin: Secondary | ICD-10-CM | POA: Diagnosis not present

## 2023-01-01 DIAGNOSIS — R030 Elevated blood-pressure reading, without diagnosis of hypertension: Secondary | ICD-10-CM | POA: Diagnosis present

## 2023-01-01 DIAGNOSIS — Z79899 Other long term (current) drug therapy: Secondary | ICD-10-CM | POA: Diagnosis not present

## 2023-01-01 DIAGNOSIS — R519 Headache, unspecified: Secondary | ICD-10-CM | POA: Insufficient documentation

## 2023-01-01 LAB — CBC WITH DIFFERENTIAL/PLATELET
Abs Immature Granulocytes: 0.02 10*3/uL (ref 0.00–0.07)
Basophils Absolute: 0.1 10*3/uL (ref 0.0–0.1)
Basophils Relative: 1 %
Eosinophils Absolute: 0.2 10*3/uL (ref 0.0–0.5)
Eosinophils Relative: 3 %
HCT: 36.6 % (ref 36.0–46.0)
Hemoglobin: 12.3 g/dL (ref 12.0–15.0)
Immature Granulocytes: 0 %
Lymphocytes Relative: 35 %
Lymphs Abs: 2.8 10*3/uL (ref 0.7–4.0)
MCH: 28.2 pg (ref 26.0–34.0)
MCHC: 33.6 g/dL (ref 30.0–36.0)
MCV: 83.9 fL (ref 80.0–100.0)
Monocytes Absolute: 0.9 10*3/uL (ref 0.1–1.0)
Monocytes Relative: 11 %
Neutro Abs: 4 10*3/uL (ref 1.7–7.7)
Neutrophils Relative %: 50 %
Platelets: 293 10*3/uL (ref 150–400)
RBC: 4.36 MIL/uL (ref 3.87–5.11)
RDW: 14.3 % (ref 11.5–15.5)
WBC: 7.9 10*3/uL (ref 4.0–10.5)
nRBC: 0 % (ref 0.0–0.2)

## 2023-01-01 LAB — BASIC METABOLIC PANEL
Anion gap: 11 (ref 5–15)
BUN: 24 mg/dL — ABNORMAL HIGH (ref 8–23)
CO2: 22 mmol/L (ref 22–32)
Calcium: 10 mg/dL (ref 8.9–10.3)
Chloride: 100 mmol/L (ref 98–111)
Creatinine, Ser: 1.24 mg/dL — ABNORMAL HIGH (ref 0.44–1.00)
GFR, Estimated: 46 mL/min — ABNORMAL LOW (ref >60)
Glucose, Bld: 248 mg/dL — ABNORMAL HIGH (ref 70–99)
Potassium: 4.3 mmol/L (ref 3.5–5.1)
Sodium: 133 mmol/L — ABNORMAL LOW (ref 135–145)

## 2023-01-01 MED ORDER — CYCLOBENZAPRINE HCL 10 MG PO TABS: 10.0000 mg | ORAL_TABLET | Freq: Three times a day (TID) | ORAL | 0 refills | Status: DC | PRN

## 2023-01-01 MED ORDER — CLONIDINE HCL 0.1 MG PO TABS
0.1000 mg | ORAL_TABLET | Freq: Once | ORAL | Status: AC
  Administered 2023-01-01: 0.1 mg via ORAL
  Filled 2023-01-01: qty 1

## 2023-01-01 NOTE — ED Triage Notes (Signed)
Pt complaining of her blood pressure being up since Sunday, does take losartan 100 mg for it. She also is complaining of the right side of her neck hurting.

## 2023-01-01 NOTE — ED Provider Notes (Signed)
[place] EMERGENCY DEPARTMENT AT [facility] Provider Note   CSN: [id number] Arrival date & time: 01/01/23  0241     History  Chief Complaint  Patient presents with   Hypertension    [name] is a 72 y.o. female.  Patient is a 72 year old female with past medical history of hypertension, type 2 diabetes, coronary artery disease, chronic renal insufficiency, hyperlipidemia.  Patient presenting today for evaluation of elevated blood pressure.  She normally checks her blood pressure at home and has been found that it has been running in the 180s to 200s systolic range.  She also describes a pain to the right side of her neck that has been present since she woke up on Sunday morning.  She describes discomfort when she turns her head or palpates the area.  No injury or trauma.  She denies any weakness, numbness, or radiation of the pain into her arm.  The history is provided by the patient.       Home Medications Prior to Admission medications   Medication Sig Start Date End Date Taking? Authorizing Provider  acetaminophen (TYLENOL) 500 MG tablet Take 500-1,000 mg by mouth every 6 (six) hours as needed for mild pain or headache.    [provider]  albuterol (VENTOLIN HFA) 108 (90 Base) MCG/ACT inhaler Inhale 2 puffs into the lungs every 6 (six) hours as needed for wheezing or shortness of breath. Patient not taking: Reported on 08/04/2021 02/08/20   Darlin Drop, DO  amLODipine (NORVASC) 5 MG tablet Take 1 tablet (5 mg total) by mouth daily. Patient not taking: Reported on 09/16/2022 08/08/21   Rolly Salter, MD  Ascorbic Acid (VITAMIN C) 1000 MG tablet Take 1,000 mg by mouth daily.    [provider]  ASPIRIN 81 PO Take 81 mg by mouth daily.    [provider]  benzonatate (TESSALON) 100 MG capsule Take 1 capsule (100 mg total) by mouth every 8 (eight) hours as needed for cough. Patient not taking: Reported on 09/16/2022 03/29/22   Gustavus Bryant, FNP  Blood Glucose Monitoring Suppl (ONETOUCH VERIO) w/Device KIT See admin instructions. 02/14/20   [provider]  ezetimibe-simvastatin (VYTORIN) 10-40 MG tablet Take 1 tablet by mouth daily. PATIENT MUST SCHEDULE APPOINTMENT FOR FUTURE REFILLS. FIRST ATTEMPT Patient not taking: Reported on 08/04/2021 01/15/21   Chrystie Nose, MD  ferrous sulfate 325 (65 FE) MG tablet Take 1 tablet (325 mg total) by mouth daily with breakfast. Patient not taking: Reported on 09/16/2022 08/08/21   Rolly Salter, MD  fluticasone [facility]) 50 MCG/ACT nasal spray Place 1 spray into both nostrils daily. Patient taking differently: Place 1 spray into both nostrils daily as needed for allergies. 03/29/22   Gustavus Bryant, FNP  insulin glargine (LANTUS SOLOSTAR) 100 UNIT/ML Solostar Pen Inject 20 Units into the skin in the morning. Patient taking differently: Inject 45 Units into the skin at bedtime. 08/07/21   Rolly Salter, MD  insulin lispro (HUMALOG KWIKPEN) 100 UNIT/ML KwikPen Inject 0-25 Units into the skin See admin instructions. Inject 0-25 units into the skin three to four times a day, PER SLIDING SCALE    [provider]  levothyroxine (SYNTHROID) 25 MCG tablet Take 1 tablet (25 mcg total) by mouth daily before breakfast. 09/16/22   Rancour, Jeannett Senior, MD  levothyroxine (SYNTHROID, LEVOTHROID) 200 MCG tablet Take 200 mcg by mouth daily before breakfast.     [provider]  losartan (COZAAR) 100  MG tablet Take 1 tablet (100 mg total) by mouth daily. 02/09/20 08/04/21  Darlin Drop, DO  losartan (COZAAR) 100 MG tablet Take 100 mg by mouth daily.    [provider]  metoprolol succinate (TOPROL-XL) 25 MG 24 hr tablet Take 25 mg by mouth daily.    [provider]  Mouthwash Compounding Base LIQD Swish and spit 15 mLs every 6 (six) hours as needed. Maalox 80 mL; nystatin 100,000 units/ML  80 mL; viscous lidocaine 2%- 80ml Patient not taking: Reported on 07/22/2021  06/23/20   Merrilee Jansky, MD  nystatin (MYCOSTATIN) 100000 UNIT/ML suspension Take 5 mLs (500,000 Units total) by mouth 4 (four) times daily. Patient not taking: Reported on 07/22/2021 08/06/20   Cheryll Cockayne, MD  [facility] ULTRA test strip  01/25/20   [provider]  pantoprazole (PROTONIX) 40 MG tablet Take 1 tablet (40 mg total) by mouth daily at 6 (six) AM. Patient taking differently: Take 40 mg by mouth daily as needed (heartburn). 01/08/18   Regalado, Belkys A, MD  polyethylene glycol (MIRALAX / GLYCOLAX) 17 g packet Take 17 g by mouth daily as needed for mild constipation. 08/07/21   Rolly Salter, MD  zinc gluconate 50 MG tablet Take 50 mg by mouth daily.    [provider]      Allergies    Patient has no known allergies.    Review of Systems   Review of Systems  All other systems reviewed and are negative.   Physical Exam Updated Vital Signs BP (!) 201/74 (BP Location: Right Arm)   Pulse 81   Temp 98.2 F (36.8 C)   Resp 18   Ht 5\' 5"  (1.651 m)   Wt 93 kg   SpO2 99%   BMI 34.11 kg/m  Physical Exam Vitals and nursing note reviewed.  Constitutional:      General: She is not in acute distress.    Appearance: She is well-developed. She is not diaphoretic.  HENT:     Head: Normocephalic and atraumatic.  Eyes:     Extraocular Movements: Extraocular movements intact.     Pupils: Pupils are equal, round, and reactive to light.  Cardiovascular:     Rate and Rhythm: Normal rate and regular rhythm.     Heart sounds: No murmur heard.    No friction rub. No gallop.  Pulmonary:     Effort: Pulmonary effort is normal. No respiratory distress.     Breath sounds: Normal breath sounds. No wheezing.  Abdominal:     General: Bowel sounds are normal. There is no distension.     Palpations: Abdomen is soft.     Tenderness: There is no abdominal tenderness.  Musculoskeletal:        General: Normal range of motion.     Cervical back: Normal range of motion  and neck supple.  Skin:    General: Skin is warm and dry.  Neurological:     General: No focal deficit present.     Mental Status: She is alert and oriented to person, place, and time. Mental status is at baseline.     Cranial Nerves: No cranial nerve deficit.     Motor: No weakness.     ED Results / Procedures / Treatments   Labs (all labs ordered are listed, but only abnormal results are displayed) Labs Reviewed  BASIC METABOLIC PANEL  CBC WITH DIFFERENTIAL/PLATELET    EKG EKG Interpretation Date/Time:  Wednesday January 01 2023 05:20:16  EST Ventricular Rate:  73 PR Interval:  164 QRS Duration:  94 QT Interval:  444 QTC Calculation: 490 R Axis:   60  Text Interpretation: Sinus rhythm Borderline prolonged QT interval No significant change since 09/16/2022 Confirmed by Geoffery Lyons ([id number]) on 01/01/2023 5:24:36 AM  Radiology No results found.  Procedures Procedures    Medications Ordered in ED Medications  cloNIDine (CATAPRES) tablet 0.1 mg (has no administration in time range)    ED Course/ Medical Decision Making/ A&P  Patient is a 72 year old female presenting with elevated blood pressure, headache, and neck pain as described in the HPI.  Patient arrives here with stable vital signs and is afebrile.  Physical examination basically unremarkable.  Laboratory studies obtained including CBC and basic metabolic panel, both of which are unremarkable.  CT scan of the head shows no acute process.  The patient given 0.1 mg of clonidine with some improvement in her blood pressure.  At the time of this dictation it is now 150/63.  I feel as though she can be safely discharged with outpatient monitoring of her blood pressure.  Cause of the increase is unknown.  Patient to be discharged with Flexeril for her neck pain and follow-up as needed.  Final Clinical Impression(s) / ED Diagnoses Final diagnoses:  None    Rx / DC Orders ED Discharge Orders     None          Geoffery Lyons, MD 01/01/23 [phone]

## 2023-01-01 NOTE — Discharge Instructions (Signed)
Continue medications as previously prescribed.  Begin taking Flexeril as prescribed as needed for neck pain.  Keep a record of your blood pressures and take this with you to your next doctor's appointment for review.

## 2023-02-26 ENCOUNTER — Encounter: Payer: Self-pay | Admitting: Podiatry

## 2023-02-26 ENCOUNTER — Ambulatory Visit: Payer: Medicare Other | Admitting: Podiatry

## 2023-02-26 DIAGNOSIS — M79674 Pain in right toe(s): Secondary | ICD-10-CM

## 2023-02-26 DIAGNOSIS — B351 Tinea unguium: Secondary | ICD-10-CM | POA: Diagnosis not present

## 2023-02-26 DIAGNOSIS — E119 Type 2 diabetes mellitus without complications: Secondary | ICD-10-CM | POA: Diagnosis not present

## 2023-02-26 DIAGNOSIS — Z794 Long term (current) use of insulin: Secondary | ICD-10-CM

## 2023-02-26 DIAGNOSIS — M79675 Pain in left toe(s): Secondary | ICD-10-CM

## 2023-03-04 NOTE — Progress Notes (Signed)
  Subjective:  Patient ID: [name], female    DOB: 09-10-1950,  MRN: [id number]  73 y.o. female presents preventative diabetic foot care and painful thick toenails that are difficult to trim. Pain interferes with ambulation. Aggravating factors include wearing enclosed shoe gear. Pain is relieved with periodic professional debridement. Chief Complaint  Patient presents with   [facility]    RM#15 DFC  A1C 11 BS not taking this am.   New problem(s): None   PCP is Campbell Reynolds, NP.  No Known Allergies  Review of Systems: Negative except as noted in the HPI.   Objective:  [name] is a pleasant 73 y.o. female WD, WN in NAD. AAO x 3.  Vascular Examination: Vascular status intact b/l with palpable pedal pulses. CFT immediate b/l. Pedal hair present. No edema. No pain with calf compression b/l. Skin temperature gradient WNL b/l. No varicosities noted. No cyanosis or clubbing noted.  Neurological Examination: Sensation grossly intact b/l with 10 gram monofilament. Vibratory sensation intact b/l.  Dermatological Examination: Pedal skin with normal turgor, texture and tone b/l. No open wounds nor interdigital macerations noted. Toenails 1-5 b/l thick, discolored, elongated with subungual debris and pain on dorsal palpation. No hyperkeratotic lesions noted b/l.   Musculoskeletal Examination: Muscle strength 5/5 to b/l LE.  No pain, crepitus noted b/l. No gross pedal deformities. Patient ambulates independently without assistive aids.   Radiographs: None  Last A1c:       No data to display         Assessment:   1. Pain due to onychomycosis of toenails of both feet   2. Type 2 diabetes mellitus without complication, with long-term current use of insulin  [facility])    Plan:  -Patient was evaluated today. All questions/concerns addressed on today's visit. -Continue supportive shoe gear daily. -Mycotic toenails 1-5 bilaterally were debrided in length and girth with sterile  nail nippers and dremel without incident. -Patient/POA to call should there be question/concern in the interim.  Return in about 3 months (around 05/27/2023).  Delon LITTIE Merlin, DPM      [place] LOCATION: [address] [place] [address]                   Office [phone]   [facility] LOCATION: [address] [place], [place] [address] Office [phone]

## 2023-03-12 ENCOUNTER — Encounter (HOSPITAL_COMMUNITY): Payer: Self-pay

## 2023-03-12 ENCOUNTER — Ambulatory Visit (HOSPITAL_COMMUNITY)
Admission: RE | Admit: 2023-03-12 | Discharge: 2023-03-12 | Disposition: A | Discharge status: Home / Self Care | Payer: Medicare Other | Source: Ambulatory Visit | Attending: Emergency Medicine | Admitting: Emergency Medicine

## 2023-03-12 ENCOUNTER — Ambulatory Visit (INDEPENDENT_AMBULATORY_CARE_PROVIDER_SITE_OTHER): Payer: Medicare Other

## 2023-03-12 VITALS — BP 141/70 | HR 105 | Temp 98.4°F | Resp 16

## 2023-03-12 DIAGNOSIS — R0602 Shortness of breath: Secondary | ICD-10-CM

## 2023-03-12 DIAGNOSIS — B349 Viral infection, unspecified: Secondary | ICD-10-CM | POA: Diagnosis not present

## 2023-03-12 DIAGNOSIS — R112 Nausea with vomiting, unspecified: Secondary | ICD-10-CM

## 2023-03-12 DIAGNOSIS — R197 Diarrhea, unspecified: Secondary | ICD-10-CM | POA: Diagnosis not present

## 2023-03-12 LAB — POC COVID19/FLU A&B COMBO
Covid Antigen, POC: NEGATIVE
Influenza A Antigen, POC: NEGATIVE
Influenza B Antigen, POC: NEGATIVE

## 2023-03-12 LAB — POCT FASTING CBG KUC MANUAL ENTRY: POCT Glucose (KUC): 155 mg/dL — AB (ref 70–99)

## 2023-03-12 MED ORDER — ALBUTEROL SULFATE HFA 108 (90 BASE) MCG/ACT IN AERS
INHALATION_SPRAY | RESPIRATORY_TRACT | Status: AC
  Filled 2023-03-12: qty 6.7

## 2023-03-12 MED ORDER — BENZONATATE 200 MG PO CAPS: 200.0000 mg | ORAL_CAPSULE | Freq: Three times a day (TID) | ORAL | 0 refills | Status: DC

## 2023-03-12 MED ORDER — ONDANSETRON 4 MG PO TBDP
4.0000 mg | ORAL_TABLET | Freq: Once | ORAL | Status: AC
  Administered 2023-03-12: 4 mg via ORAL

## 2023-03-12 MED ORDER — ALBUTEROL SULFATE HFA 108 (90 BASE) MCG/ACT IN AERS
1.0000 | INHALATION_SPRAY | Freq: Once | RESPIRATORY_TRACT | Status: AC
  Administered 2023-03-12: 2 via RESPIRATORY_TRACT

## 2023-03-12 MED ORDER — ONDANSETRON 4 MG PO TBDP
ORAL_TABLET | ORAL | Status: AC
  Filled 2023-03-12: qty 1

## 2023-03-12 NOTE — ED Provider Notes (Signed)
[facility]    CSN: [id number] Arrival date & time: 03/12/23  1710      History   Chief Complaint Chief Complaint  Patient presents with   Emesis   Diarrhea    HPI REACHEL GUTHRIDGE is a 73 y.o. female.   Patient presents to clinic for nausea, vomiting, diarrhea, cough and chest congestion since this morning. Denies any fever. Feels short of breath with coughing.   Woke up and had vomiting and diarrhea this morning, 3 episodes each. Has not had any vomiting since 7:30 this morning. Has had tea and soup since then, held it down. No abdominal pain. No blood in stool or vomit.   No recent sick contacts.  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  The history is provided by the patient and medical records.  Emesis Diarrhea   Past Medical History:  Diagnosis Date   Clotting disorder (HCC)    Constipation    history of   Diabetes mellitus without complication (HCC)    DVT (deep venous thrombosis) (HCC)    2007   Factor V Leiden (HCC)    deficiency    GERD (gastroesophageal reflux disease)    Hypertension    Hypothyroidism    Kidney disease    early stage   NSTEMI (non-ST elevated myocardial infarction) (HCC)    2011 - possibly r/t DVT/embolization?    Sleep apnea    no c-pap    Patient Active Problem List   Diagnosis Date Noted   Venous insufficiency (chronic) (peripheral) 10/31/2021   Lower limb pain, inferior, left 10/31/2021   DKA (diabetic ketoacidosis) (HCC) 08/05/2021   Acute metabolic encephalopathy 08/04/2021   SIRS (systemic inflammatory response syndrome) (HCC) 08/04/2021   Lactic acidosis 08/04/2021   Acute anemia 08/04/2021   Acute renal failure superimposed on stage 3a chronic kidney disease (HCC) 08/04/2021   Idiopathic sleep related nonobstructive alveolar hypoventilation 10/10/2020   Noncompliance with treatment 10/10/2020   Cyst of right breast  09/20/2020   Acute non-ST segment elevation myocardial infarction (HCC) 09/19/2020   Deep venous thrombosis (HCC) 09/19/2020   Factor V Leiden mutation (HCC) 09/19/2020   GERD without esophagitis 09/19/2020   Sleep apnea 09/19/2020   Activated protein C resistance (HCC) 03/24/2020   Coronary artery disease 03/24/2020   Diabetic nephropathy (HCC) 03/24/2020   Hyperlipidemia 03/24/2020   Long term (current) use of insulin (HCC) 03/24/2020   Old myocardial infarction 03/24/2020   Pneumonia due to COVID-19 virus    Chest pain 01/06/2018   Chronic kidney disease, stage 3a (HCC) 01/06/2018   History of DVT (deep vein thrombosis) 07/18/2017   Right leg pain 07/18/2017   Swelling of both lower extremities 07/18/2017   Dyslipidemia 07/18/2017   Insulin-requiring or dependent type II diabetes mellitus (HCC) 07/01/2016   Hx of non-ST elevation myocardial infarction (NSTEMI) 07/01/2016   Screening for lipid disorders 07/01/2016   Type 2 diabetes mellitus with ketoacidosis without coma, with long-term current use of insulin (HCC) 06/22/2015   COUGH 11/27/2007   PHLEBITIS, SUPERFICIAL VEINS, UPPER LIMB 09/29/2006   Venous (peripheral) insufficiency 07/30/2006   Hypothyroidism 04/17/2006   Essential hypertension 04/17/2006   DEEP VEIN THROMBOPHLEBITIS, LEG 04/17/2006    Past Surgical History:  Procedure Laterality Date   CARDIAC CATHETERIZATION  10/21/2009   mild bridging(?) segment in mid LAD, otherwise normal coronaries (Dr. Ellene Route)   COLONOSCOPY  INGUINAL HERNIA REPAIR     when 6 months old   LOWER EXTREMITY ARTERIAL DOPPLER  2011   normal LEA   SLEEP STUDY  2011   AHI 8.5 and REM AHI 18   TRANSTHORACIC ECHOCARDIOGRAM  2011   EF 55-60%, trivial MR, TR, pulm valve regurg   TUBAL LIGATION  1995   UPPER GASTROINTESTINAL ENDOSCOPY      OB History   No obstetric history on file.      Home Medications    Prior to Admission medications   Medication Sig Start Date End Date  Taking? Authorizing Provider  benzonatate (TESSALON) 200 MG capsule Take 1 capsule (200 mg total) by mouth every 8 (eight) hours. 03/12/23  Yes Rinaldo Ratel, [place] N, FNP  acetaminophen (TYLENOL) 500 MG tablet Take 500-1,000 mg by mouth every 6 (six) hours as needed for mild pain or headache.    [provider]  albuterol (VENTOLIN HFA) 108 (90 Base) MCG/ACT inhaler Inhale 2 puffs into the lungs every 6 (six) hours as needed for wheezing or shortness of breath. 02/08/20   Darlin Drop, DO  amLODipine (NORVASC) 5 MG tablet Take 1 tablet (5 mg total) by mouth daily. 08/08/21   Rolly Salter, MD  Ascorbic Acid (VITAMIN C) 1000 MG tablet Take 1,000 mg by mouth daily.    [provider]  ASPIRIN 81 PO Take 81 mg by mouth daily.    [provider]  Blood Glucose Monitoring Suppl (ONETOUCH VERIO) w/Device KIT See admin instructions. 02/14/20   [provider]  cyclobenzaprine (FLEXERIL) 10 MG tablet Take 1 tablet (10 mg total) by mouth 3 (three) times daily as needed for muscle spasms. 01/01/23   Geoffery Lyons, MD  ezetimibe-simvastatin (VYTORIN) 10-40 MG tablet Take 1 tablet by mouth daily. PATIENT MUST SCHEDULE APPOINTMENT FOR FUTURE REFILLS. FIRST ATTEMPT 01/15/21   Chrystie Nose, MD  ferrous sulfate 325 (65 FE) MG tablet Take 1 tablet (325 mg total) by mouth daily with breakfast. 08/08/21   Rolly Salter, MD  fluticasone (FLONASE) 50 MCG/ACT nasal spray Place 1 spray into both nostrils daily. Patient taking differently: Place 1 spray into both nostrils daily as needed for allergies. 03/29/22   Gustavus Bryant, FNP  insulin glargine (LANTUS SOLOSTAR) 100 UNIT/ML Solostar Pen Inject 20 Units into the skin in the morning. Patient taking differently: Inject 45 Units into the skin at bedtime. 08/07/21   Rolly Salter, MD  insulin lispro (HUMALOG KWIKPEN) 100 UNIT/ML KwikPen Inject 0-25 Units into the skin See admin instructions. Inject 0-25 units into the skin three to  four times a day, PER SLIDING SCALE    [provider]  levothyroxine (SYNTHROID) 25 MCG tablet Take 1 tablet (25 mcg total) by mouth daily before breakfast. 09/16/22   Rancour, Jeannett Senior, MD  levothyroxine (SYNTHROID, LEVOTHROID) 200 MCG tablet Take 200 mcg by mouth daily before breakfast.     [provider]  losartan (COZAAR) 100 MG tablet Take 1 tablet (100 mg total) by mouth daily. 02/09/20 08/04/21  Darlin Drop, DO  losartan (COZAAR) 100 MG tablet Take 100 mg by mouth daily.    [provider]  metoprolol succinate (TOPROL-XL) 25 MG 24 hr tablet Take 25 mg by mouth daily.    [provider]  Mouthwash Compounding Base LIQD Swish and spit 15 mLs every 6 (six) hours as needed. Maalox 80 mL; nystatin 100,000 units/ML  80 mL; viscous lidocaine 2%- 80ml 06/23/20   Demaris Callander  O, MD  nystatin (MYCOSTATIN) 100000 UNIT/ML suspension Take 5 mLs (500,000 Units total) by mouth 4 (four) times daily. 08/06/20   Cheryll Cockayne, MD  [facility] ULTRA test strip  01/25/20   [provider]  pantoprazole (PROTONIX) 40 MG tablet Take 1 tablet (40 mg total) by mouth daily at 6 (six) AM. Patient taking differently: Take 40 mg by mouth daily as needed (heartburn). 01/08/18   Regalado, Belkys A, MD  polyethylene glycol (MIRALAX / GLYCOLAX) 17 g packet Take 17 g by mouth daily as needed for mild constipation. 08/07/21   Rolly Salter, MD  zinc gluconate 50 MG tablet Take 50 mg by mouth daily.    [provider]    Family History Family History  Problem Relation Age of Onset   Lung cancer Father    Cancer Father    Diabetes Father    Other Mother        childbirth - enlarged heart   Cancer Paternal Grandmother    Diabetes Paternal Grandmother    Mental illness Paternal Grandmother    Hypertension Paternal Grandfather    Colon cancer Neg Hx     Social History Social History   Tobacco Use   Smoking status: Never   Smokeless tobacco: Never  Vaping Use    Vaping status: Never Used  Substance Use Topics   Alcohol use: Yes    Alcohol/week: 0.0 standard drinks of alcohol    Comment: wine   Drug use: No     Allergies   Patient has no known allergies.   Review of Systems Review of Systems  Per HPI   Physical Exam Triage Vital Signs ED Triage Vitals  Encounter Vitals Group     BP 03/12/23 1737 (!) 141/70     Systolic BP Percentile --      Diastolic BP Percentile --      Pulse Rate 03/12/23 1737 (!) 105     Resp 03/12/23 1737 16     Temp 03/12/23 1737 98.4 F (36.9 C)     Temp Source 03/12/23 1737 Oral     SpO2 03/12/23 1737 95 %     Weight --      Height --      Head Circumference --      Peak Flow --      Pain Score 03/12/23 1740 0     Pain Loc --      Pain Education --      Exclude from Growth Chart --    No data found.  Updated Vital Signs BP (!) 141/70 (BP Location: Left Arm)   Pulse (!) 105   Temp 98.4 F (36.9 C) (Oral)   Resp 16   SpO2 95%   Visual Acuity Right Eye Distance:   Left Eye Distance:   Bilateral Distance:    Right Eye Near:   Left Eye Near:    Bilateral Near:     Physical Exam Vitals and nursing note reviewed.  Constitutional:      Appearance: Normal appearance.  HENT:     Head: Normocephalic and atraumatic.     Right Ear: External ear normal.     Left Ear: External ear normal.     Nose: Rhinorrhea present.     Mouth/Throat:     Mouth: Mucous membranes are moist.     Pharynx: Posterior oropharyngeal erythema present.  Eyes:     Conjunctiva/sclera: Conjunctivae normal.  Cardiovascular:     Rate and Rhythm: Normal  rate and regular rhythm.     Heart sounds: Normal heart sounds. No murmur heard. Pulmonary:     Effort: Pulmonary effort is normal.     Breath sounds: Wheezing and rhonchi present.  Musculoskeletal:        General: Normal range of motion.  Skin:    General: Skin is warm and dry.  Neurological:     General: No focal deficit present.     Mental Status: She is  alert and oriented to person, place, and time.  Psychiatric:        Mood and Affect: Mood normal.        Behavior: Behavior normal.      UC Treatments / Results  Labs (all labs ordered are listed, but only abnormal results are displayed) Labs Reviewed  POCT FASTING CBG [facility] MANUAL ENTRY - Abnormal; Notable for the following components:      Result Value   POCT Glucose ([facility]) 155 (*)    All other components within normal limits  POC COVID19/FLU A&B COMBO    EKG   Radiology DG Chest 2 View Result Date: 03/12/2023 CLINICAL DATA:  Short of breath, emesis, diarrhea EXAM: CHEST - 2 VIEW COMPARISON:  09/16/2022 FINDINGS: Frontal and lateral views of the chest demonstrate an unremarkable cardiac silhouette. No acute airspace disease, effusion, or pneumothorax. No acute bony abnormalities. IMPRESSION: 1. No acute intrathoracic process. Electronically Signed   By: Sharlet Salina M.D.   On: 03/12/2023 18:13    Procedures Procedures (including critical care time)  Medications Ordered in UC Medications  ondansetron (ZOFRAN-ODT) disintegrating tablet 4 mg (4 mg Oral Given 03/12/23 1805)  albuterol (VENTOLIN HFA) 108 (90 Base) MCG/ACT inhaler 1-2 puff (2 puffs Inhalation Given 03/12/23 1823)    Initial Impression / Assessment and Plan / UC Course  I have reviewed the triage vital signs and the nursing notes.  Pertinent labs & imaging results that were available during my care of the patient were reviewed by me and considered in my medical decision making (see chart for details).  Vitals and triage reviewed, patient is hemodynamically stable.  Tolerating p.o. food and fluids in clinic, nausea improved with Zofran.  Chest x-ray does not reveal any acute cardiopulmonary processes or pneumonia.  COVID and flu testing is negative.  Patient does have minor expiratory wheezing and chest tightness with coughing.  No chest pain.  Symptomatic improvement with albuterol inhaler.  Suspect viral illness.   Symptomatic management reviewed.  Plan of care, follow-up care and return precautions given, no questions at this time.     Final Clinical Impressions(s) / UC Diagnoses   Final diagnoses:  Shortness of breath  Nausea vomiting and diarrhea  Viral syndrome     Discharge Instructions      Your COVID and flu testing were negative.  Ensure you are staying well-hydrated and drinking plenty of fluids to stay hydrated.  Take the [facility] every 8 hours as needed for cough.  Follow a bland diet such as bananas, rice, toast, applesauce.  Soups and broths can help as well.  Seek immediate care at the nearest emergency department for any chest pain, worsening shortness of breath, uncontrollable vomiting, or new concerning symptoms.      ED Prescriptions     Medication Sig Dispense Auth. Provider   benzonatate (TESSALON) 200 MG capsule Take 1 capsule (200 mg total) by mouth every 8 (eight) hours. 30 capsule Santiago Stenzel, [place] N, [place]      PDMP not reviewed this  encounter.   Tegan Britain, [place] N, [place] 03/12/23 [phone]

## 2023-03-12 NOTE — Discharge Instructions (Addendum)
Your COVID and flu testing were negative.  Ensure you are staying well-hydrated and drinking plenty of fluids to stay hydrated.  Take the [facility] every 8 hours as needed for cough.  Follow a bland diet such as bananas, rice, toast, applesauce.  Soups and broths can help as well.  Seek immediate care at the nearest emergency department for any chest pain, worsening shortness of breath, uncontrollable vomiting, or new concerning symptoms.

## 2023-03-12 NOTE — ED Triage Notes (Addendum)
Patient reports that she has had N/v/D today Emesis x 3 and Diarrhea x 3.  Patient states she took Tylenol and something for vomiting, but doesn't know the name of the medication.  patient added that she also felt SOB.

## 2023-03-14 ENCOUNTER — Emergency Department (HOSPITAL_COMMUNITY)
Admission: EM | Admit: 2023-03-14 | Discharge: 2023-03-15 | Disposition: A | Discharge status: Home / Self Care | Payer: Medicare Other | Attending: Emergency Medicine | Admitting: Emergency Medicine

## 2023-03-14 ENCOUNTER — Other Ambulatory Visit: Payer: Self-pay

## 2023-03-14 ENCOUNTER — Encounter (HOSPITAL_COMMUNITY): Payer: Self-pay | Admitting: *Deleted

## 2023-03-14 DIAGNOSIS — Z794 Long term (current) use of insulin: Secondary | ICD-10-CM | POA: Insufficient documentation

## 2023-03-14 DIAGNOSIS — R1084 Generalized abdominal pain: Secondary | ICD-10-CM | POA: Diagnosis present

## 2023-03-14 DIAGNOSIS — Z7982 Long term (current) use of aspirin: Secondary | ICD-10-CM | POA: Diagnosis not present

## 2023-03-14 DIAGNOSIS — I10 Essential (primary) hypertension: Secondary | ICD-10-CM | POA: Insufficient documentation

## 2023-03-14 DIAGNOSIS — Z79899 Other long term (current) drug therapy: Secondary | ICD-10-CM | POA: Insufficient documentation

## 2023-03-14 DIAGNOSIS — K209 Esophagitis, unspecified without bleeding: Secondary | ICD-10-CM | POA: Insufficient documentation

## 2023-03-14 DIAGNOSIS — E119 Type 2 diabetes mellitus without complications: Secondary | ICD-10-CM | POA: Insufficient documentation

## 2023-03-14 DIAGNOSIS — E039 Hypothyroidism, unspecified: Secondary | ICD-10-CM | POA: Insufficient documentation

## 2023-03-14 DIAGNOSIS — R112 Nausea with vomiting, unspecified: Secondary | ICD-10-CM

## 2023-03-14 LAB — CBC
HCT: 36.5 % (ref 36.0–46.0)
Hemoglobin: 12 g/dL (ref 12.0–15.0)
MCH: 28 pg (ref 26.0–34.0)
MCHC: 32.9 g/dL (ref 30.0–36.0)
MCV: 85.1 fL (ref 80.0–100.0)
Platelets: 326 10*3/uL (ref 150–400)
RBC: 4.29 MIL/uL (ref 3.87–5.11)
RDW: 14.8 % (ref 11.5–15.5)
WBC: 6.3 10*3/uL (ref 4.0–10.5)
nRBC: 0 % (ref 0.0–0.2)

## 2023-03-14 LAB — BASIC METABOLIC PANEL
Anion gap: 14 (ref 5–15)
BUN: 18 mg/dL (ref 8–23)
CO2: 18 mmol/L — ABNORMAL LOW (ref 22–32)
Calcium: 8.5 mg/dL — ABNORMAL LOW (ref 8.9–10.3)
Chloride: 99 mmol/L (ref 98–111)
Creatinine, Ser: 1.2 mg/dL — ABNORMAL HIGH (ref 0.44–1.00)
GFR, Estimated: 48 mL/min — ABNORMAL LOW (ref >60)
Glucose, Bld: 461 mg/dL — ABNORMAL HIGH (ref 70–99)
Potassium: 3.9 mmol/L (ref 3.5–5.1)
Sodium: 131 mmol/L — ABNORMAL LOW (ref 135–145)

## 2023-03-14 LAB — TROPONIN I (HIGH SENSITIVITY): Troponin I (High Sensitivity): 7 ng/L (ref <18)

## 2023-03-14 NOTE — ED Notes (Signed)
Provider at bedside for MSE.

## 2023-03-14 NOTE — ED Triage Notes (Addendum)
Pt arrived from home for ongoing epigastric/ chest pain since Wednesday with NVD. Was seen at [facility] and told to follow up if symptoms persisted; had negative flu/covid test and chest xray two days ago

## 2023-03-14 NOTE — ED Provider Triage Note (Signed)
Emergency Medicine Provider Triage Evaluation Note  [name] , a 73 y.o. female  was evaluated in triage.  Pt complains of chest pain nausea shortness of breath.  Recent flulike symptoms.  She has had nausea vomiting and diarrhea.  Symptoms of gotten worse, recent workup at urgent care.  History of CAD, factor V Leiden, diabetes, history of DVT.  Not on any blood thinners  Review of Systems  Positive: Shortness of breath Negative: Respiratory distress  Physical Exam  BP (!) 165/76   Pulse 91   Temp 99.4 F (37.4 C) (Oral)   Resp (!) 25   SpO2 99%  Gen:   Awake, no distress   Resp:  Normal effort  MSK:   Moves extremities without difficulty  Other:    Medical Decision Making  Medically screening exam initiated at 10:30 PM.  Appropriate orders placed.  Reginal Lutes was informed that the remainder of the evaluation will be completed by another provider, this initial triage assessment does not replace that evaluation, and the importance of remaining in the ED until their evaluation is complete.     [place], [place], PA-C 03/14/23 2232

## 2023-03-15 ENCOUNTER — Emergency Department (HOSPITAL_COMMUNITY): Payer: Medicare Other

## 2023-03-15 MED ORDER — LACTATED RINGERS IV BOLUS
30.0000 mL/kg | Freq: Once | INTRAVENOUS | Status: AC
  Administered 2023-03-15: 2790 mL via INTRAVENOUS

## 2023-03-15 MED ORDER — ALUM & MAG HYDROXIDE-SIMETH 200-200-20 MG/5ML PO SUSP
30.0000 mL | Freq: Once | ORAL | Status: AC
  Administered 2023-03-15: 30 mL via ORAL
  Filled 2023-03-15: qty 30

## 2023-03-15 MED ORDER — IOHEXOL 350 MG/ML SOLN
100.0000 mL | Freq: Once | INTRAVENOUS | Status: AC | PRN
  Administered 2023-03-15: 100 mL via INTRAVENOUS

## 2023-03-15 MED ORDER — LIDOCAINE VISCOUS HCL 2 % MT SOLN
15.0000 mL | Freq: Once | OROMUCOSAL | Status: AC
  Administered 2023-03-15: 15 mL via OROMUCOSAL
  Filled 2023-03-15: qty 15

## 2023-03-15 MED ORDER — LIDOCAINE VISCOUS HCL 2 % MT SOLN
15.0000 mL | Freq: Once | OROMUCOSAL | Status: DC
Start: 2023-03-15 — End: 2023-03-15
  Filled 2023-03-15: qty 15

## 2023-03-15 MED ORDER — SUCRALFATE 1 GM/10ML PO SUSP: 1.0000 g | Freq: Three times a day (TID) | ORAL | 0 refills | Status: DC

## 2023-03-15 MED ORDER — ONDANSETRON 8 MG PO TBDP: ORAL_TABLET | ORAL | 0 refills | Status: DC

## 2023-03-15 MED ORDER — ONDANSETRON HCL 4 MG/2ML IJ SOLN
4.0000 mg | Freq: Once | INTRAMUSCULAR | Status: AC
  Administered 2023-03-15: 4 mg via INTRAVENOUS
  Filled 2023-03-15: qty 2

## 2023-03-15 NOTE — ED Provider Notes (Signed)
[facility] AT [facility] Provider Note   CSN: [id number] Arrival date & time: 03/14/23  2159     History  Chief Complaint  Patient presents with   Abdominal Pain   Chest Pain    [name] is a 73 y.o. female.  The history is provided by the patient.  Abdominal Pain Pain location:  Generalized Pain radiates to:  Does not radiate Pain severity:  Moderate Onset quality:  Gradual Timing:  Constant Progression:  Unchanged Chronicity:  New Context: not trauma   Relieved by:  Nothing Worsened by:  Nothing Ineffective treatments:  None tried Risk factors: no recent hospitalization   Patient with Diabetes who was diagnosed with viral nausea vomiting and diarrhea presents with dull SSCP and abdominal pain and ongoing nausea.      Past Medical History:  Diagnosis Date   Clotting disorder (HCC)    Constipation    history of   Diabetes mellitus without complication (HCC)    DVT (deep venous thrombosis) (HCC)    2007   Factor V Leiden (HCC)    deficiency    GERD (gastroesophageal reflux disease)    Hypertension    Hypothyroidism    Kidney disease    early stage   NSTEMI (non-ST elevated myocardial infarction) (HCC)    2011 - possibly r/t DVT/embolization?    Sleep apnea    no c-pap     Home Medications Prior to Admission medications   Medication Sig Start Date End Date Taking? Authorizing Provider  sucralfate (CARAFATE) 1 GM/10ML suspension Take 10 mLs (1 g total) by mouth 4 (four) times daily -  with meals and at bedtime. 03/15/23  Yes Brailon Don, MD  acetaminophen (TYLENOL) 500 MG tablet Take 500-1,000 mg by mouth every 6 (six) hours as needed for mild pain or headache.    [provider]  albuterol (VENTOLIN HFA) 108 (90 Base) MCG/ACT inhaler Inhale 2 puffs into the lungs every 6 (six) hours as needed for wheezing or shortness of breath. 02/08/20   Darlin Drop, DO  amLODipine (NORVASC) 5 MG tablet Take 1 tablet  (5 mg total) by mouth daily. 08/08/21   Rolly Salter, MD  Ascorbic Acid (VITAMIN C) 1000 MG tablet Take 1,000 mg by mouth daily.    [provider]  ASPIRIN 81 PO Take 81 mg by mouth daily.    [provider]  benzonatate (TESSALON) 200 MG capsule Take 1 capsule (200 mg total) by mouth every 8 (eight) hours. 03/12/23   Garrison, [place] N, FNP  Blood Glucose Monitoring Suppl (ONETOUCH VERIO) w/Device KIT See admin instructions. 02/14/20   [provider]  cyclobenzaprine (FLEXERIL) 10 MG tablet Take 1 tablet (10 mg total) by mouth 3 (three) times daily as needed for muscle spasms. 01/01/23   Geoffery Lyons, MD  ezetimibe-simvastatin (VYTORIN) 10-40 MG tablet Take 1 tablet by mouth daily. PATIENT MUST SCHEDULE APPOINTMENT FOR FUTURE REFILLS. FIRST ATTEMPT 01/15/21   Chrystie Nose, MD  ferrous sulfate 325 (65 FE) MG tablet Take 1 tablet (325 mg total) by mouth daily with breakfast. 08/08/21   Rolly Salter, MD  fluticasone (FLONASE) 50 MCG/ACT nasal spray Place 1 spray into both nostrils daily. Patient taking differently: Place 1 spray into both nostrils daily as needed for allergies. 03/29/22   Gustavus Bryant, FNP  insulin glargine (LANTUS SOLOSTAR) 100 UNIT/ML Solostar Pen Inject 20 Units into the skin in the morning. Patient taking differently: Inject 45  Units into the skin at bedtime. 08/07/21   Rolly Salter, MD  insulin lispro (HUMALOG KWIKPEN) 100 UNIT/ML KwikPen Inject 0-25 Units into the skin See admin instructions. Inject 0-25 units into the skin three to four times a day, PER SLIDING SCALE    [provider]  levothyroxine (SYNTHROID) 25 MCG tablet Take 1 tablet (25 mcg total) by mouth daily before breakfast. 09/16/22   Rancour, Jeannett Senior, MD  levothyroxine (SYNTHROID, LEVOTHROID) 200 MCG tablet Take 200 mcg by mouth daily before breakfast.     [provider]  losartan (COZAAR) 100 MG tablet Take 1 tablet (100 mg total) by mouth daily. 02/09/20  08/04/21  Darlin Drop, DO  losartan (COZAAR) 100 MG tablet Take 100 mg by mouth daily.    [provider]  metoprolol succinate (TOPROL-XL) 25 MG 24 hr tablet Take 25 mg by mouth daily.    [provider]  Mouthwash Compounding Base LIQD Swish and spit 15 mLs every 6 (six) hours as needed. Maalox 80 mL; nystatin 100,000 units/ML  80 mL; viscous lidocaine 2%- 80ml 06/23/20   Lamptey, Britta Mccreedy, MD  nystatin (MYCOSTATIN) 100000 UNIT/ML suspension Take 5 mLs (500,000 Units total) by mouth 4 (four) times daily. 08/06/20   Cheryll Cockayne, MD  [facility] ULTRA test strip  01/25/20   [provider]  pantoprazole (PROTONIX) 40 MG tablet Take 1 tablet (40 mg total) by mouth daily at 6 (six) AM. Patient taking differently: Take 40 mg by mouth daily as needed (heartburn). 01/08/18   Regalado, Belkys A, MD  polyethylene glycol (MIRALAX / GLYCOLAX) 17 g packet Take 17 g by mouth daily as needed for mild constipation. 08/07/21   Rolly Salter, MD  zinc gluconate 50 MG tablet Take 50 mg by mouth daily.    [provider]      Allergies    Patient has no known allergies.    Review of Systems   Review of Systems  Constitutional:  Negative for fever.  Gastrointestinal:  Positive for diarrhea, nausea and vomiting.  All other systems reviewed and are negative.   Physical Exam Updated Vital Signs BP (!) 155/139 (BP Location: Right Arm)   Pulse 79   Temp 97.8 F (36.6 C) (Oral)   Resp 17   Wt 93 kg   SpO2 100%   BMI 34.12 kg/m  Physical Exam Vitals and nursing note reviewed.  Constitutional:      General: She is not in acute distress.    Appearance: Normal appearance. She is well-developed.  HENT:     Head: Normocephalic and atraumatic.     Nose: Nose normal.  Eyes:     Pupils: Pupils are equal, round, and reactive to light.  Cardiovascular:     Rate and Rhythm: Normal rate and regular rhythm.     Pulses: Normal pulses.     Heart sounds: Normal heart sounds.   Pulmonary:     Effort: Pulmonary effort is normal. No respiratory distress.     Breath sounds: Normal breath sounds.  Abdominal:     General: Bowel sounds are normal. There is no distension.     Palpations: Abdomen is soft.     Tenderness: There is no abdominal tenderness. There is no guarding or rebound.  Musculoskeletal:        General: Normal range of motion.     Cervical back: Neck supple.  Skin:    General: Skin is warm and dry.     Capillary  Refill: Capillary refill takes less than 2 seconds.     Findings: No erythema or rash.  Neurological:     General: No focal deficit present.     Mental Status: She is alert.     Deep Tendon Reflexes: Reflexes normal.  Psychiatric:        Mood and Affect: Mood normal.     ED Results / Procedures / Treatments   Labs (all labs ordered are listed, but only abnormal results are displayed) Results for orders placed or performed during the hospital encounter of 03/14/23  Basic metabolic panel   Collection Time: 03/14/23 10:14 PM  Result Value Ref Range   Sodium 131 (L) 135 - 145 mmol/L   Potassium 3.9 3.5 - 5.1 mmol/L   Chloride 99 98 - 111 mmol/L   CO2 18 (L) 22 - 32 mmol/L   Glucose, Bld 461 (H) 70 - 99 mg/dL   BUN 18 8 - 23 mg/dL   Creatinine, Ser 8.11 (H) 0.44 - 1.00 mg/dL   Calcium 8.5 (L) 8.9 - 10.3 mg/dL   GFR, Estimated 48 (L) >60 mL/min   Anion gap 14 5 - 15  CBC   Collection Time: 03/14/23 10:14 PM  Result Value Ref Range   WBC 6.3 4.0 - 10.5 K/uL   RBC 4.29 3.87 - 5.11 MIL/uL   Hemoglobin 12.0 12.0 - 15.0 g/dL   HCT 91.4 78.2 - 95.6 %   MCV 85.1 80.0 - 100.0 fL   MCH 28.0 26.0 - 34.0 pg   MCHC 32.9 30.0 - 36.0 g/dL   RDW 21.3 08.6 - 57.8 %   Platelets 326 150 - 400 K/uL   nRBC 0.0 0.0 - 0.2 %  Troponin I (High Sensitivity)   Collection Time: 03/14/23 10:14 PM  Result Value Ref Range   Troponin I (High Sensitivity) 7 <18 ng/L   CT Angio Chest/Abd/Pel for Dissection W and/or Wo Contrast Result Date:  03/15/2023 CLINICAL DATA:  Epigastric and chest pain suspected vascular etiology such as septic arterial embolism, aortic aneurysm or dissection. EXAM: CT ANGIOGRAPHY CHEST, ABDOMEN AND PELVIS TECHNIQUE: Chest CT without contrast was initially performed. Multidetector CT imaging through the chest, abdomen and pelvis was performed using the standard protocol during bolus administration of intravenous contrast. Multiplanar reconstructed images and MIPs were obtained and reviewed to evaluate the vascular anatomy. RADIATION DOSE REDUCTION: This exam was performed according to the departmental dose-optimization program which includes automated exposure control, adjustment of the mA and/or kV according to patient size and/or use of iterative reconstruction technique. CONTRAST:  OMNIPAQUE IOHEXOL 350 MG/ML SOLN COMPARISON:  PA and lateral chest 03/12/2023, PA and lateral chest 09/16/2022, and chest, abdomen and pelvis CT with IV contrast 08/04/2021. FINDINGS: CTA CHEST FINDINGS Cardiovascular: The cardiac size is normal. No significant pericardial fluid is seen. There are no appreciable coronary artery calcifications and only minimal aortic calcific plaque in the distal arch. The aorta is normal in course, caliber and enhancement. No dissection, aneurysm or stenosis is seen. The great vessels are tortuous but clear, with two-vessel aortic arch and normal-variant brachiobicarotid trunk. The pulmonary arteries and veins are normal in caliber. Pulmonary arteries are also fairly well opacified and do not show evidence of embolic disease. Mediastinum/Nodes: There is a circumferentially moderately thickened appearance of the distal thoracic esophagus below the level of the carina. This could be due to esophagitis or infiltrating disease. Direct visualization is recommended. This has increased since the prior study. No thyroid or axillary mass is  seen. There is no intrathoracic adenopathy. The thoracic trachea, both main  bronchi are unremarkable. Lungs/Pleura: There is chronic right lower lobe paraspinal scarring and bronchiolectasis. There is a chronic 9 mm pleural-based left lower lobe nodule laterally on 16:62, also stable since CTA chest 02/03/2020 consistent with a benign process. There are mild posterior atelectatic changes in the lower lobes, small area of chronic subpleural ground-glass scarring in the lateral left upper lobe on 16:47-50. Additional chronic and likely postinflammatory/post pneumonic subpleural reticulation is present in the lateral base of the right upper lobe and in the lateral lingula. There is no acute lung infiltrate, no pleural effusion or thickening. Musculoskeletal: Degenerative changes and mild kyphosis thoracic spine. No acute or other significant osseous findings. Mild elevation right hemidiaphragm. No chest wall mass is seen. Review of the MIP images confirms the above findings. CTA ABDOMEN AND PELVIS FINDINGS VASCULAR Aorta: Normal caliber aorta without aneurysm, dissection, vasculitis or significant stenosis. Celiac: Patent without evidence of aneurysm, dissection, vasculitis or significant stenosis. SMA: Patent without evidence of aneurysm, dissection, vasculitis or significant stenosis. Renals: Both are single and both are widely patent although the right renal artery is asymmetrically smaller, with multifocal scarring and patchy atrophy of the right renal cortex again shown. IMA: Patent without evidence of aneurysm, dissection, vasculitis or significant stenosis. Inflow: There are scattered nonstenosing calcific plaques in the common iliac and internal iliac arteries. No appreciable plaques in the external iliac arteries. There is no flow-limiting inflow vessel stenosis either side. Visualized proximal outflow arteries to the lower extremities are clear. Veins: No obvious venous abnormality within the limitations of this arterial phase study. Review of the MIP images confirms the above  findings. NON-VASCULAR Hepatobiliary: No focal liver abnormality is seen. No gallstones, gallbladder wall thickening, or biliary dilatation. Pancreas: No abnormality. Spleen: No abnormality.  No splenomegaly. Adrenals/Urinary Tract: There is no adrenal mass. As above there is extensive cortical scarring with asymmetric moderate atrophy of the right kidney. No mass enhancement in either kidney. No urinary stone or obstruction bilaterally. The bladder is contracted and not well seen but there are no inflammatory changes around it. Stomach/Bowel: No dilatation or wall thickening including the appendix. Scattered descending and sigmoid diverticulosis noted without diverticulitis. Lymphatic: No lymphadenopathy is seen. Reproductive: Mildly enlarged lobular fibroid uterus, unchanged. No adnexal masses seen. Other: Small umbilical fat hernia. No incarcerated hernia. No free fluid, free hemorrhage or free air. Musculoskeletal: Mild hip DJD. Degenerative change lumbar spine including with advanced facet hypertrophy from L3-4 down. No acute or other significant osseous findings. Review of the MIP images confirms the above findings. IMPRESSION: 1. No evidence of aortic aneurysm or dissection. Only minimal atherosclerosis. 2. No evidence of pulmonary embolism or arterial dilatation. 3. No abdominal aortoiliac or visceral arterial stenosis. 4. Moderate circumferential thickening of the distal thoracic esophagus below the level of the carina. This could be due to esophagitis or infiltrating disease. Direct visualization is recommended. 5. Chronic 9 mm pleural-based left lower lobe nodule consistent with a benign process. 6. Chronic lung scarring changes. 7. Chronic right renal cortical scarring and asymmetric atrophy. 8. Colonic diverticulosis without evidence of diverticulitis. 9. Fibroid uterus. 10. Umbilical fat hernia. 11. Degenerative changes in the spine and hips. Electronically Signed   By: Almira Bar M.D.   On:  03/15/2023 00:55   DG Chest 2 View Result Date: 03/12/2023 CLINICAL DATA:  Short of breath, emesis, diarrhea EXAM: CHEST - 2 VIEW COMPARISON:  09/16/2022 FINDINGS: Frontal and lateral views of the  chest demonstrate an unremarkable cardiac silhouette. No acute airspace disease, effusion, or pneumothorax. No acute bony abnormalities. IMPRESSION: 1. No acute intrathoracic process. Electronically Signed   By: Sharlet Salina M.D.   On: 03/12/2023 18:13  ]  EKG EKG Interpretation Date/Time:  Friday March 14 2023 22:09:46 EST Ventricular Rate:  93 PR Interval:  154 QRS Duration:  97 QT Interval:  382 QTC Calculation: 476 R Axis:   37  Text Interpretation: Sinus rhythm Confirmed by Nicanor Alcon, Luna Audia ([id number]) on 03/14/2023 11:11:50 PM  Radiology CT Angio Chest/Abd/Pel for Dissection W and/or Wo Contrast Result Date: 03/15/2023 CLINICAL DATA:  Epigastric and chest pain suspected vascular etiology such as septic arterial embolism, aortic aneurysm or dissection. EXAM: CT ANGIOGRAPHY CHEST, ABDOMEN AND PELVIS TECHNIQUE: Chest CT without contrast was initially performed. Multidetector CT imaging through the chest, abdomen and pelvis was performed using the standard protocol during bolus administration of intravenous contrast. Multiplanar reconstructed images and MIPs were obtained and reviewed to evaluate the vascular anatomy. RADIATION DOSE REDUCTION: This exam was performed according to the departmental dose-optimization program which includes automated exposure control, adjustment of the mA and/or kV according to patient size and/or use of iterative reconstruction technique. CONTRAST:  OMNIPAQUE IOHEXOL 350 MG/ML SOLN COMPARISON:  PA and lateral chest 03/12/2023, PA and lateral chest 09/16/2022, and chest, abdomen and pelvis CT with IV contrast 08/04/2021. FINDINGS: CTA CHEST FINDINGS Cardiovascular: The cardiac size is normal. No significant pericardial fluid is seen. There are no appreciable coronary  artery calcifications and only minimal aortic calcific plaque in the distal arch. The aorta is normal in course, caliber and enhancement. No dissection, aneurysm or stenosis is seen. The great vessels are tortuous but clear, with two-vessel aortic arch and normal-variant brachiobicarotid trunk. The pulmonary arteries and veins are normal in caliber. Pulmonary arteries are also fairly well opacified and do not show evidence of embolic disease. Mediastinum/Nodes: There is a circumferentially moderately thickened appearance of the distal thoracic esophagus below the level of the carina. This could be due to esophagitis or infiltrating disease. Direct visualization is recommended. This has increased since the prior study. No thyroid or axillary mass is seen. There is no intrathoracic adenopathy. The thoracic trachea, both main bronchi are unremarkable. Lungs/Pleura: There is chronic right lower lobe paraspinal scarring and bronchiolectasis. There is a chronic 9 mm pleural-based left lower lobe nodule laterally on 16:62, also stable since CTA chest 02/03/2020 consistent with a benign process. There are mild posterior atelectatic changes in the lower lobes, small area of chronic subpleural ground-glass scarring in the lateral left upper lobe on 16:47-50. Additional chronic and likely postinflammatory/post pneumonic subpleural reticulation is present in the lateral base of the right upper lobe and in the lateral lingula. There is no acute lung infiltrate, no pleural effusion or thickening. Musculoskeletal: Degenerative changes and mild kyphosis thoracic spine. No acute or other significant osseous findings. Mild elevation right hemidiaphragm. No chest wall mass is seen. Review of the MIP images confirms the above findings. CTA ABDOMEN AND PELVIS FINDINGS VASCULAR Aorta: Normal caliber aorta without aneurysm, dissection, vasculitis or significant stenosis. Celiac: Patent without evidence of aneurysm, dissection, vasculitis  or significant stenosis. SMA: Patent without evidence of aneurysm, dissection, vasculitis or significant stenosis. Renals: Both are single and both are widely patent although the right renal artery is asymmetrically smaller, with multifocal scarring and patchy atrophy of the right renal cortex again shown. IMA: Patent without evidence of aneurysm, dissection, vasculitis or significant stenosis. Inflow: There are scattered nonstenosing  calcific plaques in the common iliac and internal iliac arteries. No appreciable plaques in the external iliac arteries. There is no flow-limiting inflow vessel stenosis either side. Visualized proximal outflow arteries to the lower extremities are clear. Veins: No obvious venous abnormality within the limitations of this arterial phase study. Review of the MIP images confirms the above findings. NON-VASCULAR Hepatobiliary: No focal liver abnormality is seen. No gallstones, gallbladder wall thickening, or biliary dilatation. Pancreas: No abnormality. Spleen: No abnormality.  No splenomegaly. Adrenals/Urinary Tract: There is no adrenal mass. As above there is extensive cortical scarring with asymmetric moderate atrophy of the right kidney. No mass enhancement in either kidney. No urinary stone or obstruction bilaterally. The bladder is contracted and not well seen but there are no inflammatory changes around it. Stomach/Bowel: No dilatation or wall thickening including the appendix. Scattered descending and sigmoid diverticulosis noted without diverticulitis. Lymphatic: No lymphadenopathy is seen. Reproductive: Mildly enlarged lobular fibroid uterus, unchanged. No adnexal masses seen. Other: Small umbilical fat hernia. No incarcerated hernia. No free fluid, free hemorrhage or free air. Musculoskeletal: Mild hip DJD. Degenerative change lumbar spine including with advanced facet hypertrophy from L3-4 down. No acute or other significant osseous findings. Review of the MIP images confirms  the above findings. IMPRESSION: 1. No evidence of aortic aneurysm or dissection. Only minimal atherosclerosis. 2. No evidence of pulmonary embolism or arterial dilatation. 3. No abdominal aortoiliac or visceral arterial stenosis. 4. Moderate circumferential thickening of the distal thoracic esophagus below the level of the carina. This could be due to esophagitis or infiltrating disease. Direct visualization is recommended. 5. Chronic 9 mm pleural-based left lower lobe nodule consistent with a benign process. 6. Chronic lung scarring changes. 7. Chronic right renal cortical scarring and asymmetric atrophy. 8. Colonic diverticulosis without evidence of diverticulitis. 9. Fibroid uterus. 10. Umbilical fat hernia. 11. Degenerative changes in the spine and hips. Electronically Signed   By: Almira Bar M.D.   On: 03/15/2023 00:55    Procedures Procedures    Medications Ordered in ED Medications  ondansetron (ZOFRAN) injection 4 mg (4 mg Intravenous Patient Refused/Not Given 03/15/23 0227)  iohexol (OMNIPAQUE) 350 MG/ML injection 100 mL (100 mLs Intravenous Contrast Given 03/15/23 0022)  lactated ringers bolus 2,790 mL (2,790 mLs Intravenous New Bag/Given 03/15/23 0034)    ED Course/ Medical Decision Making/ A&P                                 Medical Decision Making Patient with viral nausea vomiting and diarrhea and now pain in chest with emesis for several days   Amount and/or Complexity of Data Reviewed External Data Reviewed: labs and notes.    Details: Previous notes and covid swab reviewed  Labs: ordered.    Details: Normal white count 6.3, normal hemoglobin 12, normal platelets.  Sodium slight low 131, normal platelets 3.9, elevated creatinine 1.2, normal troponin 7  Radiology: ordered. ECG/medicine tests: ordered and independent interpretation performed. Decision-making details documented in ED Course.  Risk OTC drugs. Prescription drug management. Risk Details: With ongoing CP for  several days one troponin and EKG is sufficient to rule out ACS.  No Dissection or PE on CTA.  Patient with thickened esophagus.  I will start medication for esophagitis given pain post emesis but I have both verbally and in writing informed patient of need for close follow up with gastroenterology for biopsy for pathology.  Patient verbalizes understanding and agrees  to follow up. Stable for discharge with close follow up    Final Clinical Impression(s) / ED Diagnoses Final diagnoses:  Esophagitis  Nausea vomiting and diarrhea   Return for intractable cough, coughing up blood, fevers > 100.4 unrelieved by medication, shortness of breath, intractable vomiting, chest pain, shortness of breath, weakness, numbness, changes in speech, facial asymmetry, abdominal pain, passing out, Inability to tolerate liquids or food, cough, altered mental status or any concerns. No signs of systemic illness or infection. The patient is nontoxic-appearing on exam and vital signs are within normal limits.  I have reviewed the triage vital signs and the nursing notes. Pertinent labs & imaging results that were available during my care of the patient were reviewed by me and considered in my medical decision making (see chart for details). After history, exam, and medical workup I feel the patient has been appropriately medically screened and is safe for discharge home. Pertinent diagnoses were discussed with the patient. Patient was given return precautions.  Rx / DC Orders ED Discharge Orders          Ordered    sucralfate (CARAFATE) 1 GM/10ML suspension  3 times daily with meals & bedtime        03/15/23 0207              Ashleah Valtierra, MD 03/15/23 1610

## 2023-05-28 ENCOUNTER — Ambulatory Visit (INDEPENDENT_AMBULATORY_CARE_PROVIDER_SITE_OTHER): Payer: Medicare Other | Admitting: Podiatry

## 2023-05-28 DIAGNOSIS — Z91199 Patient's noncompliance with other medical treatment and regimen due to unspecified reason: Secondary | ICD-10-CM

## 2023-05-31 NOTE — Progress Notes (Signed)
 1. No-show for appointment    No show #1.

## 2023-06-26 ENCOUNTER — Ambulatory Visit: Payer: Medicare Other | Admitting: Internal Medicine

## 2023-08-22 ENCOUNTER — Inpatient Hospital Stay (HOSPITAL_COMMUNITY)
Admission: EM | Admit: 2023-08-22 | Discharge: 2023-08-26 | DRG: 638 | Disposition: A | Discharge status: Home / Self Care | Attending: Family Medicine | Admitting: Family Medicine

## 2023-08-22 ENCOUNTER — Encounter (HOSPITAL_COMMUNITY): Payer: Self-pay

## 2023-08-22 DIAGNOSIS — I129 Hypertensive chronic kidney disease with stage 1 through stage 4 chronic kidney disease, or unspecified chronic kidney disease: Secondary | ICD-10-CM | POA: Diagnosis present

## 2023-08-22 DIAGNOSIS — I252 Old myocardial infarction: Secondary | ICD-10-CM

## 2023-08-22 DIAGNOSIS — Z801 Family history of malignant neoplasm of trachea, bronchus and lung: Secondary | ICD-10-CM

## 2023-08-22 DIAGNOSIS — E119 Type 2 diabetes mellitus without complications: Secondary | ICD-10-CM

## 2023-08-22 DIAGNOSIS — E1110 Type 2 diabetes mellitus with ketoacidosis without coma: Secondary | ICD-10-CM | POA: Diagnosis present

## 2023-08-22 DIAGNOSIS — N1831 Chronic kidney disease, stage 3a: Secondary | ICD-10-CM | POA: Diagnosis present

## 2023-08-22 DIAGNOSIS — K219 Gastro-esophageal reflux disease without esophagitis: Secondary | ICD-10-CM | POA: Diagnosis present

## 2023-08-22 DIAGNOSIS — E875 Hyperkalemia: Secondary | ICD-10-CM | POA: Diagnosis present

## 2023-08-22 DIAGNOSIS — E1151 Type 2 diabetes mellitus with diabetic peripheral angiopathy without gangrene: Secondary | ICD-10-CM | POA: Insufficient documentation

## 2023-08-22 DIAGNOSIS — Z86718 Personal history of other venous thrombosis and embolism: Secondary | ICD-10-CM | POA: Diagnosis not present

## 2023-08-22 DIAGNOSIS — Z833 Family history of diabetes mellitus: Secondary | ICD-10-CM

## 2023-08-22 DIAGNOSIS — G4733 Obstructive sleep apnea (adult) (pediatric): Secondary | ICD-10-CM | POA: Diagnosis present

## 2023-08-22 DIAGNOSIS — E785 Hyperlipidemia, unspecified: Secondary | ICD-10-CM | POA: Diagnosis present

## 2023-08-22 DIAGNOSIS — Z7984 Long term (current) use of oral hypoglycemic drugs: Secondary | ICD-10-CM | POA: Diagnosis not present

## 2023-08-22 DIAGNOSIS — E1122 Type 2 diabetes mellitus with diabetic chronic kidney disease: Secondary | ICD-10-CM | POA: Diagnosis present

## 2023-08-22 DIAGNOSIS — Z8249 Family history of ischemic heart disease and other diseases of the circulatory system: Secondary | ICD-10-CM

## 2023-08-22 DIAGNOSIS — E871 Hypo-osmolality and hyponatremia: Secondary | ICD-10-CM | POA: Diagnosis present

## 2023-08-22 DIAGNOSIS — D72829 Elevated white blood cell count, unspecified: Secondary | ICD-10-CM | POA: Diagnosis present

## 2023-08-22 DIAGNOSIS — Z7982 Long term (current) use of aspirin: Secondary | ICD-10-CM | POA: Diagnosis not present

## 2023-08-22 DIAGNOSIS — I872 Venous insufficiency (chronic) (peripheral): Secondary | ICD-10-CM | POA: Diagnosis present

## 2023-08-22 DIAGNOSIS — D6851 Activated protein C resistance: Secondary | ICD-10-CM | POA: Diagnosis present

## 2023-08-22 DIAGNOSIS — T383X6A Underdosing of insulin and oral hypoglycemic [antidiabetic] drugs, initial encounter: Secondary | ICD-10-CM | POA: Diagnosis present

## 2023-08-22 DIAGNOSIS — Z79890 Hormone replacement therapy (postmenopausal): Secondary | ICD-10-CM | POA: Diagnosis not present

## 2023-08-22 DIAGNOSIS — N390 Urinary tract infection, site not specified: Secondary | ICD-10-CM | POA: Diagnosis not present

## 2023-08-22 DIAGNOSIS — E039 Hypothyroidism, unspecified: Secondary | ICD-10-CM | POA: Diagnosis present

## 2023-08-22 DIAGNOSIS — I10 Essential (primary) hypertension: Secondary | ICD-10-CM | POA: Diagnosis present

## 2023-08-22 DIAGNOSIS — Z794 Long term (current) use of insulin: Secondary | ICD-10-CM | POA: Diagnosis not present

## 2023-08-22 DIAGNOSIS — Z79899 Other long term (current) drug therapy: Secondary | ICD-10-CM

## 2023-08-22 DIAGNOSIS — N179 Acute kidney failure, unspecified: Secondary | ICD-10-CM | POA: Diagnosis present

## 2023-08-22 DIAGNOSIS — Z91138 Patient's unintentional underdosing of medication regimen for other reason: Secondary | ICD-10-CM

## 2023-08-22 DIAGNOSIS — G4730 Sleep apnea, unspecified: Secondary | ICD-10-CM | POA: Diagnosis present

## 2023-08-22 DIAGNOSIS — E1121 Type 2 diabetes mellitus with diabetic nephropathy: Secondary | ICD-10-CM | POA: Diagnosis present

## 2023-08-22 LAB — BASIC METABOLIC PANEL WITH GFR
Anion gap: 29 — ABNORMAL HIGH (ref 5–15)
BUN: 39 mg/dL — ABNORMAL HIGH (ref 8–23)
CO2: 10 mmol/L — ABNORMAL LOW (ref 22–32)
Calcium: 10.5 mg/dL — ABNORMAL HIGH (ref 8.9–10.3)
Chloride: 92 mmol/L — ABNORMAL LOW (ref 98–111)
Creatinine, Ser: 2.33 mg/dL — ABNORMAL HIGH (ref 0.44–1.00)
GFR, Estimated: 22 mL/min — ABNORMAL LOW (ref >60)
Glucose, Bld: 630 mg/dL (ref 70–99)
Potassium: 5.6 mmol/L — ABNORMAL HIGH (ref 3.5–5.1)
Sodium: 131 mmol/L — ABNORMAL LOW (ref 135–145)

## 2023-08-22 LAB — BETA-HYDROXYBUTYRIC ACID: Beta-Hydroxybutyric Acid: >8 mmol/L — ABNORMAL HIGH (ref 0.05–0.27)

## 2023-08-22 LAB — CBC WITH DIFFERENTIAL/PLATELET
Abs Immature Granulocytes: 0.08 K/uL — ABNORMAL HIGH (ref 0.00–0.07)
Basophils Absolute: 0 K/uL (ref 0.0–0.1)
Basophils Relative: 0 %
Eosinophils Absolute: 0 K/uL (ref 0.0–0.5)
Eosinophils Relative: 0 %
HCT: 40.5 % (ref 36.0–46.0)
Hemoglobin: 13.3 g/dL (ref 12.0–15.0)
Immature Granulocytes: 1 %
Lymphocytes Relative: 10 %
Lymphs Abs: 1.3 K/uL (ref 0.7–4.0)
MCH: 28.2 pg (ref 26.0–34.0)
MCHC: 32.8 g/dL (ref 30.0–36.0)
MCV: 85.8 fL (ref 80.0–100.0)
Monocytes Absolute: 0.8 K/uL (ref 0.1–1.0)
Monocytes Relative: 6 %
Neutro Abs: 10 K/uL — ABNORMAL HIGH (ref 1.7–7.7)
Neutrophils Relative %: 83 %
Platelets: 363 K/uL (ref 150–400)
RBC: 4.72 MIL/uL (ref 3.87–5.11)
RDW: 14.6 % (ref 11.5–15.5)
WBC: 12.2 K/uL — ABNORMAL HIGH (ref 4.0–10.5)
nRBC: 0 % (ref 0.0–0.2)

## 2023-08-22 LAB — GLUCOSE, CAPILLARY
Glucose-Capillary: 299 mg/dL — ABNORMAL HIGH (ref 70–99)
Glucose-Capillary: 165 mg/dL — ABNORMAL HIGH (ref 70–99)

## 2023-08-22 LAB — BLOOD GAS, VENOUS
Acid-base deficit: 16.2 mmol/L — ABNORMAL HIGH (ref 0.0–2.0)
Bicarbonate: 10 mmol/L — ABNORMAL LOW (ref 20.0–28.0)
O2 Saturation: 81.5 %
Patient temperature: 37
pCO2, Ven: 25 mmHg — ABNORMAL LOW (ref 44–60)
pH, Ven: 7.21 — ABNORMAL LOW (ref 7.25–7.43)
pO2, Ven: 49 mmHg — ABNORMAL HIGH (ref 32–45)

## 2023-08-22 LAB — MRSA NEXT GEN BY PCR, NASAL: MRSA by PCR Next Gen: NOT DETECTED

## 2023-08-22 LAB — CBG MONITORING, ED: Glucose-Capillary: >600 mg/dL (ref 70–99)

## 2023-08-22 LAB — LIPASE, BLOOD: Lipase: 27 U/L (ref 11–51)

## 2023-08-22 MED ORDER — INSULIN REGULAR(HUMAN) IN NACL 100-0.9 UT/100ML-% IV SOLN
INTRAVENOUS | Status: DC
Start: 2023-08-22 — End: 2023-08-26
  Administered 2023-08-22: 11.5 [IU]/h via INTRAVENOUS
  Filled 2023-08-22: qty 100

## 2023-08-22 MED ORDER — ONDANSETRON 4 MG PO TBDP: 4.0000 mg | ORAL_TABLET | Freq: Three times a day (TID) | ORAL | Status: DC | PRN

## 2023-08-22 MED ORDER — DEXTROSE 50 % IV SOLN
0.0000 mL | INTRAVENOUS | Status: DC | PRN
  Administered 2023-08-23: 50 mL via INTRAVENOUS
  Filled 2023-08-22: qty 50

## 2023-08-22 MED ORDER — EZETIMIBE-SIMVASTATIN 10-40 MG PO TABS: 1.0000 | ORAL_TABLET | Freq: Every day | ORAL | Status: DC

## 2023-08-22 MED ORDER — SIMVASTATIN 40 MG PO TABS
40.0000 mg | ORAL_TABLET | Freq: Every day | ORAL | Status: DC
  Administered 2023-08-23 – 2023-08-26 (×4): 40 mg via ORAL
  Filled 2023-08-22 (×4): qty 1

## 2023-08-22 MED ORDER — EZETIMIBE 10 MG PO TABS
10.0000 mg | ORAL_TABLET | Freq: Every day | ORAL | Status: DC
  Administered 2023-08-23 – 2023-08-26 (×4): 10 mg via ORAL
  Filled 2023-08-22 (×4): qty 1

## 2023-08-22 MED ORDER — METOPROLOL SUCCINATE ER 50 MG PO TB24
25.0000 mg | ORAL_TABLET | Freq: Every day | ORAL | Status: DC
  Administered 2023-08-22 – 2023-08-26 (×5): 25 mg via ORAL
  Filled 2023-08-22 (×5): qty 1

## 2023-08-22 MED ORDER — LACTATED RINGERS IV BOLUS
20.0000 mL/kg | Freq: Once | INTRAVENOUS | Status: AC
  Administered 2023-08-22: 1950 mL via INTRAVENOUS

## 2023-08-22 MED ORDER — HEPARIN SODIUM (PORCINE) 5000 UNIT/ML IJ SOLN
5000.0000 [IU] | Freq: Three times a day (TID) | INTRAMUSCULAR | Status: DC
  Administered 2023-08-22 – 2023-08-26 (×11): 5000 [IU] via SUBCUTANEOUS
  Filled 2023-08-22 (×11): qty 1

## 2023-08-22 MED ORDER — LACTATED RINGERS IV BOLUS: 20.0000 mL/kg | Freq: Once | INTRAVENOUS | Status: DC

## 2023-08-22 MED ORDER — ACETAMINOPHEN 500 MG PO TABS
500.0000 mg | ORAL_TABLET | Freq: Four times a day (QID) | ORAL | Status: DC | PRN
  Administered 2023-08-24 (×2): 1000 mg via ORAL
  Administered 2023-08-25: 650 mg via ORAL
  Filled 2023-08-22 (×2): qty 2

## 2023-08-22 MED ORDER — DEXTROSE IN LACTATED RINGERS 5 % IV SOLN: INTRAVENOUS | Status: AC

## 2023-08-22 MED ORDER — ONDANSETRON HCL 4 MG/2ML IJ SOLN
4.0000 mg | Freq: Once | INTRAMUSCULAR | Status: AC
  Administered 2023-08-22: 4 mg via INTRAVENOUS
  Filled 2023-08-22: qty 2

## 2023-08-22 MED ORDER — LEVOTHYROXINE SODIUM 100 MCG PO TABS
200.0000 ug | ORAL_TABLET | Freq: Every day | ORAL | Status: DC
  Administered 2023-08-23 – 2023-08-26 (×4): 200 ug via ORAL
  Filled 2023-08-22 (×5): qty 2

## 2023-08-22 MED ORDER — ASPIRIN 81 MG PO CHEW
81.0000 mg | CHEWABLE_TABLET | Freq: Every day | ORAL | Status: DC
  Administered 2023-08-23 – 2023-08-26 (×4): 81 mg via ORAL
  Filled 2023-08-22 (×4): qty 1

## 2023-08-22 MED ORDER — TRAZODONE HCL 50 MG PO TABS
25.0000 mg | ORAL_TABLET | Freq: Every day | ORAL | Status: DC
  Administered 2023-08-23 – 2023-08-25 (×3): 25 mg via ORAL
  Filled 2023-08-22 (×3): qty 1

## 2023-08-22 MED ORDER — METOPROLOL TARTRATE 5 MG/5ML IV SOLN
5.0000 mg | Freq: Four times a day (QID) | INTRAVENOUS | Status: DC | PRN
  Administered 2023-08-23 – 2023-08-25 (×2): 5 mg via INTRAVENOUS
  Filled 2023-08-22 (×2): qty 5

## 2023-08-22 MED ORDER — ACETAMINOPHEN 650 MG RE SUPP: 650.0000 mg | Freq: Four times a day (QID) | RECTAL | Status: DC | PRN

## 2023-08-22 MED ORDER — CHLORHEXIDINE GLUCONATE CLOTH 2 % EX PADS
6.0000 | MEDICATED_PAD | Freq: Every evening | CUTANEOUS | Status: DC
  Administered 2023-08-22: 6 via TOPICAL

## 2023-08-22 MED ORDER — FENTANYL CITRATE PF 50 MCG/ML IJ SOSY: 12.5000 ug | PREFILLED_SYRINGE | INTRAMUSCULAR | Status: DC | PRN

## 2023-08-22 MED ORDER — AMLODIPINE BESYLATE 5 MG PO TABS
5.0000 mg | ORAL_TABLET | Freq: Every day | ORAL | Status: DC
  Administered 2023-08-22 – 2023-08-26 (×5): 5 mg via ORAL
  Filled 2023-08-22 (×5): qty 1

## 2023-08-22 MED ORDER — PANTOPRAZOLE SODIUM 40 MG PO TBEC
40.0000 mg | DELAYED_RELEASE_TABLET | Freq: Every day | ORAL | Status: DC
  Administered 2023-08-23 – 2023-08-26 (×4): 40 mg via ORAL
  Filled 2023-08-22 (×4): qty 1

## 2023-08-22 MED ORDER — ALBUTEROL SULFATE (2.5 MG/3ML) 0.083% IN NEBU: 3.0000 mL | INHALATION_SOLUTION | Freq: Four times a day (QID) | RESPIRATORY_TRACT | Status: DC | PRN

## 2023-08-22 MED ORDER — LACTATED RINGERS IV SOLN: INTRAVENOUS | Status: AC

## 2023-08-22 MED ORDER — ACETAMINOPHEN 325 MG PO TABS
650.0000 mg | ORAL_TABLET | Freq: Four times a day (QID) | ORAL | Status: DC | PRN
  Administered 2023-08-25 (×2): 650 mg via ORAL
  Filled 2023-08-22 (×3): qty 2

## 2023-08-22 MED ORDER — POLYETHYLENE GLYCOL 3350 17 G PO PACK
17.0000 g | PACK | Freq: Every day | ORAL | Status: DC | PRN
  Administered 2023-08-24: 17 g via ORAL
  Filled 2023-08-22: qty 1

## 2023-08-22 NOTE — Progress Notes (Signed)
 eLink Physician-Brief Progress Note Patient Name: [name] DOB: 1951/01/04 MRN: [id number]   Date of Service  08/22/2023  HPI/Events of Note  73 year old female with a history of type 2 diabetes mellitus, hypothyroidism, gastroesophageal reflux disease and factor V Leiden deficiency anemia presents to the emergency department with weakness found to have diabetic ketoacidosis.  Vital signs are within normal limits.  Saturating 96% on room air.  Laboratory studies consistent with acute anion gap metabolic acidosis, leukocytosis.  eICU Interventions  DKA protocol with aggressive fluid resuscitation, insulin  drip, and electrolyte repletion.  Continue prophylaxis with heparin  GI prophylaxis with pantoprazole      Intervention Category Evaluation Type: New Patient Evaluation  Valencia Kassa 08/22/2023, 10:15 PM

## 2023-08-22 NOTE — Assessment & Plan Note (Signed)
 Likely related to longstanding T2DM

## 2023-08-22 NOTE — Hospital Course (Signed)
 Patient is a 73 year old with history of T2DM, HTN, hypothyroid who apparently has not been taking the best care of herself.  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  Patient noted that she felt weak and unlike herself had increasing nausea and vomiting today.  She reports being out of her Lantus  at home for a while.  Her son who is a type I diabetic gave her some insulin  which she took today prior to presenting to the ED.  In the ED patient was noted to be in profound DKA with a bicarb of 10.  Her initial CBG was 630.  She also was found to have acute on chronic renal insufficiency with creatinine of 2.33. She was also found to have mild hyperkalemia at 5.6 as well as hyponatremia.

## 2023-08-22 NOTE — Assessment & Plan Note (Signed)
 CPAP if needed or evening oxygen  if needed

## 2023-08-22 NOTE — Assessment & Plan Note (Signed)
 Usually on metformin and insulin  Holding this until DKA is corrected.

## 2023-08-22 NOTE — ED Triage Notes (Signed)
 Patient was unable to get her insulin  filled today. Feels weak, vomiting twice. Family gave her their own supply of 16 units of novolog  1 hour ago and 45 units lantus .

## 2023-08-22 NOTE — Assessment & Plan Note (Signed)
 Continue Vytorin.

## 2023-08-22 NOTE — ED Provider Notes (Signed)
 [place] EMERGENCY DEPARTMENT AT [facility] Provider Note   CSN: [id number] Arrival date & time: 08/22/23  1617     Patient presents with: Weakness   [name] is a 73 y.o. female.    Weakness With history of diabetes.  Reportedly unable to get her insulin  filled.  Patient states she had yesterday but sounds like if she has not had her Lantus  for an unknown amount of time.  Unknown last time her sugar was checked also.  Has now had nausea vomiting and feeling weak.  Patient's son gave her 16 units of NovoLog  and 45 units of Lantus  about an hour ago.  Unable to check her sugars at home.    Past Medical History:  Diagnosis Date   Clotting disorder (HCC)    Constipation    history of   Diabetes mellitus without complication (HCC)    DVT (deep venous thrombosis) (HCC)    2007   Factor V Leiden (HCC)    deficiency    GERD (gastroesophageal reflux disease)    Hypertension    Hypothyroidism    Kidney disease    early stage   NSTEMI (non-ST elevated myocardial infarction) (HCC)    2011 - possibly r/t DVT/embolization?    Sleep apnea    no c-pap    Prior to Admission medications   Medication Sig Start Date End Date Taking? Authorizing Provider  Vitamin D , Ergocalciferol , (DRISDOL) 1.25 MG (50000 UNIT) CAPS capsule Take 50,000 Units by mouth every 7 (seven) days. 03/16/20  Yes [provider]  acetaminophen  (TYLENOL ) 500 MG tablet Take 500-1,000 mg by mouth every 6 (six) hours as needed for mild pain or headache.    [provider]  albuterol  (VENTOLIN  HFA) 108 (90 Base) MCG/ACT inhaler Inhale 2 puffs into the lungs every 6 (six) hours as needed for wheezing or shortness of breath. 02/08/20   Shona Terry SAILOR, DO  amLODipine  (NORVASC ) 5 MG tablet Take 1 tablet (5 mg total) by mouth daily. 08/08/21   Tobie Yetta HERO, MD  Ascorbic Acid  (VITAMIN C) 1000 MG tablet Take 1,000 mg by mouth daily.    [provider]  ASPIRIN  81 PO Take 81 mg by  mouth daily.    [provider]  benzonatate  (TESSALON ) 200 MG capsule Take 1 capsule (200 mg total) by mouth every 8 (eight) hours. 03/12/23   Dreama, [place]  N, FNP  Blood Glucose Monitoring Suppl (ONETOUCH VERIO) w/Device KIT See admin instructions. 02/14/20   [provider]  cyclobenzaprine  (FLEXERIL ) 10 MG tablet Take 1 tablet (10 mg total) by mouth 3 (three) times daily as needed for muscle spasms. 01/01/23   Geroldine Berg, MD  ezetimibe -simvastatin  (VYTORIN ) 10-40 MG tablet Take 1 tablet by mouth daily. PATIENT MUST SCHEDULE APPOINTMENT FOR FUTURE REFILLS. FIRST ATTEMPT 01/15/21   Mona Vinie BROCKS, MD  ferrous sulfate  325 (65 FE) MG tablet Take 1 tablet (325 mg total) by mouth daily with breakfast. 08/08/21   Tobie Yetta HERO, MD  fluticasone  (FLONASE ) 50 MCG/ACT nasal spray Place 1 spray into both nostrils daily. Patient taking differently: Place 1 spray into both nostrils daily as needed for allergies. 03/29/22   Hazen Darryle FORBES, FNP  insulin  glargine (LANTUS  SOLOSTAR) 100 UNIT/ML Solostar Pen Inject 20 Units into the skin in the morning. Patient taking differently: Inject 45 Units into the skin at bedtime. 08/07/21   Tobie Yetta HERO, MD  insulin  lispro (HUMALOG  KWIKPEN) 100 UNIT/ML KwikPen Inject 0-25 Units into the skin  See admin instructions. Inject 0-25 units into the skin three to four times a day, PER SLIDING SCALE    [provider]  levothyroxine  (SYNTHROID , LEVOTHROID) 200 MCG tablet Take 200 mcg by mouth daily before breakfast.     [provider]  losartan  (COZAAR ) 100 MG tablet Take 1 tablet (100 mg total) by mouth daily. 02/09/20 08/04/21  Shona Terry SAILOR, DO  losartan  (COZAAR ) 100 MG tablet Take 100 mg by mouth daily.    [provider]  metFORMIN (GLUCOPHAGE) 1000 MG tablet Take 1,000 mg by mouth 2 (two) times daily with a meal.    [provider]  metoprolol  succinate (TOPROL -XL) 25 MG 24 hr tablet Take 25 mg by mouth daily.     [provider]  Mouthwash Compounding Base LIQD Swish and spit 15 mLs every 6 (six) hours as needed. Maalox 80 mL; nystatin  100,000 units/ML  80 mL; viscous lidocaine  2%- 80ml 06/23/20   Lamptey, Aleene KIDD, MD  nystatin  (MYCOSTATIN ) 100000 UNIT/ML suspension Take 5 mLs (500,000 Units total) by mouth 4 (four) times daily. 08/06/20   Phebe Fonda RAMAN, MD  ondansetron  (ZOFRAN -ODT) 8 MG disintegrating tablet 8mg  ODT q8 hours prn nausea 03/15/23   Palumbo, April, MD  [facility] ULTRA test strip  01/25/20   [provider]  pantoprazole  (PROTONIX ) 40 MG tablet Take 1 tablet (40 mg total) by mouth daily at 6 (six) AM. Patient taking differently: Take 40 mg by mouth daily as needed (heartburn). 01/08/18   Regalado, Belkys A, MD  polyethylene glycol (MIRALAX  / GLYCOLAX ) 17 g packet Take 17 g by mouth daily as needed for mild constipation. 08/07/21   Tobie Yetta HERO, MD  RESTASIS 0.05 % ophthalmic emulsion Place 1 drop into both eyes 2 (two) times daily.    [provider]  sucralfate  (CARAFATE ) 1 GM/10ML suspension Take 10 mLs (1 g total) by mouth 4 (four) times daily -  with meals and at bedtime. 03/15/23   Palumbo, April, MD  traZODone  (DESYREL ) 50 MG tablet Take 25 mg by mouth at bedtime.    [provider]  zinc  gluconate 50 MG tablet Take 50 mg by mouth daily.    [provider]    Allergies: Patient has no known allergies.    Review of Systems  Neurological:  Positive for weakness.    Updated Vital Signs BP (!) 186/67   Pulse 95   Temp 98.2 F (36.8 C) (Oral)   Resp 16   Ht 5' 5 (1.651 m)   Wt 91.5 kg   SpO2 95%   BMI 33.57 kg/m   Physical Exam Vitals and nursing note reviewed.  Cardiovascular:     Rate and Rhythm: Normal rate and regular rhythm.  Pulmonary:     Breath sounds: No wheezing.  Abdominal:     Tenderness: There is no abdominal tenderness.  Skin:    General: Skin is warm.  Neurological:     Mental Status: She is alert.      Comments:  awake and pleasant, but somewhat slow to answer.     (all labs ordered are listed, but only abnormal results are displayed) Labs Reviewed  BASIC METABOLIC PANEL WITH GFR - Abnormal; Notable for the following components:      Result Value   Sodium 131 (*)    Potassium 5.6 (*)    Chloride 92 (*)    CO2 10 (*)    Glucose, Bld 630 (*)    BUN 39 (*)  Creatinine, Ser 2.33 (*)    Calcium 10.5 (*)    GFR, Estimated 22 (*)    Anion gap 29 (*)    All other components within normal limits  BETA-HYDROXYBUTYRIC ACID - Abnormal; Notable for the following components:   Beta-Hydroxybutyric Acid >8.00 (*)    All other components within normal limits  CBC WITH DIFFERENTIAL/PLATELET - Abnormal; Notable for the following components:   WBC 12.2 (*)    Neutro Abs 10.0 (*)    Abs Immature Granulocytes 0.08 (*)    All other components within normal limits  BLOOD GAS, VENOUS - Abnormal; Notable for the following components:   pH, Ven 7.21 (*)    pCO2, Ven 25 (*)    pO2, Ven 49 (*)    Bicarbonate 10.0 (*)    Acid-base deficit 16.2 (*)    All other components within normal limits  GLUCOSE, CAPILLARY - Abnormal; Notable for the following components:   Glucose-Capillary 299 (*)    All other components within normal limits  GLUCOSE, CAPILLARY - Abnormal; Notable for the following components:   Glucose-Capillary 165 (*)    All other components within normal limits  CBG MONITORING, ED - Abnormal; Notable for the following components:   Glucose-Capillary >600 (*)    All other components within normal limits  MRSA NEXT GEN BY PCR, NASAL  LIPASE, BLOOD  BASIC METABOLIC PANEL WITH GFR  BASIC METABOLIC PANEL WITH GFR  BETA-HYDROXYBUTYRIC ACID  BETA-HYDROXYBUTYRIC ACID  BETA-HYDROXYBUTYRIC ACID  URINALYSIS, ROUTINE W REFLEX MICROSCOPIC  BASIC METABOLIC PANEL WITH GFR  BETA-HYDROXYBUTYRIC ACID  TSH  HEMOGLOBIN A1C  BASIC METABOLIC PANEL WITH GFR  BETA-HYDROXYBUTYRIC ACID  CBG  MONITORING, ED    EKG: None  Radiology: No results found.   Procedures   Medications Ordered in the ED  lactated ringers  bolus 1,950 mL (1,950 mLs Intravenous Not Given 08/22/23 2004)  insulin  regular, human (MYXREDLIN ) 100 units/ 100 mL infusion (11.5 Units/hr Intravenous New Bag/Given 08/22/23 2037)  lactated ringers  infusion ( Intravenous New Bag/Given 08/22/23 2025)  dextrose  5 % in lactated ringers  infusion ( Intravenous New Bag/Given 08/22/23 2300)  dextrose  50 % solution 0-50 mL (has no administration in time range)  acetaminophen  (TYLENOL ) tablet 500-1,000 mg (has no administration in time range)  pantoprazole  (PROTONIX ) EC tablet 40 mg (has no administration in time range)  albuterol  (PROVENTIL ) (2.5 MG/3ML) 0.083% nebulizer solution 3 mL (has no administration in time range)  amLODipine  (NORVASC ) tablet 5 mg (5 mg Oral Given 08/22/23 2205)  metoprolol  succinate (TOPROL -XL) 24 hr tablet 25 mg (25 mg Oral Given 08/22/23 2206)  ondansetron  (ZOFRAN -ODT) disintegrating tablet 4 mg (has no administration in time range)  traZODone  (DESYREL ) tablet 25 mg (25 mg Oral Not Given 08/22/23 2207)  aspirin  chewable tablet 81 mg (has no administration in time range)  levothyroxine  (SYNTHROID ) tablet 200 mcg (has no administration in time range)  heparin  injection 5,000 Units (5,000 Units Subcutaneous Given 08/22/23 2207)  acetaminophen  (TYLENOL ) tablet 650 mg (has no administration in time range)    Or  acetaminophen  (TYLENOL ) suppository 650 mg (has no administration in time range)  fentaNYL  (SUBLIMAZE ) injection 12.5-50 mcg (has no administration in time range)  polyethylene glycol (MIRALAX  / GLYCOLAX ) packet 17 g (has no administration in time range)  metoprolol  tartrate (LOPRESSOR ) injection 5 mg (has no administration in time range)  ezetimibe  (ZETIA ) tablet 10 mg (has no administration in time range)    And  simvastatin  (ZOCOR ) tablet 40 mg (has no administration in time  range)  Chlorhexidine   Gluconate Cloth 2 % PADS 6 each (6 each Topical Given 08/22/23 2147)  lactated ringers  bolus 1,950 mL (0 mLs Intravenous Stopped 08/22/23 2139)  ondansetron  (ZOFRAN ) injection 4 mg (4 mg Intravenous Given 08/22/23 1837)                                    Medical Decision Making Amount and/or Complexity of Data Reviewed Labs: ordered.  Risk Prescription drug management. Decision regarding hospitalization.   Patient nausea vomiting weakness.  History of noncompliance with her insulin  regimen.  The last time sugar was checked.  Upon arrival has a sugar of greater than 600.     DKA versus hyperglycemia.  Will order basic blood work.  Will give fluid bolus.  Patient found to be in DKA.  Kidney injury and mildly elevated creatinine.  Started on insulin  drip after fluid bolus.  Admit to internal medicine.  CRITICAL CARE Performed by: [place] Total critical care time: 30 minutes Critical care time was exclusive of separately billable procedures and treating other patients. Critical care was necessary to treat or prevent imminent or life-threatening deterioration. Critical care was time spent personally by me on the following activities: development of treatment plan with patient and/or surrogate as well as nursing, discussions with consultants, evaluation of patient's response to treatment, examination of patient, obtaining history from patient or surrogate, ordering and performing treatments and interventions, ordering and review of laboratory studies, ordering and review of radiographic studies, pulse oximetry and re-evaluation of patient's condition.      Final diagnoses:  Diabetic ketoacidosis without coma associated with type 2 diabetes mellitus (HCC)  AKI (acute kidney injury) [facility])    ED Discharge Orders     None          River Rankin, MD 08/22/23 2319

## 2023-08-22 NOTE — Assessment & Plan Note (Signed)
 Continue Synthroid.  Check TSH.

## 2023-08-22 NOTE — H&P (Signed)
 History and Physical    Patient: [name] FMW:[id number] DOB: May 18, 1950 DOA: 08/22/2023 DOS: the patient was seen and examined on 08/22/2023 PCP: Rik Glinda DASEN, FNP  Patient coming from: Home  Chief Complaint:  Chief Complaint  Patient presents with   Weakness   HPI: [name] is a 73 y.o.T2DM, HTN, hypothyroid who apparently has not been taking the best care of herself.  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  Patient noted that she felt weak and unlike herself had increasing nausea and vomiting today.  She reports being out of her Lantus  at home for a while.  Her son who is a type I diabetic gave her some insulin  which she took today prior to presenting to the ED.  In the ED patient was noted to be in profound DKA with a bicarb of 10.  Her initial CBG was 630.  She also was found to have acute on chronic renal insufficiency with creatinine of 2.33. She was also found to have mild hyperkalemia at 5.6 as well as hyponatremia. female with medical history significant of   Review of Systems: As mentioned in the history of present illness. All other systems reviewed and are negative. Past Medical History:  Diagnosis Date   Clotting disorder (HCC)    Constipation    history of   Diabetes mellitus without complication (HCC)    DVT (deep venous thrombosis) (HCC)    2007   Factor V Leiden (HCC)    deficiency    GERD (gastroesophageal reflux disease)    Hypertension    Hypothyroidism    Kidney disease    early stage   NSTEMI (non-ST elevated myocardial infarction) (HCC)    2011 - possibly r/t DVT/embolization?    Sleep apnea    no c-pap   Past Surgical History:  Procedure Laterality Date   CARDIAC CATHETERIZATION  10/21/2009   mild bridging(?) segment in mid LAD, otherwise normal coronaries (Dr. LOIS Maxcy)   COLONOSCOPY     INGUINAL HERNIA REPAIR     when 6 months old   LOWER EXTREMITY ARTERIAL DOPPLER  2011   normal LEA    SLEEP STUDY  2011   AHI 8.5 and REM AHI 18   TRANSTHORACIC ECHOCARDIOGRAM  2011   EF 55-60%, trivial MR, TR, pulm valve regurg   TUBAL LIGATION  1995   UPPER GASTROINTESTINAL ENDOSCOPY     Social History:  reports that she has never smoked. She has never used smokeless tobacco. She reports current alcohol  use. She reports that she does not use drugs.  No Known Allergies  Family History  Problem Relation Age of Onset   Lung cancer Father    Cancer Father    Diabetes Father    Other Mother        childbirth - enlarged heart   Cancer Paternal Grandmother    Diabetes Paternal Grandmother    Mental illness Paternal Grandmother    Hypertension Paternal Grandfather    Colon cancer Neg Hx     Prior to Admission medications   Medication Sig Start Date End Date Taking? Authorizing Provider  acetaminophen  (TYLENOL ) 500 MG tablet Take 500-1,000 mg by mouth every 6 (six) hours as needed for mild pain or headache.    [provider]  albuterol  (VENTOLIN  HFA) 108 (90 Base) MCG/ACT inhaler Inhale 2 puffs into the lungs every 6 (six) hours as needed for wheezing or shortness of breath.  02/08/20   Shona Terry SAILOR, DO  amLODipine  (NORVASC ) 5 MG tablet Take 1 tablet (5 mg total) by mouth daily. 08/08/21   Tobie Yetta HERO, MD  Ascorbic Acid  (VITAMIN C) 1000 MG tablet Take 1,000 mg by mouth daily.    [provider]  ASPIRIN  81 PO Take 81 mg by mouth daily.    [provider]  benzonatate  (TESSALON ) 200 MG capsule Take 1 capsule (200 mg total) by mouth every 8 (eight) hours. 03/12/23   Dreama, Georgia  N, FNP  Blood Glucose Monitoring Suppl (ONETOUCH VERIO) w/Device KIT See admin instructions. 02/14/20   [provider]  cyclobenzaprine  (FLEXERIL ) 10 MG tablet Take 1 tablet (10 mg total) by mouth 3 (three) times daily as needed for muscle spasms. 01/01/23   Geroldine Berg, MD  ezetimibe -simvastatin  (VYTORIN ) 10-40 MG tablet Take 1 tablet by mouth daily. PATIENT MUST  SCHEDULE APPOINTMENT FOR FUTURE REFILLS. FIRST ATTEMPT 01/15/21   Mona Vinie BROCKS, MD  ferrous sulfate  325 (65 FE) MG tablet Take 1 tablet (325 mg total) by mouth daily with breakfast. 08/08/21   Tobie Yetta HERO, MD  fluticasone  (FLONASE ) 50 MCG/ACT nasal spray Place 1 spray into both nostrils daily. Patient taking differently: Place 1 spray into both nostrils daily as needed for allergies. 03/29/22   Hazen Darryle BRAVO, FNP  insulin  glargine (LANTUS  SOLOSTAR) 100 UNIT/ML Solostar Pen Inject 20 Units into the skin in the morning. Patient taking differently: Inject 45 Units into the skin at bedtime. 08/07/21   Tobie Yetta HERO, MD  insulin  lispro (HUMALOG  KWIKPEN) 100 UNIT/ML KwikPen Inject 0-25 Units into the skin See admin instructions. Inject 0-25 units into the skin three to four times a day, PER SLIDING SCALE    [provider]  levothyroxine  (SYNTHROID ) 25 MCG tablet Take 1 tablet (25 mcg total) by mouth daily before breakfast. 09/16/22   Rancour, Garnette, MD  levothyroxine  (SYNTHROID , LEVOTHROID) 200 MCG tablet Take 200 mcg by mouth daily before breakfast.     [provider]  losartan  (COZAAR ) 100 MG tablet Take 1 tablet (100 mg total) by mouth daily. 02/09/20 08/04/21  Shona Terry SAILOR, DO  losartan  (COZAAR ) 100 MG tablet Take 100 mg by mouth daily.    [provider]  metoprolol  succinate (TOPROL -XL) 25 MG 24 hr tablet Take 25 mg by mouth daily.    [provider]  Mouthwash Compounding Base LIQD Swish and spit 15 mLs every 6 (six) hours as needed. Maalox 80 mL; nystatin  100,000 units/ML  80 mL; viscous lidocaine  2%- 80ml 06/23/20   Lamptey, Aleene KIDD, MD  nystatin  (MYCOSTATIN ) 100000 UNIT/ML suspension Take 5 mLs (500,000 Units total) by mouth 4 (four) times daily. 08/06/20   Phebe Fonda RAMAN, MD  ondansetron  (ZOFRAN -ODT) 8 MG disintegrating tablet 8mg  ODT q8 hours prn nausea 03/15/23   Palumbo, April, MD  [facility] ULTRA test strip  01/25/20   [provider]   pantoprazole  (PROTONIX ) 40 MG tablet Take 1 tablet (40 mg total) by mouth daily at 6 (six) AM. Patient taking differently: Take 40 mg by mouth daily as needed (heartburn). 01/08/18   Regalado, Belkys A, MD  polyethylene glycol (MIRALAX  / GLYCOLAX ) 17 g packet Take 17 g by mouth daily as needed for mild constipation. 08/07/21   Tobie Yetta HERO, MD  sucralfate  (CARAFATE ) 1 GM/10ML suspension Take 10 mLs (1 g total) by mouth 4 (four) times daily -  with meals and at bedtime. 03/15/23   Palumbo, April, MD  zinc  gluconate 50  MG tablet Take 50 mg by mouth daily.    [provider]    Physical Exam: Vitals:   08/22/23 1625 08/22/23 1935 08/22/23 2000  BP: 100/72 (!) 184/68 (!) 186/64  Pulse: 100 98 95  Resp: (!) 22 20 19   Temp: 98 F (36.7 C)  98.2 F (36.8 C)  TempSrc: Oral    SpO2: 100% 100% 100%  Weight: 97.5 kg    Height: 5' 5 (1.651 m)     Physical Examination: General appearance - chronically ill appearing Neck - supple, no significant adenopathy Chest - clear to auscultation, no wheezes, rales or rhonchi, symmetric air entry Heart - normal rate, regular rhythm, normal S1, S2, no murmurs, rubs, clicks or gallops Abdomen - soft, nontender, nondistended, no masses or organomegaly Extremities - peripheral pulses normal, no pedal edema, no clubbing or cyanosis Skin - normal coloration and turgor, no rashes, no suspicious skin lesions noted  Data Reviewed: Results for orders placed or performed during the hospital encounter of 08/22/23 (from the past 24 hours)  CBG monitoring, ED     Status: Abnormal   Collection Time: 08/22/23  4:31 PM  Result Value Ref Range   Glucose-Capillary >600 (HH) 70 - 99 mg/dL  Basic metabolic panel     Status: Abnormal   Collection Time: 08/22/23  6:35 PM  Result Value Ref Range   Sodium 131 (L) 135 - 145 mmol/L   Potassium 5.6 (H) 3.5 - 5.1 mmol/L   Chloride 92 (L) 98 - 111 mmol/L   CO2 10 (L) 22 - 32 mmol/L   Glucose, Bld 630 (HH) 70 - 99  mg/dL   BUN 39 (H) 8 - 23 mg/dL   Creatinine, Ser 7.66 (H) 0.44 - 1.00 mg/dL   Calcium 89.4 (H) 8.9 - 10.3 mg/dL   GFR, Estimated 22 (L) >60 mL/min   Anion gap 29 (H) 5 - 15  CBC with Differential (PNL)     Status: Abnormal   Collection Time: 08/22/23  6:35 PM  Result Value Ref Range   WBC 12.2 (H) 4.0 - 10.5 K/uL   RBC 4.72 3.87 - 5.11 MIL/uL   Hemoglobin 13.3 12.0 - 15.0 g/dL   HCT 59.4 63.9 - 53.9 %   MCV 85.8 80.0 - 100.0 fL   MCH 28.2 26.0 - 34.0 pg   MCHC 32.8 30.0 - 36.0 g/dL   RDW 85.3 88.4 - 84.4 %   Platelets 363 150 - 400 K/uL   nRBC 0.0 0.0 - 0.2 %   Neutrophils Relative % 83 %   Neutro Abs 10.0 (H) 1.7 - 7.7 K/uL   Lymphocytes Relative 10 %   Lymphs Abs 1.3 0.7 - 4.0 K/uL   Monocytes Relative 6 %   Monocytes Absolute 0.8 0.1 - 1.0 K/uL   Eosinophils Relative 0 %   Eosinophils Absolute 0.0 0.0 - 0.5 K/uL   Basophils Relative 0 %   Basophils Absolute 0.0 0.0 - 0.1 K/uL   Immature Granulocytes 1 %   Abs Immature Granulocytes 0.08 (H) 0.00 - 0.07 K/uL  Blood gas, venous     Status: Abnormal   Collection Time: 08/22/23  6:35 PM  Result Value Ref Range   pH, Ven 7.21 (L) 7.25 - 7.43   pCO2, Ven 25 (L) 44 - 60 mmHg   pO2, Ven 49 (H) 32 - 45 mmHg   Bicarbonate 10.0 (L) 20.0 - 28.0 mmol/L   Acid-base deficit 16.2 (H) 0.0 - 2.0 mmol/L   O2 Saturation  81.5 %   Patient temperature 37.0   Lipase, blood     Status: None   Collection Time: 08/22/23  6:35 PM  Result Value Ref Range   Lipase 27 11 - 51 U/L     Assessment and Plan: * DKA (diabetic ketoacidosis) (HCC) IV fluid bolus is completed in the ED Aggressive IV fluid resuscitation Endo tool with insulin  gtt. Serial labs  Acute renal failure superimposed on stage 3a chronic kidney disease (HCC) Avoid nephrotoxic agents IV hydration Will likely correct with correction of her acidemia Trend  Essential hypertension Continue amlodipine , metoprolol   Hypothyroidism Continue Synthroid  Check TSH  GERD  without esophagitis Continue PPI  Venous insufficiency (chronic) (peripheral) Likely related to longstanding T2DM  Sleep apnea CPAP if needed or evening oxygen  if needed  Hyperlipidemia Continue Vytorin   History of DVT (deep vein thrombosis) On heparin  prophylaxis while in the hospital  Hx of non-ST elevation myocardial infarction (NSTEMI) Continue aggressive lipid-lowering agents Continue beta-blockers  Insulin -requiring or dependent type II diabetes mellitus (HCC) Usually on metformin and insulin  Holding this until DKA is corrected.      Advance Care Planning:   Code Status: Prior full  Consults: None  Family Communication: Patient's friend at bedside  Severity of Illness: The appropriate patient status for this patient is OBSERVATION. Observation status is judged to be reasonable and necessary in order to provide the required intensity of service to ensure the patient's safety. The patient's presenting symptoms, physical exam findings, and initial radiographic and laboratory data in the context of their medical condition is felt to place them at decreased risk for further clinical deterioration. Furthermore, it is anticipated that the patient will be medically stable for discharge from the hospital within 2 midnights of admission.   Author: Glenys GORMAN Birk, MD 08/22/2023 8:36 PM  For on call review [email or web address].

## 2023-08-22 NOTE — Assessment & Plan Note (Signed)
 IV fluid bolus is completed in the ED Aggressive IV fluid resuscitation Endo tool with insulin  gtt. Serial labs

## 2023-08-22 NOTE — Assessment & Plan Note (Signed)
 Continue aggressive lipid-lowering agents Continue beta-blockers

## 2023-08-22 NOTE — Assessment & Plan Note (Signed)
 Avoid nephrotoxic agents IV hydration Will likely correct with correction of her acidemia Trend

## 2023-08-22 NOTE — Assessment & Plan Note (Signed)
 Continue PPI.

## 2023-08-22 NOTE — Assessment & Plan Note (Signed)
 -  Continue amlodipine, metoprolol

## 2023-08-22 NOTE — Assessment & Plan Note (Signed)
 On heparin  prophylaxis

## 2023-08-22 NOTE — Assessment & Plan Note (Signed)
 On heparin  prophylaxis while in the hospital

## 2023-08-22 NOTE — ED Notes (Signed)
 IV attempted x2- unsuccessful.

## 2023-08-23 DIAGNOSIS — E039 Hypothyroidism, unspecified: Secondary | ICD-10-CM

## 2023-08-23 DIAGNOSIS — E785 Hyperlipidemia, unspecified: Secondary | ICD-10-CM

## 2023-08-23 DIAGNOSIS — E1110 Type 2 diabetes mellitus with ketoacidosis without coma: Secondary | ICD-10-CM | POA: Diagnosis not present

## 2023-08-23 DIAGNOSIS — N1831 Chronic kidney disease, stage 3a: Secondary | ICD-10-CM

## 2023-08-23 DIAGNOSIS — Z86718 Personal history of other venous thrombosis and embolism: Secondary | ICD-10-CM

## 2023-08-23 DIAGNOSIS — N179 Acute kidney failure, unspecified: Secondary | ICD-10-CM | POA: Diagnosis not present

## 2023-08-23 LAB — BASIC METABOLIC PANEL WITH GFR
Anion gap: 20 — ABNORMAL HIGH (ref 5–15)
Anion gap: 16 — ABNORMAL HIGH (ref 5–15)
Anion gap: 11 (ref 5–15)
BUN: 34 mg/dL — ABNORMAL HIGH (ref 8–23)
BUN: 35 mg/dL — ABNORMAL HIGH (ref 8–23)
BUN: 31 mg/dL — ABNORMAL HIGH (ref 8–23)
CO2: 17 mmol/L — ABNORMAL LOW (ref 22–32)
CO2: 21 mmol/L — ABNORMAL LOW (ref 22–32)
CO2: 22 mmol/L (ref 22–32)
Calcium: 10.2 mg/dL (ref 8.9–10.3)
Calcium: 10 mg/dL (ref 8.9–10.3)
Calcium: 9.5 mg/dL (ref 8.9–10.3)
Chloride: 101 mmol/L (ref 98–111)
Chloride: 102 mmol/L (ref 98–111)
Chloride: 104 mmol/L (ref 98–111)
Creatinine, Ser: 1.98 mg/dL — ABNORMAL HIGH (ref 0.44–1.00)
Creatinine, Ser: 1.83 mg/dL — ABNORMAL HIGH (ref 0.44–1.00)
Creatinine, Ser: 1.65 mg/dL — ABNORMAL HIGH (ref 0.44–1.00)
GFR, Estimated: 26 mL/min — ABNORMAL LOW (ref >60)
GFR, Estimated: 29 mL/min — ABNORMAL LOW (ref >60)
GFR, Estimated: 33 mL/min — ABNORMAL LOW (ref >60)
Glucose, Bld: 164 mg/dL — ABNORMAL HIGH (ref 70–99)
Glucose, Bld: 75 mg/dL (ref 70–99)
Glucose, Bld: 128 mg/dL — ABNORMAL HIGH (ref 70–99)
Potassium: 4.5 mmol/L (ref 3.5–5.1)
Potassium: 3.9 mmol/L (ref 3.5–5.1)
Potassium: 3.9 mmol/L (ref 3.5–5.1)
Sodium: 138 mmol/L (ref 135–145)
Sodium: 139 mmol/L (ref 135–145)
Sodium: 137 mmol/L (ref 135–145)

## 2023-08-23 LAB — GLUCOSE, CAPILLARY
Glucose-Capillary: 106 mg/dL — ABNORMAL HIGH (ref 70–99)
Glucose-Capillary: 114 mg/dL — ABNORMAL HIGH (ref 70–99)
Glucose-Capillary: 128 mg/dL — ABNORMAL HIGH (ref 70–99)
Glucose-Capillary: 129 mg/dL — ABNORMAL HIGH (ref 70–99)
Glucose-Capillary: 140 mg/dL — ABNORMAL HIGH (ref 70–99)
Glucose-Capillary: 142 mg/dL — ABNORMAL HIGH (ref 70–99)
Glucose-Capillary: 154 mg/dL — ABNORMAL HIGH (ref 70–99)
Glucose-Capillary: 154 mg/dL — ABNORMAL HIGH (ref 70–99)
Glucose-Capillary: 154 mg/dL — ABNORMAL HIGH (ref 70–99)
Glucose-Capillary: 222 mg/dL — ABNORMAL HIGH (ref 70–99)
Glucose-Capillary: 63 mg/dL — ABNORMAL LOW (ref 70–99)
Glucose-Capillary: 69 mg/dL — ABNORMAL LOW (ref 70–99)
Glucose-Capillary: 74 mg/dL (ref 70–99)
Glucose-Capillary: 79 mg/dL (ref 70–99)
Glucose-Capillary: 81 mg/dL (ref 70–99)
Glucose-Capillary: 83 mg/dL (ref 70–99)
Glucose-Capillary: 87 mg/dL (ref 70–99)
Glucose-Capillary: 94 mg/dL (ref 70–99)
Glucose-Capillary: 97 mg/dL (ref 70–99)

## 2023-08-23 LAB — BETA-HYDROXYBUTYRIC ACID
Beta-Hydroxybutyric Acid: 4.34 mmol/L — ABNORMAL HIGH (ref 0.05–0.27)
Beta-Hydroxybutyric Acid: 1.25 mmol/L — ABNORMAL HIGH (ref 0.05–0.27)

## 2023-08-23 LAB — TSH: TSH: 9.091 u[IU]/mL — ABNORMAL HIGH (ref 0.350–4.500)

## 2023-08-23 LAB — HEMOGLOBIN A1C
Hgb A1c MFr Bld: 13.3 % — ABNORMAL HIGH (ref 4.8–5.6)
Mean Plasma Glucose: 335.01 mg/dL

## 2023-08-23 MED ORDER — INSULIN GLARGINE-YFGN 100 UNIT/ML ~~LOC~~ SOLN
10.0000 [IU] | Freq: Every day | SUBCUTANEOUS | Status: DC
  Administered 2023-08-23: 10 [IU] via SUBCUTANEOUS
  Filled 2023-08-23 (×2): qty 0.1

## 2023-08-23 MED ORDER — INSULIN ASPART 100 UNIT/ML IJ SOLN
0.0000 [IU] | Freq: Three times a day (TID) | INTRAMUSCULAR | Status: DC
  Administered 2023-08-24: 3 [IU] via SUBCUTANEOUS
  Administered 2023-08-24: 2 [IU] via SUBCUTANEOUS
  Administered 2023-08-24: 15 [IU] via SUBCUTANEOUS
  Administered 2023-08-25: 8 [IU] via SUBCUTANEOUS
  Administered 2023-08-25 – 2023-08-26 (×3): 5 [IU] via SUBCUTANEOUS
  Administered 2023-08-26: 11 [IU] via SUBCUTANEOUS

## 2023-08-23 NOTE — ED Notes (Signed)
 Opened chart 08/23/23 1120 opened chart to try and locate her phone with a pink cover. Unable to find information with chart review.

## 2023-08-23 NOTE — Hospital Course (Addendum)
 [name] is a 73 y.o. female with a history of diabetes mellitus type 2, hypertension, hypothyroidism, hyperlipidemia, OSA, CKD.  Patient presented secondary to weakness and found to have evidence of hyperglycemia and DKA. IV insulin  and IV fluids started.

## 2023-08-23 NOTE — Progress Notes (Signed)
 PROGRESS NOTE    [name]  FMW:[id number] DOB: [birth year] DOA: 08/22/2023 PCP: Rik Glinda DASEN, FNP   Brief Narrative: [name] is a 73 y.o. female with a history of diabetes mellitus type 2, hypertension, hypothyroidism, hyperlipidemia, OSA, CKD.  Patient presented secondary to weakness and found to have evidence of hyperglycemia and DKA. IV insulin  and IV fluids started.   Assessment and Plan:  DKA It appears this is secondary to medication nonadherence. Patient has not been taking her insulin  glargine. Patient found to have evidence of acidosis with associated hyperglycemia. IV insulin  started for treatment. -Transition to subcutaneous insulin  this afternoon: Semglee  10 units daily and SSI -Transition off of IV insulin  -Carb modified diet  AKI on CKD stage IIIa Baseline creatinine appears to be around 1.2. Creatinine of 2.33 on admission. Improving with IV fluids. -BMP in AM  Primary hypertension Patient is on amlodipine  and losartan  as an outpatient. Losartan  held secondary to AKI. -Continue amlodipine   Hypothyroidism -Continue Synthroid   GERD without esophagitis -Continue Protonix   Venous insufficiency Noted.  Obstructive sleep apnea -CPAP qhs  Hyperlipidemia Patient is on Vytorin  as an outpatient. Continued on admission.  History of DVT No longer on anticoagulation.  History of NSTEMI Noted. EKG with non-specific anterolateral t-wave changes. No chest pain.   DVT prophylaxis: Heparin  Code Status:   Code Status: Full Code Family Communication: None at bedside Disposition Plan: Discharge home likely in 1-2 days pending stable blood sugar on subcutaneous insulin    Consultants:  None  Procedures:  None  Antimicrobials: None    Subjective: Patient reports a headache. No other issues overnight. No nausea or vomiting.  Objective: BP (!) 143/69 (BP Location: Right Arm)   Pulse 81   Temp 98.4 F (36.9 C) (Oral)   Resp 16   Ht 5'  5 (1.651 m)   Wt 91.5 kg   SpO2 95%   BMI 33.57 kg/m   Examination:  General exam: Appears calm and comfortable Respiratory system: Clear to auscultation. Respiratory effort normal. Cardiovascular system: S1 & S2 heard, RRR. No murmurs, rubs, gallops or clicks. Gastrointestinal system: Abdomen is nondistended, soft and nontender. Normal bowel sounds heard. Central nervous system: Alert and oriented. No focal neurological deficits. Musculoskeletal: No edema. No calf tenderness Skin: No cyanosis. No rashes Psychiatry: Judgement and insight appear normal. Mood & affect appropriate.    Data Reviewed: I have personally reviewed following labs and imaging studies  CBC Lab Results  Component Value Date   WBC 12.2 (H) 08/22/2023   RBC 4.72 08/22/2023   HGB 13.3 08/22/2023   HCT 40.5 08/22/2023   MCV 85.8 08/22/2023   MCH 28.2 08/22/2023   PLT 363 08/22/2023   MCHC 32.8 08/22/2023   RDW 14.6 08/22/2023   LYMPHSABS 1.3 08/22/2023   MONOABS 0.8 08/22/2023   EOSABS 0.0 08/22/2023   BASOSABS 0.0 08/22/2023     Last metabolic panel Lab Results  Component Value Date   NA 139 08/23/2023   K 3.9 08/23/2023   CL 102 08/23/2023   CO2 21 (L) 08/23/2023   BUN 35 (H) 08/23/2023   CREATININE 1.83 (H) 08/23/2023   GLUCOSE 75 08/23/2023   GFRNONAA 29 (L) 08/23/2023   GFRAA >60 01/07/2018   CALCIUM 10.0 08/23/2023   PHOS 3.7 02/07/2020   PROT 6.9 08/05/2021   ALBUMIN 2.9 (L) 08/05/2021   BILITOT 0.9 08/05/2021   ALKPHOS 66 08/05/2021   AST 34 08/05/2021   ALT 14 08/05/2021   ANIONGAP 16 (H)  08/23/2023    GFR: Estimated Creatinine Clearance: 31.1 mL/min (A) (by C-G formula based on SCr of 1.83 mg/dL (H)).  Recent Results (from the past 240 hours)  MRSA Next Gen by PCR, Nasal     Status: None   Collection Time: 08/22/23  9:50 PM   Specimen: Nasal Mucosa; Nasal Swab  Result Value Ref Range Status   MRSA by PCR Next Gen NOT DETECTED NOT DETECTED Final    Comment: (NOTE) The  GeneXpert MRSA Assay (FDA approved for NASAL specimens only), is one component of a comprehensive MRSA colonization surveillance program. It is not intended to diagnose MRSA infection nor to guide or monitor treatment for MRSA infections. Test performance is not FDA approved in patients less than 44 years old. Performed at [facility], [address], [place], [place] [address]       Radiology Studies: No results found.    LOS: 1 day    Elgin Lam, MD [facility] 08/23/2023, 9:29 AM   If 7PM-7AM, please contact night-coverage [email or web address]

## 2023-08-23 NOTE — Progress Notes (Signed)
 Hypoglycemic Event  CBG: 69  Treatment: D50 50 mL (25 gm)  Symptoms: None  Follow-up CBG: Time: 0910 CBG Result:154  Possible Reasons for Event: Unknown  Comments/MD notified: Yes    [employer]

## 2023-08-24 ENCOUNTER — Other Ambulatory Visit: Payer: Self-pay

## 2023-08-24 DIAGNOSIS — E785 Hyperlipidemia, unspecified: Secondary | ICD-10-CM | POA: Diagnosis not present

## 2023-08-24 DIAGNOSIS — N179 Acute kidney failure, unspecified: Secondary | ICD-10-CM | POA: Diagnosis not present

## 2023-08-24 DIAGNOSIS — E039 Hypothyroidism, unspecified: Secondary | ICD-10-CM | POA: Diagnosis not present

## 2023-08-24 DIAGNOSIS — E1110 Type 2 diabetes mellitus with ketoacidosis without coma: Secondary | ICD-10-CM | POA: Diagnosis not present

## 2023-08-24 LAB — URINALYSIS, W/ REFLEX TO CULTURE (INFECTION SUSPECTED)
Bilirubin Urine: NEGATIVE
Glucose, UA: >=500 mg/dL — AB
Hgb urine dipstick: NEGATIVE
Ketones, ur: NEGATIVE mg/dL
Nitrite: NEGATIVE
Protein, ur: NEGATIVE mg/dL
Specific Gravity, Urine: 1.013 (ref 1.005–1.030)
pH: 5 (ref 5.0–8.0)

## 2023-08-24 LAB — BASIC METABOLIC PANEL WITH GFR
Anion gap: 14 (ref 5–15)
BUN: 22 mg/dL (ref 8–23)
CO2: 19 mmol/L — ABNORMAL LOW (ref 22–32)
Calcium: 9 mg/dL (ref 8.9–10.3)
Chloride: 103 mmol/L (ref 98–111)
Creatinine, Ser: 1.38 mg/dL — ABNORMAL HIGH (ref 0.44–1.00)
GFR, Estimated: 41 mL/min — ABNORMAL LOW (ref >60)
Glucose, Bld: 236 mg/dL — ABNORMAL HIGH (ref 70–99)
Potassium: 3.9 mmol/L (ref 3.5–5.1)
Sodium: 136 mmol/L (ref 135–145)

## 2023-08-24 LAB — GLUCOSE, CAPILLARY
Glucose-Capillary: 190 mg/dL — ABNORMAL HIGH (ref 70–99)
Glucose-Capillary: 380 mg/dL — ABNORMAL HIGH (ref 70–99)
Glucose-Capillary: 138 mg/dL — ABNORMAL HIGH (ref 70–99)
Glucose-Capillary: 169 mg/dL — ABNORMAL HIGH (ref 70–99)

## 2023-08-24 MED ORDER — LOSARTAN POTASSIUM 50 MG PO TABS
100.0000 mg | ORAL_TABLET | Freq: Every day | ORAL | Status: DC
  Administered 2023-08-24 – 2023-08-26 (×3): 100 mg via ORAL
  Filled 2023-08-24 (×3): qty 2

## 2023-08-24 MED ORDER — INSULIN GLARGINE-YFGN 100 UNIT/ML ~~LOC~~ SOLN
20.0000 [IU] | Freq: Every day | SUBCUTANEOUS | Status: DC
  Administered 2023-08-24 – 2023-08-25 (×2): 20 [IU] via SUBCUTANEOUS
  Filled 2023-08-24 (×3): qty 0.2

## 2023-08-24 NOTE — Progress Notes (Signed)
 PROGRESS NOTE    [name]  FMW:[id number] DOB: Aug 28, 1950 DOA: 08/22/2023 PCP: Rik Glinda DASEN, FNP   Brief Narrative: [name] is a 73 y.o. female with a history of diabetes mellitus type 2, hypertension, hypothyroidism, hyperlipidemia, OSA, CKD.  Patient presented secondary to weakness and found to have evidence of hyperglycemia and DKA. IV insulin  and IV fluids started. Patient transitioned to subcutaneous insulin .   Assessment and Plan:  DKA It appears this is secondary to medication nonadherence. Patient has not been taking her insulin  glargine. Patient found to have evidence of acidosis with associated hyperglycemia. IV insulin  started for treatment and patient transitioned to subcutaneous insulin . -Increase to Semglee  20 units -Continue SSI -Carb modified diet  AKI on CKD stage IIIa Baseline creatinine appears to be around 1.2. Creatinine of 2.33 on admission. Improving with IV fluids. -BMP in AM -Restart IV fluids  Primary hypertension Patient is on amlodipine  and losartan  as an outpatient. Losartan  held secondary to AKI. Blood pressure not controlled. -Continue amlodipine  -Resume losartan   Hypothyroidism -Continue Synthroid   GERD without esophagitis -Continue Protonix   Venous insufficiency Noted.  Obstructive sleep apnea -CPAP qhs  Hyperlipidemia Patient is on Vytorin  as an outpatient. Continued on admission.  History of DVT No longer on anticoagulation.  History of NSTEMI Noted. EKG with non-specific anterolateral t-wave changes. No chest pain.   DVT prophylaxis: Heparin  Code Status:   Code Status: Full Code Family Communication: None at bedside Disposition Plan: Discharge home likely in 1-2 days pending stable blood sugar on subcutaneous insulin    Consultants:  None  Procedures:  None  Antimicrobials: None    Subjective: No specific issues overnight.  Objective: BP (!) 144/110 (BP Location: Right Arm)   Pulse 80    Temp 98.7 F (37.1 C) (Oral)   Resp 20   Ht 5' 5 (1.651 m)   Wt 91.5 kg   SpO2 100%   BMI 33.57 kg/m   Examination:  General exam: Appears calm and comfortable Respiratory system: Clear to auscultation. Respiratory effort normal. Cardiovascular system: S1 & S2 heard, RRR. Gastrointestinal system: Abdomen is nondistended, soft and nontender. Normal bowel sounds heard. Central nervous system: Alert and oriented. No focal neurological deficits. Psychiatry: Judgement and insight appear normal. Mood & affect appropriate.    Data Reviewed: I have personally reviewed following labs and imaging studies  CBC Lab Results  Component Value Date   WBC 12.2 (H) 08/22/2023   RBC 4.72 08/22/2023   HGB 13.3 08/22/2023   HCT 40.5 08/22/2023   MCV 85.8 08/22/2023   MCH 28.2 08/22/2023   PLT 363 08/22/2023   MCHC 32.8 08/22/2023   RDW 14.6 08/22/2023   LYMPHSABS 1.3 08/22/2023   MONOABS 0.8 08/22/2023   EOSABS 0.0 08/22/2023   BASOSABS 0.0 08/22/2023     Last metabolic panel Lab Results  Component Value Date   NA 136 08/24/2023   K 3.9 08/24/2023   CL 103 08/24/2023   CO2 19 (L) 08/24/2023   BUN 22 08/24/2023   CREATININE 1.38 (H) 08/24/2023   GLUCOSE 236 (H) 08/24/2023   GFRNONAA 41 (L) 08/24/2023   GFRAA >60 01/07/2018   CALCIUM 9.0 08/24/2023   PHOS 3.7 02/07/2020   PROT 6.9 08/05/2021   ALBUMIN 2.9 (L) 08/05/2021   BILITOT 0.9 08/05/2021   ALKPHOS 66 08/05/2021   AST 34 08/05/2021   ALT 14 08/05/2021   ANIONGAP 14 08/24/2023    GFR: Estimated Creatinine Clearance: 41.2 mL/min (A) (by C-G formula based on  SCr of 1.38 mg/dL (H)).  Recent Results (from the past 240 hours)  MRSA Next Gen by PCR, Nasal     Status: None   Collection Time: 08/22/23  9:50 PM   Specimen: Nasal Mucosa; Nasal Swab  Result Value Ref Range Status   MRSA by PCR Next Gen NOT DETECTED NOT DETECTED Final    Comment: (NOTE) The GeneXpert MRSA Assay (FDA approved for NASAL specimens only), is  one component of a comprehensive MRSA colonization surveillance program. It is not intended to diagnose MRSA infection nor to guide or monitor treatment for MRSA infections. Test performance is not FDA approved in patients less than 64 years old. Performed at [facility], [address], [place], [place] [address]       Radiology Studies: No results found.    LOS: 2 days    Elgin Lam, MD [facility] 08/24/2023, 3:35 PM   If 7PM-7AM, please contact night-coverage [email or web address]

## 2023-08-24 NOTE — Progress Notes (Signed)
   08/23/23 2300  BiPAP/CPAP/SIPAP  $ Non-Invasive Home Ventilator  Initial  BiPAP/CPAP/SIPAP Pt Type Adult  BiPAP/CPAP/SIPAP Resmed  Mask Type Nasal mask  Dentures removed? Not applicable  Mask Size Small  FiO2 (%) 21 %  Auto Titrate Yes  Minimum cmH2O 5 cmH2O  Maximum cmH2O 20 cmH2O  CPAP/SIPAP surface wiped down Yes  Device Plugged into RED Power Outlet Yes  BiPAP/CPAP /SiPAP Vitals  Pulse Rate 75  Resp 20  BP (!) 156/59  SpO2 94 %  MEWS Score/Color  MEWS Score 0  MEWS Score Color [place]

## 2023-08-24 NOTE — Plan of Care (Signed)

## 2023-08-24 NOTE — Progress Notes (Signed)
   08/24/23 2325  BiPAP/CPAP/SIPAP  Reason BIPAP/CPAP not in use Non-compliant (NURSE STATED THAT SHE DOES NOT WANT TO WEAR IT TONIGHT.)  BiPAP/CPAP /SiPAP Vitals  Resp 20  MEWS Score/Color  MEWS Score 0  MEWS Score Color Green

## 2023-08-24 NOTE — Progress Notes (Signed)
 Mobility Specialist - Progress Note   08/24/23 1035  Mobility  Activity Ambulated with assistance in hallway  Level of Assistance Modified independent, requires aide device or extra time  Assistive Device Front wheel walker  Distance Ambulated (ft) 250 ft  Activity Response Tolerated well  Mobility Referral Yes  Mobility visit 1 Mobility  Mobility Specialist Start Time (ACUTE ONLY) 1018  Mobility Specialist Stop Time (ACUTE ONLY) 1034  Mobility Specialist Time Calculation (min) (ACUTE ONLY) 16 min   Pt received in bed and agreeable to mobility. No complaints during session. Pt to recliner after session with all needs met.    [facility]

## 2023-08-24 NOTE — TOC Initial Note (Signed)
 Transition of Care [facility]) - Initial/Assessment Note    Patient Details  Name: [name] MRN: [id number] Date of Birth: May 13, 1950  [facility] CM/SW Contact:    Sonda Manuella Quill, RN Phone Number: 08/24/2023, 12:52 PM  Clinical Narrative:                 [place] w/ pt in room; pt says she shares a home w/ her son [name] [phone]); she is plans to return at d/c; pt says her son will provide transportation; pt verified insurance/PCP; she denied SDOH risks; pt says she has cane, walker, and wheelchair; she does not have HH services, or home oxygen ; TOC is following.  Expected Discharge Plan: Home/Self Care Barriers to Discharge: Continued Medical Work up   Patient Goals and CMS Choice Patient states their goals for this hospitalization and ongoing recovery are:: home CMS Medicare.gov Compare Post Acute Care list provided to:: Patient   [place] ownership interest in [facility].provided to:: Patient    Expected Discharge Plan and Services   Discharge Planning Services: CM Consult   Living arrangements for the past 2 months: Single Family Home                                      Prior Living Arrangements/Services Living arrangements for the past 2 months: Single Family Home Lives with:: Adult Children Patient language and need for interpreter reviewed:: Yes Do you feel safe going back to the place where you live?: Yes      Need for Family Participation in Patient Care: Yes (Comment) Care giver support system in place?: Yes (comment) Current home services: DME (cane, crutches, walker, wheelchair) Criminal Activity/Legal Involvement Pertinent to Current Situation/Hospitalization: No - Comment as needed  Activities of Daily Living      Permission Sought/Granted Permission sought to share information with : Case Manager Permission granted to share information with : Yes, Verbal Permission Granted  Share Information with  NAME: Case Manager     Permission granted to share info w Relationship: Vivica Dobosz (son) [phone]     Emotional Assessment Appearance:: Appears stated age Attitude/Demeanor/Rapport: Gracious Affect (typically observed): Accepting Orientation: : Oriented to Self, Oriented to Place, Oriented to  Time, Oriented to Situation Alcohol  / Substance Use: Not Applicable Psych Involvement: No (comment)  Admission diagnosis:  DKA (diabetic ketoacidosis) (HCC) [E11.10] AKI (acute kidney injury) (HCC) [N17.9] Diabetic ketoacidosis without coma associated with type 2 diabetes mellitus (HCC) [E11.10] Patient Active Problem List   Diagnosis Date Noted   Peripheral vascular disorder due to diabetes mellitus (HCC) 08/22/2023   Venous insufficiency (chronic) (peripheral) 10/31/2021   Lower limb pain, inferior, left 10/31/2021   Edema of both lower extremities due to peripheral venous insufficiency 10/31/2021   DKA (diabetic ketoacidosis) (HCC) 08/05/2021   Acute metabolic encephalopathy 08/04/2021   Acute renal failure superimposed on stage 3a chronic kidney disease (HCC) 08/04/2021   Idiopathic sleep related nonobstructive alveolar hypoventilation 10/10/2020   Noncompliance with treatment 10/10/2020   Cyst of right breast 09/20/2020   Acute non-ST segment elevation myocardial infarction (HCC) 09/19/2020   Factor V Leiden mutation (HCC) 09/19/2020   GERD without esophagitis 09/19/2020   Sleep apnea 09/19/2020   Kidney disease 09/19/2020   Activated protein C resistance (HCC) 03/24/2020   Coronary artery disease 03/24/2020   Diabetic nephropathy (HCC) 03/24/2020   Hyperlipidemia 03/24/2020   Long term (current)  use of insulin  (HCC) 03/24/2020   Old myocardial infarction 03/24/2020   Chest pain 01/06/2018   Chronic kidney disease, stage 3a (HCC) 01/06/2018   History of DVT (deep vein thrombosis) 07/18/2017   Right leg pain 07/18/2017   Swelling of both lower extremities 07/18/2017    Insulin -requiring or dependent type II diabetes mellitus (HCC) 07/01/2016   Hx of non-ST elevation myocardial infarction (NSTEMI) 07/01/2016   Screening for lipid disorders 07/01/2016   Type 2 diabetes mellitus with ketoacidosis without coma, with long-term current use of insulin  (HCC) 06/22/2015   COUGH 11/27/2007   PHLEBITIS, SUPERFICIAL VEINS, UPPER LIMB 09/29/2006   Venous (peripheral) insufficiency 07/30/2006   Hypothyroidism 04/17/2006   Essential hypertension 04/17/2006   DEEP VEIN THROMBOPHLEBITIS, LEG 04/17/2006   PCP:  Rik Glinda DASEN, FNP Pharmacy:   [facility] [phone] [address], [place] - [address] AT [facility] [address] [place] [address] Phone: [phone] Fax: [phone]     Social Drivers of Health (SDOH) Social History: SDOH Screenings   Food Insecurity: No Food Insecurity (08/24/2023)  Housing: Low Risk  (08/24/2023)  Transportation Needs: No Transportation Needs (08/24/2023)  Utilities: Not At Risk (08/24/2023)  Social Connections: Patient Unable To Answer (08/23/2023)  Tobacco Use: Low Risk  (08/22/2023)   SDOH Interventions: Food Insecurity Interventions: Intervention Not Indicated, Inpatient TOC Housing Interventions: Intervention Not Indicated, Inpatient TOC Transportation Interventions: Intervention Not Indicated, Inpatient TOC Utilities Interventions: Intervention Not Indicated, Inpatient TOC   Readmission Risk Interventions     No data to display

## 2023-08-25 DIAGNOSIS — E039 Hypothyroidism, unspecified: Secondary | ICD-10-CM | POA: Diagnosis not present

## 2023-08-25 DIAGNOSIS — E785 Hyperlipidemia, unspecified: Secondary | ICD-10-CM | POA: Diagnosis not present

## 2023-08-25 DIAGNOSIS — E1110 Type 2 diabetes mellitus with ketoacidosis without coma: Secondary | ICD-10-CM | POA: Diagnosis not present

## 2023-08-25 DIAGNOSIS — N179 Acute kidney failure, unspecified: Secondary | ICD-10-CM | POA: Diagnosis not present

## 2023-08-25 LAB — BASIC METABOLIC PANEL WITH GFR
Anion gap: 10 (ref 5–15)
BUN: 18 mg/dL (ref 8–23)
CO2: 20 mmol/L — ABNORMAL LOW (ref 22–32)
Calcium: 8.8 mg/dL — ABNORMAL LOW (ref 8.9–10.3)
Chloride: 102 mmol/L (ref 98–111)
Creatinine, Ser: 1.16 mg/dL — ABNORMAL HIGH (ref 0.44–1.00)
GFR, Estimated: 50 mL/min — ABNORMAL LOW (ref >60)
Glucose, Bld: 286 mg/dL — ABNORMAL HIGH (ref 70–99)
Potassium: 3.6 mmol/L (ref 3.5–5.1)
Sodium: 132 mmol/L — ABNORMAL LOW (ref 135–145)

## 2023-08-25 LAB — URINE CULTURE: Culture: <10000 — AB

## 2023-08-25 LAB — GLUCOSE, CAPILLARY
Glucose-Capillary: 275 mg/dL — ABNORMAL HIGH (ref 70–99)
Glucose-Capillary: 244 mg/dL — ABNORMAL HIGH (ref 70–99)
Glucose-Capillary: 220 mg/dL — ABNORMAL HIGH (ref 70–99)
Glucose-Capillary: 108 mg/dL — ABNORMAL HIGH (ref 70–99)

## 2023-08-25 MED ORDER — SODIUM CHLORIDE 0.9 % IV BOLUS
250.0000 mL | Freq: Once | INTRAVENOUS | Status: AC
  Administered 2023-08-25: 250 mL via INTRAVENOUS

## 2023-08-25 MED ORDER — FLUCONAZOLE 150 MG PO TABS
150.0000 mg | ORAL_TABLET | Freq: Once | ORAL | Status: AC
  Administered 2023-08-25: 150 mg via ORAL
  Filled 2023-08-25: qty 1

## 2023-08-25 MED ORDER — INSULIN ASPART 100 UNIT/ML IJ SOLN
7.0000 [IU] | Freq: Three times a day (TID) | INTRAMUSCULAR | Status: DC
  Administered 2023-08-25 – 2023-08-26 (×3): 7 [IU] via SUBCUTANEOUS

## 2023-08-25 MED ORDER — SODIUM CHLORIDE 0.9 % IV SOLN
1.0000 g | INTRAVENOUS | Status: DC
  Administered 2023-08-25: 1 g via INTRAVENOUS
  Filled 2023-08-25 (×2): qty 10

## 2023-08-25 NOTE — Progress Notes (Signed)
 PROGRESS NOTE    [name]  FMW:[id number] DOB: 07-17-1950 DOA: 08/22/2023 PCP: Rik Glinda DASEN, FNP   Brief Narrative: [name] is a 73 y.o. female with a history of diabetes mellitus type 2, hypertension, hypothyroidism, hyperlipidemia, OSA, CKD.  Patient presented secondary to weakness and found to have evidence of hyperglycemia and DKA. IV insulin  and IV fluids started. Patient transitioned to subcutaneous insulin .   Assessment and Plan:  DKA It appears this is secondary to medication nonadherence. Patient has not been taking her insulin  glargine. Patient found to have evidence of acidosis with associated hyperglycemia. IV insulin  started for treatment and patient transitioned to subcutaneous insulin . -Increase to Semglee  20 units -Continue SSI -Add Novolog  7 units TID with meals -Carb modified diet  AKI on CKD stage IIIa Baseline creatinine appears to be around 1.2. Creatinine of 2.33 on admission. Improving with IV fluids. -BMP in AM -IV fluid bolus  Possible UTI Patient with LUTS. -Follow-up urine culture -Ceftriaxone  IV  Primary hypertension Patient is on amlodipine  and losartan  as an outpatient. Losartan  held secondary to AKI. Blood pressure not controlled. -Continue amlodipine  -Resume losartan   Hypothyroidism -Continue Synthroid   GERD without esophagitis -Continue Protonix   Venous insufficiency Noted.  Obstructive sleep apnea -CPAP qhs  Hyperlipidemia Patient is on Vytorin  as an outpatient. Continued on admission.  History of DVT No longer on anticoagulation.  History of NSTEMI Noted. EKG with non-specific anterolateral t-wave changes. No chest pain.   DVT prophylaxis: Heparin  Code Status:   Code Status: Full Code Family Communication: None at bedside Disposition Plan: Discharge home likely in 1-2 days pending stable blood sugar on subcutaneous insulin    Consultants:  None  Procedures:  None  Antimicrobials: None     Subjective: Ongoing vaginal discomfort and pelvic pressure. Concerned for a UTI.  Objective: BP (!) 145/58   Pulse 78   Temp 99 F (37.2 C) (Oral)   Resp 18   Ht 5' 5 (1.651 m)   Wt 91.5 kg   SpO2 100%   BMI 33.57 kg/m   Examination:  General exam: Appears calm and comfortable Respiratory system: Clear to auscultation. Respiratory effort normal. Cardiovascular system: S1 & S2 heard, RRR. Gastrointestinal system: Abdomen is nondistended, soft and nontender. Normal bowel sounds heard. Central nervous system: Alert and oriented. No focal neurological deficits. Psychiatry: Judgement and insight appear normal. Mood & affect appropriate.    Data Reviewed: I have personally reviewed following labs and imaging studies  CBC Lab Results  Component Value Date   WBC 12.2 (H) 08/22/2023   RBC 4.72 08/22/2023   HGB 13.3 08/22/2023   HCT 40.5 08/22/2023   MCV 85.8 08/22/2023   MCH 28.2 08/22/2023   PLT 363 08/22/2023   MCHC 32.8 08/22/2023   RDW 14.6 08/22/2023   LYMPHSABS 1.3 08/22/2023   MONOABS 0.8 08/22/2023   EOSABS 0.0 08/22/2023   BASOSABS 0.0 08/22/2023     Last metabolic panel Lab Results  Component Value Date   NA 132 (L) 08/25/2023   K 3.6 08/25/2023   CL 102 08/25/2023   CO2 20 (L) 08/25/2023   BUN 18 08/25/2023   CREATININE 1.16 (H) 08/25/2023   GLUCOSE 286 (H) 08/25/2023   GFRNONAA 50 (L) 08/25/2023   GFRAA >60 01/07/2018   CALCIUM 8.8 (L) 08/25/2023   PHOS 3.7 02/07/2020   PROT 6.9 08/05/2021   ALBUMIN 2.9 (L) 08/05/2021   BILITOT 0.9 08/05/2021   ALKPHOS 66 08/05/2021   AST 34 08/05/2021   ALT  14 08/05/2021   ANIONGAP 10 08/25/2023    GFR: Estimated Creatinine Clearance: 49 mL/min (A) (by C-G formula based on SCr of 1.16 mg/dL (H)).  Recent Results (from the past 240 hours)  MRSA Next Gen by PCR, Nasal     Status: None   Collection Time: 08/22/23  9:50 PM   Specimen: Nasal Mucosa; Nasal Swab  Result Value Ref Range Status   MRSA by  PCR Next Gen NOT DETECTED NOT DETECTED Final    Comment: (NOTE) The GeneXpert MRSA Assay (FDA approved for NASAL specimens only), is one component of a comprehensive MRSA colonization surveillance program. It is not intended to diagnose MRSA infection nor to guide or monitor treatment for MRSA infections. Test performance is not FDA approved in patients less than 68 years old. Performed at [facility], [address], [place], [place] [address]       Radiology Studies: No results found.    LOS: 3 days    Elgin Lam, MD [facility] 08/25/2023, 12:01 PM   If 7PM-7AM, please contact night-coverage [email or web address]

## 2023-08-25 NOTE — Progress Notes (Signed)
 Mobility Specialist - Progress Note   08/25/23 1209  Mobility  Activity Ambulated with assistance in hallway  Level of Assistance Standby assist, set-up cues, supervision of patient - no hands on  Assistive Device Front wheel walker  Distance Ambulated (ft) 250 ft  Activity Response Tolerated well  Mobility Referral Yes  Mobility visit 1 Mobility  Mobility Specialist Start Time (ACUTE ONLY) 1155  Mobility Specialist Stop Time (ACUTE ONLY) 1209  Mobility Specialist Time Calculation (min) (ACUTE ONLY) 14 min   Pt received in bed and agreeable to mobility. No complaints during session. Pt to bed after session with all needs met.    [facility]

## 2023-08-26 ENCOUNTER — Other Ambulatory Visit (HOSPITAL_COMMUNITY): Payer: Self-pay

## 2023-08-26 DIAGNOSIS — E1110 Type 2 diabetes mellitus with ketoacidosis without coma: Secondary | ICD-10-CM | POA: Diagnosis not present

## 2023-08-26 LAB — BASIC METABOLIC PANEL WITH GFR
Anion gap: 6 (ref 5–15)
BUN: 14 mg/dL (ref 8–23)
CO2: 23 mmol/L (ref 22–32)
Calcium: 8.6 mg/dL — ABNORMAL LOW (ref 8.9–10.3)
Chloride: 106 mmol/L (ref 98–111)
Creatinine, Ser: 1.1 mg/dL — ABNORMAL HIGH (ref 0.44–1.00)
GFR, Estimated: 53 mL/min — ABNORMAL LOW (ref >60)
Glucose, Bld: 269 mg/dL — ABNORMAL HIGH (ref 70–99)
Potassium: 4 mmol/L (ref 3.5–5.1)
Sodium: 135 mmol/L (ref 135–145)

## 2023-08-26 LAB — GLUCOSE, CAPILLARY
Glucose-Capillary: 243 mg/dL — ABNORMAL HIGH (ref 70–99)
Glucose-Capillary: 323 mg/dL — ABNORMAL HIGH (ref 70–99)
Glucose-Capillary: 245 mg/dL — ABNORMAL HIGH (ref 70–99)

## 2023-08-26 MED ORDER — CEPHALEXIN 500 MG PO CAPS
500.0000 mg | ORAL_CAPSULE | Freq: Two times a day (BID) | ORAL | 0 refills | Status: AC
  Filled 2023-08-26: qty 6, 3d supply, fill #0

## 2023-08-26 MED ORDER — EZETIMIBE-SIMVASTATIN 10-40 MG PO TABS
1.0000 | ORAL_TABLET | Freq: Every day | ORAL | 0 refills | Status: AC
  Filled 2023-08-26: qty 90, 90d supply, fill #0

## 2023-08-26 MED ORDER — LANTUS SOLOSTAR 100 UNIT/ML ~~LOC~~ SOPN
30.0000 [IU] | PEN_INJECTOR | Freq: Every morning | SUBCUTANEOUS | 0 refills | Status: AC
  Filled 2023-08-26: qty 15, 50d supply, fill #0

## 2023-08-26 MED ORDER — SODIUM CHLORIDE 0.9 % IV SOLN
1.0000 g | Freq: Once | INTRAVENOUS | Status: AC
  Administered 2023-08-26: 1 g via INTRAVENOUS
  Filled 2023-08-26: qty 10

## 2023-08-26 MED ORDER — AMLODIPINE BESYLATE 5 MG PO TABS
5.0000 mg | ORAL_TABLET | Freq: Every day | ORAL | 0 refills | Status: AC
  Filled 2023-08-26: qty 90, 90d supply, fill #0

## 2023-08-26 MED ORDER — METOPROLOL SUCCINATE ER 25 MG PO TB24: 25.0000 mg | ORAL_TABLET | Freq: Every day | ORAL | Status: AC

## 2023-08-26 MED ORDER — PANTOPRAZOLE SODIUM 40 MG PO TBEC
40.0000 mg | DELAYED_RELEASE_TABLET | Freq: Every day | ORAL | 0 refills | Status: AC
  Filled 2023-08-26: qty 30, 30d supply, fill #0

## 2023-08-26 MED ORDER — INSULIN LISPRO (1 UNIT DIAL) 100 UNIT/ML (KWIKPEN): 7.0000 [IU] | PEN_INJECTOR | Freq: Three times a day (TID) | SUBCUTANEOUS | Status: AC

## 2023-08-26 MED ORDER — ASPIRIN 81 MG PO CHEW: 81.0000 mg | CHEWABLE_TABLET | Freq: Every day | ORAL | Status: AC

## 2023-08-26 MED ORDER — INSULIN GLARGINE-YFGN 100 UNIT/ML ~~LOC~~ SOLN
30.0000 [IU] | Freq: Every day | SUBCUTANEOUS | Status: DC
  Administered 2023-08-26: 30 [IU] via SUBCUTANEOUS
  Filled 2023-08-26: qty 0.3

## 2023-08-26 NOTE — Inpatient Diabetes Management (Signed)
 Inpatient Diabetes Program Recommendations  AACE/ADA: New Consensus Statement on Inpatient Glycemic Control (2015)  Target Ranges:  Prepandial:   less than 140 mg/dL      Peak postprandial:   less than 180 mg/dL (1-2 hours)      Critically ill patients:  140 - 180 mg/dL   Lab Results  Component Value Date   GLUCAP 323 (H) 08/26/2023   HGBA1C 13.3 (H) 08/23/2023    Review of Glycemic Control  Diabetes history: DM2 Outpatient Diabetes medications: Lantus  20 daily (not taking), Humalog  14 units TID Current orders for Inpatient glycemic control: Semglee  30 daily, Novolog  0-15 TID + 7 units TID  HgbA1C - 13.3%  Inpatient Diabetes Program Recommendations:    Agree with orders.  Spoke with pt yesterday afternoon regarding her diabetes and HgbA1C of 13.3%. 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 States she had her Lantus  at home, but just didn't take it. Was still taking her Humalog , not checking blood sugars. Has not followed up with PCP recently. Pt wanting to get back on track with taking care of herself and eating right. Discussed A1C results (13.3% ) and explained that current A1C indicates an average glucose of  390 mg/dl over the past 2-3 months. Discussed glucose and A1C goals. Discussed importance of checking CBGs and maintaining good CBG control to prevent long-term and short-term complications. Explained how hyperglycemia leads to damage within blood vessels which lead to the common complications seen with uncontrolled diabetes. Stressed to the patient the importance of improving glycemic control to prevent further complications from uncontrolled diabetes. Discussed impact of nutrition, exercise, stress, sickness, and medications on diabetes control.  Discussed carbohydrates and portion control. Pt states she knows what to do, just needs  to make taking care of herself important. States this has been a wake-up call to her. Answered all questions/concerns.  Will follow.  Thank you. Shona Brandy, RD, LDN, CDCES Inpatient Diabetes Coordinator [phone]

## 2023-08-26 NOTE — Discharge Summary (Signed)
 Physician Discharge Summary   Patient: [name] MRN: [id number] DOB: 12/16/1950  Admit date:     08/22/2023  Discharge date: 08/26/23  Discharge Physician: Elgin Lam, MD   PCP: Rik Glinda DASEN, FNP   Recommendations at discharge:  PCP visit for hospital follow-up  Discharge Diagnoses: Principal Problem:   DKA (diabetic ketoacidosis) (HCC) Active Problems:   Acute renal failure superimposed on stage 3a chronic kidney disease (HCC)   Essential hypertension   Hypothyroidism   GERD without esophagitis   Insulin -requiring or dependent type II diabetes mellitus (HCC)   Hx of non-ST elevation myocardial infarction (NSTEMI)   History of DVT (deep vein thrombosis)   Diabetic nephropathy (HCC)   Hyperlipidemia   Factor V Leiden mutation (HCC)   Sleep apnea   Venous insufficiency (chronic) (peripheral)  Resolved Problems:   * No resolved hospital problems. *  Hospital Course: GLENNIS BORGER is a 73 y.o. female with a history of diabetes mellitus type 2, hypertension, hypothyroidism, hyperlipidemia, OSA, CKD.  Patient presented secondary to weakness and found to have evidence of hyperglycemia and DKA. IV insulin  and IV fluids started. Patient transitioned to subcutaneous insulin .  Assessment and Plan:  DKA It appears this is secondary to medication nonadherence. Patient has not been taking her insulin  glargine. Patient found to have evidence of acidosis with associated hyperglycemia. IV insulin  started for treatment and patient transitioned to subcutaneous insulin . Increased to insulin  glargine 30 units at bedtime and added Novolog  7 units TID with meals. Patient to continue this regimen on discharge.   AKI on CKD stage IIIa Baseline creatinine appears to be around 1.2. Creatinine of 2.33 on admission. Improved with IV fluids.   Possible UTI Patient with LUTS. Urine culture obtained and patient started on empiric Ceftriaxone  IV. Urine culture with insignificant grow. Patient  discharged on Keflex .   Primary hypertension Patient is on amlodipine  and losartan  as an outpatient. Losartan  initially held secondary to AKI. Blood pressure not controlled. Resume home regimen.   Hypothyroidism Continue Synthroid    GERD without esophagitis Continue Protonix    Venous insufficiency Noted.   Obstructive sleep apnea CPAP qhs   Hyperlipidemia Patient is on Vytorin  as an outpatient. Continued on admission.   History of DVT No longer on anticoagulation.   History of NSTEMI Noted. EKG with non-specific anterolateral t-wave changes. No chest pain.   Consultants: None Procedures performed: None  Disposition: Home Diet recommendation: Cardiac and Carb modified diet   DISCHARGE MEDICATION: Allergies as of 08/26/2023   No Known Allergies      Medication List     STOP taking these medications    benzonatate  200 MG capsule Commonly known as: TESSALON    cyclobenzaprine  10 MG tablet Commonly known as: FLEXERIL    Mouthwash Compounding Base Liqd   nystatin  100000 UNIT/ML suspension Commonly known as: MYCOSTATIN    ondansetron  8 MG disintegrating tablet Commonly known as: ZOFRAN -ODT   sucralfate  1 GM/10ML suspension Commonly known as: Carafate        TAKE these medications    acetaminophen  500 MG tablet Commonly known as: TYLENOL  Take 500-1,000 mg by mouth every 6 (six) hours as needed for mild pain or headache.   albuterol  108 (90 Base) MCG/ACT inhaler Commonly known as: VENTOLIN  HFA Inhale 2 puffs into the lungs every 6 (six) hours as needed for wheezing or shortness of breath.   amLODipine  5 MG tablet Commonly known as: NORVASC  Take 1 tablet (5 mg total) by mouth daily.   aspirin  81 MG chewable tablet Commonly  known as: Aspirin  81 Chew 1 tablet (81 mg total) by mouth daily. What changed: medication strength   cephALEXin  500 MG capsule Commonly known as: KEFLEX  Take 1 capsule (500 mg total) by mouth 2 (two) times daily for 3  days. Start taking on: August 27, 2023   ezetimibe -simvastatin  10-40 MG tablet Commonly known as: VYTORIN  Take 1 tablet by mouth daily. PATIENT MUST SCHEDULE APPOINTMENT FOR FUTURE REFILLS. FIRST ATTEMPT   ferrous sulfate  325 (65 FE) MG tablet Take 1 tablet (325 mg total) by mouth daily with breakfast.   fluticasone  50 MCG/ACT nasal spray Commonly known as: FLONASE  Place 1 spray into both nostrils daily. What changed:  when to take this reasons to take this   insulin  lispro 100 UNIT/ML KwikPen Commonly known as: HumaLOG  KwikPen Inject 7 Units into the skin 3 (three) times daily before meals. What changed:  how much to take when to take this reasons to take this   Lantus  SoloStar 100 UNIT/ML Solostar Pen Generic drug: insulin  glargine Inject 30 Units into the skin in the morning. Start taking on: August 27, 2023 What changed: how much to take   levothyroxine  200 MCG tablet Commonly known as: SYNTHROID  Take 200 mcg by mouth daily before breakfast.   losartan  100 MG tablet Commonly known as: COZAAR  Take 1 tablet (100 mg total) by mouth daily. What changed: how much to take   metoprolol  succinate 25 MG 24 hr tablet Commonly known as: TOPROL -XL Take 1 tablet (25 mg total) by mouth daily.   OneTouch Ultra test strip Generic drug: glucose blood   OneTouch Verio w/Device Kit See admin instructions.   pantoprazole  40 MG tablet Commonly known as: PROTONIX  Take 1 tablet (40 mg total) by mouth daily at 6 (six) AM.   polyethylene glycol 17 g packet Commonly known as: MIRALAX  / GLYCOLAX  Take 17 g by mouth daily as needed for mild constipation.   Restasis 0.05 % ophthalmic emulsion Generic drug: cycloSPORINE Place 1 drop into both eyes 2 (two) times daily as needed (for dry eyes).        Follow-up Information     Rik Glinda DASEN, FNP. Schedule an appointment as soon as possible for a visit in 1 week(s).   Specialty: Family Medicine Why: For hospital follow-up Contact  information: [address] [place] [address] [phone]                Discharge Exam: BP (!) 157/89   Pulse 91   Temp 98.7 F (37.1 C) (Oral)   Resp 20   Ht 5' 5 (1.651 m)   Wt 91.5 kg   SpO2 100%   BMI 33.57 kg/m   General exam: Appears calm and comfortable Respiratory system: Clear to auscultation. Respiratory effort normal. Cardiovascular system: S1 & S2 heard, RRR. No murmurs, rubs, gallops or clicks. Gastrointestinal system: Abdomen is nondistended, soft and nontender. Normal bowel sounds heard. Central nervous system: Alert and oriented. No focal neurological deficits. Musculoskeletal: No edema. No calf tenderness Skin: No cyanosis. No rashes Psychiatry: Judgement and insight appear normal. Mood & affect appropriate.   Condition at discharge: stable  The results of significant diagnostics from this hospitalization (including imaging, microbiology, ancillary and laboratory) are listed below for reference.   Imaging Studies: No results found.  Microbiology: Results for orders placed or performed during the hospital encounter of 08/22/23  MRSA Next Gen by PCR, Nasal     Status: None   Collection Time: 08/22/23  9:50 PM  Specimen: Nasal Mucosa; Nasal Swab  Result Value Ref Range Status   MRSA by PCR Next Gen NOT DETECTED NOT DETECTED Final    Comment: (NOTE) The GeneXpert MRSA Assay (FDA approved for NASAL specimens only), is one component of a comprehensive MRSA colonization surveillance program. It is not intended to diagnose MRSA infection nor to guide or monitor treatment for MRSA infections. Test performance is not FDA approved in patients less than 36 years old. Performed at [facility], [address], [place], [place] [address]   Urine Culture     Status: Abnormal   Collection Time: 08/24/23  5:35 PM   Specimen: Urine, Random  Result Value Ref Range Status   Specimen Description   Final    URINE,  RANDOM Performed at [facility], [address], [place], [place] [address]    Special Requests   Final    NONE Reflexed from [id number] Performed at [facility], [address], [place], [place] [address]    Culture (A)  Final    <10,000 COLONIES/mL INSIGNIFICANT GROWTH Performed at [facility], [address], [place], [place] [address]    Report Status 08/25/2023 FINAL  Final    Labs: CBC: Recent Labs  Lab 08/22/23 1835  WBC 12.2*  NEUTROABS 10.0*  HGB 13.3  HCT 40.5  MCV 85.8  PLT 363   Basic Metabolic Panel: Recent Labs  Lab 08/23/23 0831 08/23/23 1227 08/24/23 0257 08/25/23 0833 08/26/23 0505  NA 139 137 136 132* 135  K 3.9 3.9 3.9 3.6 4.0  CL 102 104 103 102 106  CO2 21* 22 19* 20* 23  GLUCOSE 75 128* 236* 286* 269*  BUN 35* 31* 22 18 14   CREATININE 1.83* 1.65* 1.38* 1.16* 1.10*  CALCIUM 10.0 9.5 9.0 8.8* 8.6*    CBG: Recent Labs  Lab 08/25/23 1116 08/25/23 1605 08/25/23 2104 08/26/23 0215 08/26/23 0755  GLUCAP 244* 220* 108* 243* 323*    Discharge time spent: 35 minutes.  Signed: Elgin Lam, MD [facility] 08/26/2023

## 2023-08-26 NOTE — Progress Notes (Signed)
   08/26/23 0122  BiPAP/CPAP/SIPAP  BiPAP/CPAP/SIPAP Pt Type Adult  Reason BIPAP/CPAP not in use Non-compliant

## 2023-08-26 NOTE — TOC Transition Note (Signed)
 Transition of Care [facility]) - Discharge Note   Patient Details  Name: [name] MRN: [id number] Date of Birth: Nov 13, 1950  Transition of Care [facility]) CM/SW Contact:  Bascom Service, RN Phone Number: 08/26/2023, 11:29 AM   Clinical Narrative:  d/c home no CM needs.     Final next level of care: Home/Self Care Barriers to Discharge: No Barriers Identified   Patient Goals and CMS Choice Patient states their goals for this hospitalization and ongoing recovery are:: home CMS Medicare.gov Compare Post Acute Care list provided to:: Patient    ownership interest in [facility].provided to:: Patient    Discharge Placement                       Discharge Plan and Services Additional resources added to the After Visit Summary for     Discharge Planning Services: CM Consult                                 Social Drivers of Health (SDOH) Interventions SDOH Screenings   Food Insecurity: No Food Insecurity (08/24/2023)  Housing: Low Risk  (08/24/2023)  Transportation Needs: No Transportation Needs (08/24/2023)  Utilities: Not At Risk (08/24/2023)  Social Connections: Patient Unable To Answer (08/23/2023)  Tobacco Use: Low Risk  (08/22/2023)     Readmission Risk Interventions     No data to display

## 2023-08-26 NOTE — Discharge Instructions (Signed)
 [name],  You were in the hospital with high blood sugar and high acid levels in your blood (DKA). You were managed with IV insulin  and have improved. Please take insulin  as prescribed and check your blood sugar three times a day before breakfast, lunch and dinner. Please follow-up with your primary care physician, as well.

## 2023-08-26 NOTE — Care Management Important Message (Signed)
 Important Message  Patient Details IM Letter given. Name: [name] MRN: [id number] Date of Birth: Jan 28, 1951   Important Message Given:  Yes - Medicare IM     Melba Ates 08/26/2023, 12:17 PM

## 2023-08-27 ENCOUNTER — Ambulatory Visit (INDEPENDENT_AMBULATORY_CARE_PROVIDER_SITE_OTHER): Payer: Medicare Other | Admitting: Podiatry

## 2023-08-27 ENCOUNTER — Other Ambulatory Visit (HOSPITAL_COMMUNITY): Payer: Self-pay

## 2023-08-27 DIAGNOSIS — Z91198 Patient's noncompliance with other medical treatment and regimen for other reason: Secondary | ICD-10-CM

## 2023-08-28 ENCOUNTER — Other Ambulatory Visit (HOSPITAL_COMMUNITY): Payer: Self-pay

## 2023-08-29 NOTE — Progress Notes (Signed)
 1. Failure to attend appointment with reason given    Rescheduled appt by patient.

## 2023-09-02 ENCOUNTER — Other Ambulatory Visit (HOSPITAL_COMMUNITY): Payer: Self-pay

## 2023-11-03 ENCOUNTER — Ambulatory Visit: Admitting: Internal Medicine

## 2023-11-19 ENCOUNTER — Ambulatory Visit: Admitting: Podiatry

## 2023-11-19 ENCOUNTER — Encounter: Payer: Self-pay | Admitting: Podiatry

## 2023-11-19 DIAGNOSIS — M79675 Pain in left toe(s): Secondary | ICD-10-CM

## 2023-11-19 DIAGNOSIS — B351 Tinea unguium: Secondary | ICD-10-CM

## 2023-11-19 DIAGNOSIS — Z794 Long term (current) use of insulin: Secondary | ICD-10-CM

## 2023-11-19 DIAGNOSIS — M79674 Pain in right toe(s): Secondary | ICD-10-CM

## 2023-11-19 DIAGNOSIS — E119 Type 2 diabetes mellitus without complications: Secondary | ICD-10-CM

## 2023-11-23 NOTE — Progress Notes (Signed)
  Subjective:  Patient ID: [name], female    DOB: 1951-01-01,  MRN: [id number]  [name] presents to clinic today for preventative diabetic foot care for painful elongated mycotic toenails 1-5 bilaterally which are tender when wearing enclosed shoe gear. Pain is relieved with periodic professional debridement.  Chief Complaint  Patient presents with   Diabetes    DFC IDDM A1C 12.0 Toenail trim. LOV with PCP 10/2023.   New problem(s): None.   PCP is Rik Glinda DASEN, FNP.  No Known Allergies  Review of Systems: Negative except as noted in the HPI.  Objective: No changes noted in today's physical examination. There were no vitals filed for this visit. [name] is a pleasant 73 y.o. female WD, WN in NAD. AAO x 3.  Vascular Examination: Vascular status intact b/l with palpable pedal pulses. CFT immediate b/l. Pedal hair present. No edema. No pain with calf compression b/l. Skin temperature gradient WNL b/l. No varicosities noted. No cyanosis or clubbing noted.  Neurological Examination: Sensation grossly intact b/l with 10 gram monofilament. Vibratory sensation intact b/l.  Dermatological Examination: Pedal skin with normal turgor, texture and tone b/l. No open wounds nor interdigital macerations noted. Toenails 1-5 b/l thick, discolored, elongated with subungual debris and pain on dorsal palpation. No hyperkeratotic lesions noted b/l.   Musculoskeletal Examination: Muscle strength 5/5 to b/l LE.  No pain, crepitus noted b/l. No gross pedal deformities. Patient ambulates independently without assistive aids.   Radiographs: None  Assessment/Plan: 1. Pain due to onychomycosis of toenails of both feet   2. Type 2 diabetes mellitus without complication, with long-term current use of insulin  [facility])   Consent given for treatment. Patient examined. All patient's and/or POA's questions/concerns addressed on today's visit. Toenails 1-5 debrided in length and girth  without incident. Continue foot and shoe inspections daily. Monitor blood glucose per PCP/Endocrinologist's recommendations. Continue soft, supportive shoe gear daily. Report any pedal injuries to medical professional. Call office if there are any questions/concerns. -Patient/POA to call should there be question/concern in the interim.   Return in about 3 months (around 02/19/2024).  Delon LITTIE Merlin, DPM      [place] LOCATION: [address], [place] [address]                   Office [phone]   [facility] LOCATION: [address] [place], [place] [address] Office [phone]

## 2023-12-29 ENCOUNTER — Ambulatory Visit: Attending: Internal Medicine | Admitting: Internal Medicine

## 2023-12-29 ENCOUNTER — Encounter: Payer: Self-pay | Admitting: Internal Medicine

## 2024-02-15 ENCOUNTER — Other Ambulatory Visit: Payer: Self-pay

## 2024-02-15 ENCOUNTER — Observation Stay (HOSPITAL_COMMUNITY)
Admission: EM | Admit: 2024-02-15 | Discharge: 2024-02-19 | Disposition: A | Source: Home / Self Care | Attending: Emergency Medicine | Admitting: Emergency Medicine

## 2024-02-15 ENCOUNTER — Encounter (HOSPITAL_COMMUNITY): Payer: Self-pay

## 2024-02-15 DIAGNOSIS — R059 Cough, unspecified: Secondary | ICD-10-CM | POA: Diagnosis present

## 2024-02-15 DIAGNOSIS — Z79899 Other long term (current) drug therapy: Secondary | ICD-10-CM | POA: Insufficient documentation

## 2024-02-15 DIAGNOSIS — R0600 Dyspnea, unspecified: Secondary | ICD-10-CM | POA: Insufficient documentation

## 2024-02-15 DIAGNOSIS — N179 Acute kidney failure, unspecified: Secondary | ICD-10-CM | POA: Diagnosis not present

## 2024-02-15 DIAGNOSIS — J101 Influenza due to other identified influenza virus with other respiratory manifestations: Secondary | ICD-10-CM | POA: Insufficient documentation

## 2024-02-15 DIAGNOSIS — J111 Influenza due to unidentified influenza virus with other respiratory manifestations: Secondary | ICD-10-CM

## 2024-02-15 DIAGNOSIS — I2510 Atherosclerotic heart disease of native coronary artery without angina pectoris: Secondary | ICD-10-CM | POA: Diagnosis not present

## 2024-02-15 DIAGNOSIS — E785 Hyperlipidemia, unspecified: Secondary | ICD-10-CM | POA: Insufficient documentation

## 2024-02-15 DIAGNOSIS — E1165 Type 2 diabetes mellitus with hyperglycemia: Secondary | ICD-10-CM | POA: Diagnosis not present

## 2024-02-15 DIAGNOSIS — R7989 Other specified abnormal findings of blood chemistry: Secondary | ICD-10-CM | POA: Diagnosis present

## 2024-02-15 DIAGNOSIS — N1831 Chronic kidney disease, stage 3a: Secondary | ICD-10-CM | POA: Diagnosis not present

## 2024-02-15 DIAGNOSIS — E875 Hyperkalemia: Secondary | ICD-10-CM | POA: Diagnosis not present

## 2024-02-15 DIAGNOSIS — E039 Hypothyroidism, unspecified: Secondary | ICD-10-CM | POA: Insufficient documentation

## 2024-02-15 DIAGNOSIS — B348 Other viral infections of unspecified site: Secondary | ICD-10-CM | POA: Insufficient documentation

## 2024-02-15 DIAGNOSIS — K219 Gastro-esophageal reflux disease without esophagitis: Secondary | ICD-10-CM | POA: Insufficient documentation

## 2024-02-15 DIAGNOSIS — J069 Acute upper respiratory infection, unspecified: Secondary | ICD-10-CM | POA: Diagnosis not present

## 2024-02-15 DIAGNOSIS — E1122 Type 2 diabetes mellitus with diabetic chronic kidney disease: Secondary | ICD-10-CM | POA: Insufficient documentation

## 2024-02-15 DIAGNOSIS — D6851 Activated protein C resistance: Secondary | ICD-10-CM | POA: Diagnosis not present

## 2024-02-15 DIAGNOSIS — Z794 Long term (current) use of insulin: Secondary | ICD-10-CM | POA: Diagnosis not present

## 2024-02-15 DIAGNOSIS — Z86718 Personal history of other venous thrombosis and embolism: Secondary | ICD-10-CM | POA: Insufficient documentation

## 2024-02-15 DIAGNOSIS — E871 Hypo-osmolality and hyponatremia: Secondary | ICD-10-CM | POA: Diagnosis not present

## 2024-02-15 DIAGNOSIS — G4734 Idiopathic sleep related nonobstructive alveolar hypoventilation: Secondary | ICD-10-CM | POA: Diagnosis not present

## 2024-02-15 DIAGNOSIS — J45909 Unspecified asthma, uncomplicated: Secondary | ICD-10-CM | POA: Diagnosis not present

## 2024-02-15 DIAGNOSIS — E1110 Type 2 diabetes mellitus with ketoacidosis without coma: Principal | ICD-10-CM | POA: Diagnosis present

## 2024-02-15 DIAGNOSIS — R062 Wheezing: Secondary | ICD-10-CM | POA: Diagnosis not present

## 2024-02-15 DIAGNOSIS — I10 Essential (primary) hypertension: Secondary | ICD-10-CM | POA: Diagnosis present

## 2024-02-15 DIAGNOSIS — E1111 Type 2 diabetes mellitus with ketoacidosis with coma: Secondary | ICD-10-CM | POA: Diagnosis not present

## 2024-02-15 DIAGNOSIS — Z7982 Long term (current) use of aspirin: Secondary | ICD-10-CM | POA: Diagnosis not present

## 2024-02-15 DIAGNOSIS — E1151 Type 2 diabetes mellitus with diabetic peripheral angiopathy without gangrene: Secondary | ICD-10-CM | POA: Diagnosis not present

## 2024-02-15 DIAGNOSIS — I129 Hypertensive chronic kidney disease with stage 1 through stage 4 chronic kidney disease, or unspecified chronic kidney disease: Secondary | ICD-10-CM | POA: Diagnosis not present

## 2024-02-15 LAB — URINALYSIS, ROUTINE W REFLEX MICROSCOPIC
Bacteria, UA: NONE SEEN
Bilirubin Urine: NEGATIVE
Glucose, UA: >=500 mg/dL — AB
Hgb urine dipstick: NEGATIVE
Ketones, ur: 5 mg/dL — AB
Leukocytes,Ua: NEGATIVE
Nitrite: NEGATIVE
Protein, ur: NEGATIVE mg/dL
Specific Gravity, Urine: 1.021 (ref 1.005–1.030)
pH: 5 (ref 5.0–8.0)

## 2024-02-15 LAB — CBC
HCT: 37.2 % (ref 36.0–46.0)
Hemoglobin: 11.6 g/dL — ABNORMAL LOW (ref 12.0–15.0)
MCH: 28.4 pg (ref 26.0–34.0)
MCHC: 31.2 g/dL (ref 30.0–36.0)
MCV: 91 fL (ref 80.0–100.0)
Platelets: 284 K/uL (ref 150–400)
RBC: 4.09 MIL/uL (ref 3.87–5.11)
RDW: 15.4 % (ref 11.5–15.5)
WBC: 4.1 K/uL (ref 4.0–10.5)
nRBC: 0 % (ref 0.0–0.2)

## 2024-02-15 NOTE — ED Triage Notes (Signed)
 Pt ambulatory to triage with complaints of cough, elevated blood sugar, cough, and urinary incontinence. Pt states that sx began on Friday and have worsened.

## 2024-02-15 NOTE — ED Notes (Signed)
Called for vitals no response

## 2024-02-16 ENCOUNTER — Emergency Department (HOSPITAL_COMMUNITY)

## 2024-02-16 DIAGNOSIS — J45909 Unspecified asthma, uncomplicated: Secondary | ICD-10-CM | POA: Diagnosis present

## 2024-02-16 DIAGNOSIS — E875 Hyperkalemia: Secondary | ICD-10-CM | POA: Diagnosis present

## 2024-02-16 DIAGNOSIS — R7989 Other specified abnormal findings of blood chemistry: Secondary | ICD-10-CM | POA: Diagnosis present

## 2024-02-16 DIAGNOSIS — J069 Acute upper respiratory infection, unspecified: Secondary | ICD-10-CM | POA: Diagnosis present

## 2024-02-16 DIAGNOSIS — E1110 Type 2 diabetes mellitus with ketoacidosis without coma: Secondary | ICD-10-CM | POA: Diagnosis not present

## 2024-02-16 LAB — COMPREHENSIVE METABOLIC PANEL WITH GFR
ALT: 17 U/L (ref 0–44)
AST: 29 U/L (ref 15–41)
Albumin: 4 g/dL (ref 3.5–5.0)
Alkaline Phosphatase: 99 U/L (ref 38–126)
Anion gap: 22 — ABNORMAL HIGH (ref 5–15)
BUN: 23 mg/dL (ref 8–23)
CO2: 15 mmol/L — ABNORMAL LOW (ref 22–32)
Calcium: 9.3 mg/dL (ref 8.9–10.3)
Chloride: 89 mmol/L — ABNORMAL LOW (ref 98–111)
Creatinine, Ser: 1.62 mg/dL — ABNORMAL HIGH (ref 0.44–1.00)
GFR, Estimated: 33 mL/min — ABNORMAL LOW
Glucose, Bld: 645 mg/dL (ref 70–99)
Potassium: 6.5 mmol/L (ref 3.5–5.1)
Sodium: 126 mmol/L — ABNORMAL LOW (ref 135–145)
Total Bilirubin: 0.3 mg/dL (ref 0.0–1.2)
Total Protein: 7.6 g/dL (ref 6.5–8.1)

## 2024-02-16 LAB — BASIC METABOLIC PANEL WITH GFR
Anion gap: 19 — ABNORMAL HIGH (ref 5–15)
Anion gap: 14 (ref 5–15)
Anion gap: 15 (ref 5–15)
Anion gap: 12 (ref 5–15)
BUN: 20 mg/dL (ref 8–23)
BUN: 25 mg/dL — ABNORMAL HIGH (ref 8–23)
BUN: 24 mg/dL — ABNORMAL HIGH (ref 8–23)
BUN: 22 mg/dL (ref 8–23)
CO2: 15 mmol/L — ABNORMAL LOW (ref 22–32)
CO2: 20 mmol/L — ABNORMAL LOW (ref 22–32)
CO2: 21 mmol/L — ABNORMAL LOW (ref 22–32)
CO2: 22 mmol/L (ref 22–32)
Calcium: 7.9 mg/dL — ABNORMAL LOW (ref 8.9–10.3)
Calcium: 9.2 mg/dL (ref 8.9–10.3)
Calcium: 9.4 mg/dL (ref 8.9–10.3)
Calcium: 9 mg/dL (ref 8.9–10.3)
Chloride: 100 mmol/L (ref 98–111)
Chloride: 99 mmol/L (ref 98–111)
Chloride: 99 mmol/L (ref 98–111)
Chloride: 101 mmol/L (ref 98–111)
Creatinine, Ser: 1.4 mg/dL — ABNORMAL HIGH (ref 0.44–1.00)
Creatinine, Ser: 1.58 mg/dL — ABNORMAL HIGH (ref 0.44–1.00)
Creatinine, Ser: 1.52 mg/dL — ABNORMAL HIGH (ref 0.44–1.00)
Creatinine, Ser: 1.31 mg/dL — ABNORMAL HIGH (ref 0.44–1.00)
GFR, Estimated: 40 mL/min — ABNORMAL LOW
GFR, Estimated: 34 mL/min — ABNORMAL LOW
GFR, Estimated: 36 mL/min — ABNORMAL LOW
GFR, Estimated: 43 mL/min — ABNORMAL LOW
Glucose, Bld: 402 mg/dL — ABNORMAL HIGH (ref 70–99)
Glucose, Bld: 227 mg/dL — ABNORMAL HIGH (ref 70–99)
Glucose, Bld: 147 mg/dL — ABNORMAL HIGH (ref 70–99)
Glucose, Bld: 185 mg/dL — ABNORMAL HIGH (ref 70–99)
Potassium: 3.6 mmol/L (ref 3.5–5.1)
Potassium: 3.8 mmol/L (ref 3.5–5.1)
Potassium: 4.3 mmol/L (ref 3.5–5.1)
Potassium: 3.8 mmol/L (ref 3.5–5.1)
Sodium: 135 mmol/L (ref 135–145)
Sodium: 133 mmol/L — ABNORMAL LOW (ref 135–145)
Sodium: 136 mmol/L (ref 135–145)
Sodium: 135 mmol/L (ref 135–145)

## 2024-02-16 LAB — CBG MONITORING, ED
Glucose-Capillary: 552 mg/dL (ref 70–99)
Glucose-Capillary: 562 mg/dL (ref 70–99)
Glucose-Capillary: 554 mg/dL (ref 70–99)
Glucose-Capillary: >600 mg/dL (ref 70–99)
Glucose-Capillary: 532 mg/dL (ref 70–99)
Glucose-Capillary: 438 mg/dL — ABNORMAL HIGH (ref 70–99)
Glucose-Capillary: 505 mg/dL (ref 70–99)
Glucose-Capillary: 260 mg/dL — ABNORMAL HIGH (ref 70–99)
Glucose-Capillary: 418 mg/dL — ABNORMAL HIGH (ref 70–99)
Glucose-Capillary: 248 mg/dL — ABNORMAL HIGH (ref 70–99)
Glucose-Capillary: 199 mg/dL — ABNORMAL HIGH (ref 70–99)
Glucose-Capillary: 180 mg/dL — ABNORMAL HIGH (ref 70–99)
Glucose-Capillary: 179 mg/dL — ABNORMAL HIGH (ref 70–99)
Glucose-Capillary: 168 mg/dL — ABNORMAL HIGH (ref 70–99)

## 2024-02-16 LAB — BLOOD GAS, VENOUS
Acid-base deficit: 7.9 mmol/L — ABNORMAL HIGH (ref 0.0–2.0)
Acid-base deficit: 2.7 mmol/L — ABNORMAL HIGH (ref 0.0–2.0)
Bicarbonate: 18.3 mmol/L — ABNORMAL LOW (ref 20.0–28.0)
Bicarbonate: 23.7 mmol/L (ref 20.0–28.0)
O2 Saturation: 71.3 %
O2 Saturation: 53.5 %
Patient temperature: 37
Patient temperature: 37
pCO2, Ven: 39 mmHg — ABNORMAL LOW (ref 44–60)
pCO2, Ven: 47 mmHg (ref 44–60)
pH, Ven: 7.28 (ref 7.25–7.43)
pH, Ven: 7.31 (ref 7.25–7.43)
pO2, Ven: 42 mmHg (ref 32–45)
pO2, Ven: 33 mmHg (ref 32–45)

## 2024-02-16 LAB — GLUCOSE, CAPILLARY
Glucose-Capillary: 139 mg/dL — ABNORMAL HIGH (ref 70–99)
Glucose-Capillary: 144 mg/dL — ABNORMAL HIGH (ref 70–99)
Glucose-Capillary: 161 mg/dL — ABNORMAL HIGH (ref 70–99)
Glucose-Capillary: 173 mg/dL — ABNORMAL HIGH (ref 70–99)
Glucose-Capillary: 193 mg/dL — ABNORMAL HIGH (ref 70–99)

## 2024-02-16 LAB — MRSA NEXT GEN BY PCR, NASAL: MRSA by PCR Next Gen: NOT DETECTED

## 2024-02-16 LAB — BETA-HYDROXYBUTYRIC ACID
Beta-Hydroxybutyric Acid: 6.26 mmol/L — ABNORMAL HIGH (ref 0.05–0.27)
Beta-Hydroxybutyric Acid: 0.54 mmol/L — ABNORMAL HIGH (ref 0.05–0.27)

## 2024-02-16 MED ORDER — ACETAMINOPHEN 325 MG PO TABS
650.0000 mg | ORAL_TABLET | Freq: Four times a day (QID) | ORAL | Status: DC | PRN
  Administered 2024-02-16 – 2024-02-19 (×3): 650 mg via ORAL
  Filled 2024-02-16 (×4): qty 2

## 2024-02-16 MED ORDER — ONDANSETRON HCL 4 MG/2ML IJ SOLN: 4.0000 mg | Freq: Four times a day (QID) | INTRAMUSCULAR | Status: DC | PRN

## 2024-02-16 MED ORDER — ORAL CARE MOUTH RINSE: 15.0000 mL | OROMUCOSAL | Status: DC | PRN

## 2024-02-16 MED ORDER — IPRATROPIUM-ALBUTEROL 0.5-2.5 (3) MG/3ML IN SOLN
3.0000 mL | Freq: Two times a day (BID) | RESPIRATORY_TRACT | Status: DC
  Administered 2024-02-16 – 2024-02-18 (×6): 3 mL via RESPIRATORY_TRACT
  Filled 2024-02-16 (×6): qty 3

## 2024-02-16 MED ORDER — ACETAMINOPHEN 500 MG PO TABS
1000.0000 mg | ORAL_TABLET | Freq: Once | ORAL | Status: AC
  Administered 2024-02-16: 1000 mg via ORAL
  Filled 2024-02-16: qty 2

## 2024-02-16 MED ORDER — ACETAMINOPHEN 650 MG RE SUPP: 650.0000 mg | Freq: Four times a day (QID) | RECTAL | Status: DC | PRN

## 2024-02-16 MED ORDER — ONDANSETRON HCL 4 MG PO TABS: 4.0000 mg | ORAL_TABLET | Freq: Four times a day (QID) | ORAL | Status: DC | PRN

## 2024-02-16 MED ORDER — DEXTROSE IN LACTATED RINGERS 5 % IV SOLN
INTRAVENOUS | Status: AC
  Filled 2024-02-16: qty 1000

## 2024-02-16 MED ORDER — INSULIN REGULAR(HUMAN) IN NACL 100-0.9 UT/100ML-% IV SOLN
INTRAVENOUS | Status: DC
  Administered 2024-02-16: 13 [IU]/h via INTRAVENOUS
  Filled 2024-02-16: qty 100

## 2024-02-16 MED ORDER — ENOXAPARIN SODIUM 40 MG/0.4ML IJ SOSY
40.0000 mg | PREFILLED_SYRINGE | INTRAMUSCULAR | Status: DC
  Administered 2024-02-16 – 2024-02-18 (×3): 40 mg via SUBCUTANEOUS
  Filled 2024-02-16 (×3): qty 0.4

## 2024-02-16 MED ORDER — DEXTROSE 50 % IV SOLN: 0.0000 mL | INTRAVENOUS | Status: DC | PRN

## 2024-02-16 MED ORDER — LACTATED RINGERS IV BOLUS
800.0000 mL | Freq: Once | INTRAVENOUS | Status: AC
  Administered 2024-02-16: 800 mL via INTRAVENOUS

## 2024-02-16 MED ORDER — AMLODIPINE BESYLATE 5 MG PO TABS
5.0000 mg | ORAL_TABLET | Freq: Once | ORAL | Status: AC
  Administered 2024-02-16: 5 mg via ORAL
  Filled 2024-02-16: qty 1

## 2024-02-16 MED ORDER — LACTATED RINGERS IV SOLN: INTRAVENOUS | Status: AC

## 2024-02-16 MED ORDER — INSULIN ASPART 100 UNIT/ML IJ SOLN
10.0000 [IU] | Freq: Once | INTRAMUSCULAR | Status: AC
  Administered 2024-02-16: 10 [IU] via INTRAVENOUS
  Filled 2024-02-16: qty 10

## 2024-02-16 MED ORDER — LEVOTHYROXINE SODIUM 100 MCG PO TABS
200.0000 ug | ORAL_TABLET | Freq: Every day | ORAL | Status: DC
  Administered 2024-02-17 – 2024-02-19 (×3): 200 ug via ORAL
  Filled 2024-02-16 (×3): qty 2

## 2024-02-16 MED ORDER — PANTOPRAZOLE SODIUM 40 MG PO TBEC
40.0000 mg | DELAYED_RELEASE_TABLET | Freq: Every day | ORAL | Status: DC
  Administered 2024-02-17 – 2024-02-19 (×3): 40 mg via ORAL
  Filled 2024-02-16 (×3): qty 1

## 2024-02-16 MED ORDER — CHLORHEXIDINE GLUCONATE CLOTH 2 % EX PADS
6.0000 | MEDICATED_PAD | Freq: Every day | CUTANEOUS | Status: DC
  Administered 2024-02-16 – 2024-02-18 (×2): 6 via TOPICAL

## 2024-02-16 MED ORDER — LACTATED RINGERS IV BOLUS
1000.0000 mL | Freq: Once | INTRAVENOUS | Status: AC
  Administered 2024-02-16: 1000 mL via INTRAVENOUS

## 2024-02-16 MED ORDER — BUDESONIDE 0.5 MG/2ML IN SUSP
0.5000 mg | Freq: Two times a day (BID) | RESPIRATORY_TRACT | Status: DC
  Administered 2024-02-16 – 2024-02-19 (×7): 0.5 mg via RESPIRATORY_TRACT
  Filled 2024-02-16 (×7): qty 2

## 2024-02-16 NOTE — ED Provider Notes (Signed)
 " [place] [facility] AT [facility] Provider Note   CSN: [id number] Arrival date & time: 02/15/24  1728     Patient presents with: Cough and Blood Sugar Problem   [name] is a 73 y.o. female.   Patient is a 73 year old female with a history of hypertension, diabetes, NSTEMI, CKD, factor V Leiden and hypothyroidism who is presenting today with several complaints.  She reports on Friday she started having malaise, myalgias, cough and congestion that has been persistent.  She has not had any shortness of breath, vomiting, chest pain or diarrhea.  She denies a sore throat.  Her cough has been nonproductive.  She has been taking Coricidin and diabetic approved tests and for the cough but reports that yesterday she started noticing her blood sugar was going up.  She has continued to use her insulin  and Lantus .  She denies any abdominal pain at this time.  She had gone in a [place] [employer] earlier this [place] and thinks that is probably where she got sick.  She did not get a flu shot this year.  The history is provided by the patient.  Cough      Prior to Admission medications  Medication Sig Start Date End Date Taking? Authorizing Provider  acetaminophen  (TYLENOL ) 500 MG tablet Take 500-1,000 mg by mouth every 6 (six) hours as needed for mild pain or headache.    [provider]  albuterol  (VENTOLIN  HFA) 108 (90 Base) MCG/ACT inhaler Inhale 2 puffs into the lungs every 6 (six) hours as needed for wheezing or shortness of breath. 02/08/20   Shona Terry SAILOR, DO  amLODipine  (NORVASC ) 5 MG tablet Take 1 tablet (5 mg total) by mouth daily. 08/26/23   Briana Elgin LABOR, MD  aspirin  (ASPIRIN  81) 81 MG chewable tablet Chew 1 tablet (81 mg total) by mouth daily. 08/26/23   Briana Elgin LABOR, MD  Blood Glucose Monitoring Suppl (ONETOUCH VERIO) w/Device KIT See admin instructions. 02/14/20   [provider]  ezetimibe -simvastatin  (VYTORIN ) 10-40 MG  tablet Take 1 tablet by mouth daily. PATIENT MUST SCHEDULE APPOINTMENT FOR FUTURE REFILLS. FIRST ATTEMPT 08/26/23   Briana Elgin LABOR, MD  ferrous sulfate  325 (65 FE) MG tablet Take 1 tablet (325 mg total) by mouth daily with breakfast. Patient not taking: Reported on 11/19/2023 08/08/21   Tobie Yetta HERO, MD  fluticasone  (FLONASE ) 50 MCG/ACT nasal spray Place 1 spray into both nostrils daily. Patient taking differently: Place 1 spray into both nostrils daily as needed for allergies. 03/29/22   Hazen Darryle FORBES, FNP  insulin  glargine (LANTUS  SOLOSTAR) 100 UNIT/ML Solostar Pen Inject 30 Units into the skin in the morning. 08/27/23   Briana Elgin LABOR, MD  insulin  lispro (HUMALOG  KWIKPEN) 100 UNIT/ML KwikPen Inject 7 Units into the skin 3 (three) times daily before meals. 08/26/23   Briana Elgin LABOR, MD  levothyroxine  (SYNTHROID , LEVOTHROID) 200 MCG tablet Take 200 mcg by mouth daily before breakfast.     [provider]  losartan  (COZAAR ) 100 MG tablet Take 1 tablet (100 mg total) by mouth daily. Patient taking differently: Take 50 mg by mouth daily. 02/09/20 11/19/23  Shona Terry SAILOR, DO  metoprolol  succinate (TOPROL -XL) 25 MG 24 hr tablet Take 1 tablet (25 mg total) by mouth daily. 08/26/23   Briana Elgin LABOR, MD  [facility] ULTRA test strip  01/25/20   [provider]  pantoprazole  (PROTONIX ) 40 MG tablet Take 1 tablet (40 mg total) by mouth  daily at 6 (six) AM. 08/26/23   Briana Elgin LABOR, MD  polyethylene glycol (MIRALAX  / GLYCOLAX ) 17 g packet Take 17 g by mouth daily as needed for mild constipation. Patient not taking: Reported on 11/19/2023 08/07/21   Tobie Yetta HERO, MD  RESTASIS 0.05 % ophthalmic emulsion Place 1 drop into both eyes 2 (two) times daily as needed (for dry eyes).    [provider]    Allergies: Patient has no known allergies.    Review of Systems  Respiratory:  Positive for cough.     Updated Vital Signs BP (!) 138/57 (BP Location: Left Arm)   Pulse 90   Temp 98.4 F  (36.9 C) (Oral)   Resp (!) 23   SpO2 100%   Physical Exam Vitals and nursing note reviewed.  Constitutional:      General: She is not in acute distress.    Appearance: She is well-developed.     Comments: Does not smell of ketones  HENT:     Head: Normocephalic and atraumatic.     Right Ear: Tympanic membrane normal.     Left Ear: Tympanic membrane normal.     Nose: Nose normal.     Mouth/Throat:     Mouth: Mucous membranes are dry.  Eyes:     Pupils: Pupils are equal, round, and reactive to light.  Cardiovascular:     Rate and Rhythm: Normal rate and regular rhythm.     Heart sounds: Normal heart sounds. No murmur heard.    No friction rub.  Pulmonary:     Effort: Pulmonary effort is normal.     Breath sounds: Normal breath sounds. No wheezing or rales.  Abdominal:     General: Bowel sounds are normal. There is no distension.     Palpations: Abdomen is soft.     Tenderness: There is no abdominal tenderness. There is no guarding or rebound.  Musculoskeletal:        General: No tenderness. Normal range of motion.     Right lower leg: No edema.     Left lower leg: No edema.     Comments: No edema  Skin:    General: Skin is warm and dry.     Findings: No rash.  Neurological:     Mental Status: She is alert and oriented to person, place, and time. Mental status is at baseline.     Cranial Nerves: No cranial nerve deficit.  Psychiatric:        Behavior: Behavior normal.     (all labs ordered are listed, but only abnormal results are displayed) Labs Reviewed  CBC - Abnormal; Notable for the following components:      Result Value   Hemoglobin 11.6 (*)    All other components within normal limits  URINALYSIS, ROUTINE W REFLEX MICROSCOPIC - Abnormal; Notable for the following components:   Color, Urine STRAW (*)    Glucose, UA >=500 (*)    Ketones, ur 5 (*)    All other components within normal limits  BLOOD GAS, VENOUS - Abnormal; Notable for the following  components:   pCO2, Ven 39 (*)    Bicarbonate 18.3 (*)    Acid-base deficit 7.9 (*)    All other components within normal limits  COMPREHENSIVE METABOLIC PANEL WITH GFR - Abnormal; Notable for the following components:   Sodium 126 (*)    Potassium 6.5 (*)    Chloride 89 (*)    CO2 15 (*)  Glucose, Bld 645 (*)    Creatinine, Ser 1.62 (*)    GFR, Estimated 33 (*)    Anion gap 22 (*)    All other components within normal limits  BETA-HYDROXYBUTYRIC ACID - Abnormal; Notable for the following components:   Beta-Hydroxybutyric Acid 6.26 (*)    All other components within normal limits  CBG MONITORING, ED - Abnormal; Notable for the following components:   Glucose-Capillary 552 (*)    All other components within normal limits  CBG MONITORING, ED - Abnormal; Notable for the following components:   Glucose-Capillary 562 (*)    All other components within normal limits  CBG MONITORING, ED - Abnormal; Notable for the following components:   Glucose-Capillary 554 (*)    All other components within normal limits  CBG MONITORING, ED - Abnormal; Notable for the following components:   Glucose-Capillary >600 (*)    All other components within normal limits  BASIC METABOLIC PANEL WITH GFR  BASIC METABOLIC PANEL WITH GFR  BASIC METABOLIC PANEL WITH GFR  BASIC METABOLIC PANEL WITH GFR    EKG: EKG Interpretation Date/Time:  Monday February 16 2024 09:05:32 EST Ventricular Rate:  96 PR Interval:  163 QRS Duration:  95 QT Interval:  374 QTC Calculation: 473 R Axis:   35  Text Interpretation: Sinus rhythm Confirmed by Doretha Folks ([id number]) on 02/16/2024 9:22:23 AM  Radiology: [facility] Chest Port 1 View Result Date: 02/16/2024 EXAM: 1 VIEW(S) XRAY OF THE CHEST 02/16/2024 07:38:00 AM COMPARISON: 09/16/2022 CLINICAL HISTORY: 73 year old female with cough and flu-like symptoms. FINDINGS: LUNGS AND PLEURA: No focal pulmonary opacity. No pleural effusion. No pneumothorax. HEART AND  MEDIASTINUM: No acute abnormality of the cardiac and mediastinal silhouettes. BONES AND SOFT TISSUES: No acute osseous abnormality. IMPRESSION: 1. No acute cardiopulmonary abnormality. Electronically signed by: Helayne Hurst MD 02/16/2024 07:46 AM EST RP Workstation: [id number]     Procedures   Medications Ordered in the ED  lactated ringers  infusion ( Intravenous Rate/Dose Change 02/16/24 1039)  insulin  regular, human (MYXREDLIN ) 100 units/ 100 mL infusion (has no administration in time range)  dextrose  5 % in lactated ringers  infusion (has no administration in time range)  dextrose  50 % solution 0-50 mL (has no administration in time range)  enoxaparin  (LOVENOX ) injection 40 mg (has no administration in time range)  acetaminophen  (TYLENOL ) tablet 650 mg (has no administration in time range)    Or  acetaminophen  (TYLENOL ) suppository 650 mg (has no administration in time range)  ondansetron  (ZOFRAN ) tablet 4 mg (has no administration in time range)    Or  ondansetron  (ZOFRAN ) injection 4 mg (has no administration in time range)  lactated ringers  bolus 1,000 mL (0 mLs Intravenous Stopped 02/16/24 0711)  acetaminophen  (TYLENOL ) tablet 1,000 mg (1,000 mg Oral Given 02/16/24 0846)  insulin  aspart (novoLOG ) injection 10 Units (10 Units Intravenous Given 02/16/24 0843)  amLODipine  (NORVASC ) tablet 5 mg (5 mg Oral Given 02/16/24 0920)  lactated ringers  bolus 800 mL (800 mLs Intravenous New Bag/Given 02/16/24 1038)    Clinical Course as of 02/16/24 1046  Mon Feb 16, 2024  0633 Comprehensive metabolic panel with GFR [DL]    Clinical Course User Index [DL] Lesesene, Deandra M, NT                                 Medical Decision Making Amount and/or Complexity of Data Reviewed External Data Reviewed: notes. Labs: ordered. Decision-making details documented in ED  Course. Radiology: ordered and independent interpretation performed. Decision-making details documented in ED  Course. ECG/medicine tests: ordered and independent interpretation performed. Decision-making details documented in ED Course.  Risk OTC drugs. Prescription drug management. Decision regarding hospitalization.   Pt with multiple medical problems and comorbidities and presenting today with a complaint that caries a high risk for morbidity and mortality.  Here today with the above complaints.  Patient is complaining of flulike symptoms with chills, myalgias, malaise and a cough.  Temperature of 99.2 upon arrival.  Concern for viral etiology which has then resulted in hyperglycemia.  Patient's blood sugars have been elevated at home despite her taking her normal medications.  Patient is compliant with medications.  She is not having symptoms classic for DKA as she is not having any abdominal pain vomiting and does not smell of ketones.  Patient is hypertensive but has not had her blood pressure medication and unfortunately has been waiting for 14 hours.  She is not displaying symptoms classic for acute cardiac or lung pathology.  Low suspicion for PE at this time.  Will rule out pneumonia with a chest x-ray. I independently interpreted patient's labs and and EKG patient's blood sugar after getting a liter of fluid is 562, VBG with a normal pH with mild metabolic acidosis with respiratory compensation, UA with 5 ketones but no evidence of infection, CBC within normal limits,  CMP does show a potassium of 6.5, hyponatremia of 126 and blood sugar was 645 with mild bump in Cr of 1.6 from her baseline of 1.2 with anion gap of 22.  EKG without acute findings. I have independently visualized and interpreted pt's images today.  Chest x-ray without acute findings. Will continue to treat patient's hyperglycemia which is felt to be in relation to having the flu and increased stress to her body.  She was given 10 units of insulin  and continued IV fluids.  Will recheck labs.  Repeat sugar is reading critically high and  pt is mild DKA.  Started on insulin  drip and will require admission.  Discussed with the pt and she is comfortable with this plan.  Consulted hospitalist. CRITICAL CARE Performed by: Tayvion Lauder Total critical care time: 30 minutes Critical care time was exclusive of separately billable procedures and treating other patients. Critical care was necessary to treat or prevent imminent or life-threatening deterioration. Critical care was time spent personally by me on the following activities: development of treatment plan with patient and/or surrogate as well as nursing, discussions with consultants, evaluation of patient's response to treatment, examination of patient, obtaining history from patient or surrogate, ordering and performing treatments and interventions, ordering and review of laboratory studies, ordering and review of radiographic studies, pulse oximetry and re-evaluation of patient's condition.        Final diagnoses:  Diabetic ketoacidosis without coma associated with type 2 diabetes mellitus (HCC)  Hyperkalemia  Influenza-like illness    ED Discharge Orders     None          Doretha Folks, MD 02/16/24 1046  "

## 2024-02-16 NOTE — Progress Notes (Signed)
" °   02/16/24 2300  BiPAP/CPAP/SIPAP  BiPAP/CPAP/SIPAP Pt Type Adult  Reason BIPAP/CPAP not in use Non-compliant (pt refusing cpap for the night)    "

## 2024-02-16 NOTE — ED Notes (Signed)
 4th sample sent to lab

## 2024-02-16 NOTE — H&P (Addendum)
 " History and Physical    Patient: [name] FMW:[id number] DOB: 1950/08/20 DOA: 02/15/2024 DOS: the patient was seen and examined on 02/16/2024 PCP: Rik Glinda DASEN, FNP  Patient coming from: Home  Chief Complaint:  Chief Complaint  Patient presents with   Cough   Blood Sugar Problem   HPI: [name] is a 73 y.o. 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  No hemoptysis.  Positive polyuria and polydipsia, but no blurred vision or polyphagia.  She has been using her insulin  as prescribed.  No chest pain, palpitations, diaphoresis, PND, orthopnea or pitting edema of the lower extremities.  No abdominal pain, nausea, emesis, diarrhea, constipation, melena or hematochezia.  No flank pain, dysuria, frequency or hematuria.   Lab work: Urinalysis showed glucose greater than 500 and ketones of 5 mg/dL.  CBC showed a white count of 4.1, hemoglobin 11.6 g/dL and platelets 715.  Venous blood gas showed a pH of 7.28, pCO2 of 39 and pO2 of 42 mmHg, HCO3 18.3 acid base deficit 7.9 mmol/L.  Beta hydroxybutyric acid was 6.26, sodium 126, potassium 6.5, chloride 89 and CO2 15 mmol/L with an anion gap of 22.  Glucose 645, BUN 23 and creatinine 1.62 mg/dL.  Calcium level and LFTs were normal.  Imaging: Portable 1 view chest radiograph with no acute cardiopulmonary abnormality.   ED  course: Initial vital signs were temperature 99.8 F, pulse 110, respirations 20, BP 186/105 mmHg and O2 sat 99% on room air.  The patient received acetaminophen  1000 mg p.o. x 1, amlodipine  5 mg p.o. x 1, NovoLog  10 units IV x 1, followed by an insulin  continuous infusion started on LR 125 mL/h after a 1000 mL LR bolus.  Review of Systems: As mentioned in the history of present illness. All other systems reviewed and are negative. Past Medical History:  Diagnosis Date   Clotting disorder    Constipation    history of   Diabetes mellitus without complication (HCC)    DVT (deep venous thrombosis) (HCC)    2007   Factor V Leiden    deficiency    GERD (gastroesophageal reflux disease)    Hypertension    Hypothyroidism    Kidney disease    early stage   NSTEMI (non-ST elevated myocardial infarction) (HCC)    2011 - possibly r/t DVT/embolization?    Sleep apnea    no c-pap   Past Surgical History:  Procedure Laterality Date   CARDIAC CATHETERIZATION  10/21/2009   mild bridging(?) segment in mid LAD, otherwise normal coronaries (Dr. LOIS Maxcy)   COLONOSCOPY     INGUINAL HERNIA REPAIR     when 6 months old   LOWER EXTREMITY ARTERIAL DOPPLER  2011   normal LEA   SLEEP STUDY  2011   AHI 8.5 and REM AHI 18   TRANSTHORACIC ECHOCARDIOGRAM  2011   EF 55-60%, trivial MR, TR, pulm valve regurg   TUBAL LIGATION  1995   UPPER GASTROINTESTINAL ENDOSCOPY     Social History:  reports that she has  never smoked. She has never used smokeless tobacco. She reports current alcohol  use. She reports that she does not use drugs.  Allergies[1]  Family History  Problem Relation Age of Onset   Lung cancer Father    Cancer Father    Diabetes Father    Other Mother        childbirth - enlarged heart   Cancer Paternal Grandmother    Diabetes Paternal Grandmother    Mental illness Paternal Grandmother    Hypertension Paternal Grandfather    Colon cancer Neg Hx     Prior to Admission medications   Medication Sig Start Date End Date Taking? Authorizing Provider  acetaminophen  (TYLENOL ) 500 MG tablet Take 500-1,000 mg by mouth every 6 (six) hours as needed for mild pain or headache.    [provider]  albuterol  (VENTOLIN  HFA) 108 (90 Base) MCG/ACT inhaler Inhale 2 puffs into the lungs every 6 (six) hours as needed for wheezing or shortness of breath. 02/08/20   Shona Terry SAILOR, DO  amLODipine  (NORVASC ) 5 MG tablet Take 1 tablet (5 mg total) by mouth daily. 08/26/23   Briana Elgin LABOR, MD  aspirin  (ASPIRIN  81) 81 MG chewable tablet Chew 1 tablet (81 mg total) by mouth daily. 08/26/23   Briana Elgin LABOR, MD  Blood Glucose Monitoring Suppl (ONETOUCH VERIO) w/Device KIT See admin instructions. 02/14/20   [provider]  ezetimibe -simvastatin  (VYTORIN ) 10-40 MG tablet Take 1 tablet by mouth daily. PATIENT MUST SCHEDULE APPOINTMENT FOR FUTURE REFILLS. FIRST ATTEMPT 08/26/23   Briana Elgin LABOR, MD  ferrous sulfate  325 (65 FE) MG tablet Take 1 tablet (325 mg total) by mouth daily with breakfast. Patient not taking: Reported on 11/19/2023 08/08/21   Tobie Yetta HERO, MD  fluticasone  (FLONASE ) 50 MCG/ACT nasal spray Place 1 spray into both nostrils daily. Patient taking differently: Place 1 spray into both nostrils daily as needed for allergies. 03/29/22   Hazen Darryle BRAVO, FNP  insulin  glargine (LANTUS  SOLOSTAR) 100 UNIT/ML Solostar Pen Inject 30 Units into the skin in the morning. 08/27/23   Briana Elgin LABOR, MD  insulin  lispro (HUMALOG  KWIKPEN) 100 UNIT/ML KwikPen Inject 7 Units into the skin 3 (three) times daily before meals. 08/26/23   Briana Elgin LABOR, MD  levothyroxine  (SYNTHROID , LEVOTHROID) 200 MCG tablet Take 200 mcg by mouth daily before breakfast.     [provider]  losartan  (COZAAR ) 100 MG tablet Take 1 tablet (100 mg total) by mouth daily. Patient taking differently: Take 50 mg by mouth daily. 02/09/20 11/19/23  Shona Terry SAILOR, DO  metoprolol  succinate (TOPROL -XL) 25 MG 24 hr  tablet Take 1 tablet (25 mg total) by mouth daily. 08/26/23   Briana Elgin LABOR, MD  [facility] ULTRA test strip  01/25/20   [provider]  pantoprazole  (PROTONIX ) 40 MG tablet Take 1 tablet (40 mg total) by mouth daily at 6 (six) AM. 08/26/23   Briana Elgin LABOR, MD  polyethylene glycol (MIRALAX  / GLYCOLAX ) 17 g packet Take 17 g by mouth daily as needed for mild constipation. Patient not taking: Reported on 11/19/2023 08/07/21   Tobie Yetta HERO, MD  RESTASIS 0.05 % ophthalmic emulsion Place 1 drop into both eyes 2 (two) times daily as needed (for dry eyes).    [provider]    Physical Exam: Vitals:   02/16/24 0225 02/16/24 0633 02/16/24 0920 02/16/24 1005  BP: (!) 151/74 (!) 181/69 (!) 156/60 (!) 138/57  Pulse: (!) 103 94  90  Resp: 18 18  (!)  23  Temp: 99.7 F (37.6 C) 99.2 F (37.3 C)    TempSrc: Oral     SpO2: 97% 96%  100%   Physical Exam Vitals reviewed.  Constitutional:      General: She is awake. She is not in acute distress.    Appearance: She is obese. She is ill-appearing.  HENT:     Head: Normocephalic.     Nose: No rhinorrhea.     Mouth/Throat:     Mouth: Mucous membranes are dry.  Eyes:     General: No scleral icterus.    Pupils: Pupils are equal, round, and reactive to light.  Neck:     Vascular: No JVD.  Cardiovascular:     Rate and Rhythm: Normal rate and regular rhythm.     Heart sounds: S1 normal and S2 normal.  Pulmonary:     Effort: Pulmonary effort is normal.     Breath sounds: Normal breath sounds. No wheezing, rhonchi or rales.  Abdominal:     General: Bowel sounds are normal. There is no distension.     Palpations: Abdomen is soft.     Tenderness: There is no abdominal tenderness. There is no right CVA tenderness or left CVA tenderness.  Musculoskeletal:     Cervical back: Neck supple.     Right lower leg: No edema.     Left lower leg: No edema.  Skin:    General: Skin is warm and dry.  Neurological:     General: No focal deficit  present.     Mental Status: She is alert and oriented to person, place, and time.  Psychiatric:        Mood and Affect: Mood normal.        Behavior: Behavior normal. Behavior is cooperative.     Data Reviewed:  Results are pending, will review when available.  EKG: Vent. rate 96 BPM  PR interval 163 ms  QRS duration 95 ms  QT/QTcB 374/473 ms  P-R-T axes 66 35 60  Sinus rhythm  Assessment and Plan: Principal Problem:   Type 2 diabetes mellitus with ketoacidosis without    coma, with long-term current use of insulin  (HCC)    DKA (diabetic ketoacidosis) (HCC) Observation/stepdown. Keep NPO. Continue IV fluids. Continue insulin  infusion. Monitor CBG closely. BMP every 4 hours. BHA every 8 hours. Replace electrolytes as needed. Consult diabetes coordinator. Transition to SQ insulin  per Endo tool.  Active Problems:   Acute renal failure superimposed  on stage 3a chronic kidney disease (HCC) Continue IV fluids. Hold ARB/ACE. Avoid hypotension. Avoid nephrotoxins. Monitor intake and output. Monitor renal function electrolytes.    Hyperkalemia Continue IV fluids and insulin  infusion. Follow-up potassium level.    Pseudohyponatremia Corrected sodium is 139 mL/L.    Cough In the setting of:   URI (upper respiratory infection) With:   Reactive airway disease with wheezing Coronavirus, influenza and RSV PCR test negative. Respiratory 20 pathogen PCR ordered by ED (pending). Will start DuoNeb twice daily. Will add nebulized budesonide  as well.    Idiopathic sleep related nonobstructive alveolar hypoventilation CPAP at bedtime.    Coronary artery disease Continue aspirin , Vytorin , amlodipine  and metoprolol .    Essential hypertension Hold losartan . Continue amlodipine  5 mg p.o. daily. Continue metoprolol  succinate 25 mg p.o. daily.    Hyperlipidemia Continue Vytorin  10-40 mg p    Peripheral vascular disorder due to diabetes mellitus (HCC) Continue  aspirin  and Vytorin .    Hypothyroidism Continue levothyroxine  200 mcg p.o. daily.  GERD without esophagitis Continue pantoprazole  40 mg p.o. daily.    Activated protein C resistance   Factor V Leiden mutation   History of DVT (deep vein thrombosis) Currently not on anticoagulation. Lovenox  for DVT prophylaxis. Patient to follow-up with PCP as an outpatient.     Advance Care Planning:   Code Status: Full Code   Consults:   Family Communication:   Severity of Illness: The appropriate patient status for this patient is OBSERVATION. Observation status is judged to be reasonable and necessary in order to provide the required intensity of service to ensure the patient's safety. The patient's presenting symptoms, physical exam findings, and initial radiographic and laboratory data in the context of their medical condition is felt to place them at decreased risk for further clinical deterioration. Furthermore, it is anticipated that the patient will be medically stable for discharge from the hospital within 2 midnights of admission.   Author: Alm Dorn Castor, MD 02/16/2024 10:16 AM  For on call review [email or web address].      [1] No Known Allergies  "

## 2024-02-16 NOTE — ED Notes (Signed)
 3 separate lab specimens sent to lab. Issues with each collection.

## 2024-02-16 NOTE — Inpatient Diabetes Management (Signed)
 Inpatient Diabetes Program Recommendations  AACE/ADA: New Consensus Statement on Inpatient Glycemic Control (2015)  Target Ranges:  Prepandial:   less than 140 mg/dL      Peak postprandial:   less than 180 mg/dL (1-2 hours)      Critically ill patients:  140 - 180 mg/dL   Lab Results  Component Value Date   GLUCAP 173 (H) 02/16/2024   HGBA1C 13.3 (H) 08/23/2023    Review of Glycemic Control  Diabetes history: DM2 Outpatient Diabetes medications: Lantus  30 QAM, Humalog  7 units TID Current orders for Inpatient glycemic control: IV insulin  per EndoTool for DKA  HgbA1C - 13.3% BHB - 6.26 CO2 - 21  AG - 15  Inpatient Diabetes Program Recommendations:    Need updated BHB.  Continues on IV insulin .  When ready to transition off IV insulin , give Semglee  1-2 hours prior to discontinuation of drip  Consider:  Semglee  12 units BID  Novolog  0-9 TID with meals and 0-5 HS  Novolog  4 units TID with meals if eating > 50%  Will see pt in am to discuss HgbA1C, verify home meds and discuss CGMs.  Continue to follow.  Thank you. Shona Brandy, RD, LDN, CDCES Inpatient Diabetes Coordinator [phone]

## 2024-02-16 NOTE — ED Notes (Signed)
Called for vitals no response

## 2024-02-17 DIAGNOSIS — E1110 Type 2 diabetes mellitus with ketoacidosis without coma: Secondary | ICD-10-CM | POA: Diagnosis not present

## 2024-02-17 DIAGNOSIS — E1151 Type 2 diabetes mellitus with diabetic peripheral angiopathy without gangrene: Secondary | ICD-10-CM | POA: Diagnosis not present

## 2024-02-17 DIAGNOSIS — D6851 Activated protein C resistance: Secondary | ICD-10-CM | POA: Diagnosis not present

## 2024-02-17 DIAGNOSIS — B348 Other viral infections of unspecified site: Secondary | ICD-10-CM

## 2024-02-17 DIAGNOSIS — N1831 Chronic kidney disease, stage 3a: Secondary | ICD-10-CM

## 2024-02-17 DIAGNOSIS — E1165 Type 2 diabetes mellitus with hyperglycemia: Secondary | ICD-10-CM | POA: Diagnosis not present

## 2024-02-17 DIAGNOSIS — N179 Acute kidney failure, unspecified: Secondary | ICD-10-CM

## 2024-02-17 DIAGNOSIS — J101 Influenza due to other identified influenza virus with other respiratory manifestations: Secondary | ICD-10-CM

## 2024-02-17 DIAGNOSIS — Z794 Long term (current) use of insulin: Secondary | ICD-10-CM

## 2024-02-17 DIAGNOSIS — I10 Essential (primary) hypertension: Secondary | ICD-10-CM | POA: Diagnosis not present

## 2024-02-17 LAB — RESPIRATORY PANEL BY PCR
Adenovirus: NOT DETECTED
Bordetella Parapertussis: NOT DETECTED
Bordetella pertussis: NOT DETECTED
Chlamydophila pneumoniae: NOT DETECTED
Coronavirus 229E: NOT DETECTED
Coronavirus HKU1: NOT DETECTED
Coronavirus NL63: NOT DETECTED
Coronavirus OC43: NOT DETECTED
Influenza A H3: DETECTED — AB
Influenza B: NOT DETECTED
Metapneumovirus: NOT DETECTED
Mycoplasma pneumoniae: NOT DETECTED
Parainfluenza Virus 1: NOT DETECTED
Parainfluenza Virus 2: NOT DETECTED
Parainfluenza Virus 3: NOT DETECTED
Parainfluenza Virus 4: NOT DETECTED
Respiratory Syncytial Virus: NOT DETECTED
Rhinovirus / Enterovirus: DETECTED — AB

## 2024-02-17 LAB — GLUCOSE, CAPILLARY
Glucose-Capillary: 109 mg/dL — ABNORMAL HIGH (ref 70–99)
Glucose-Capillary: 130 mg/dL — ABNORMAL HIGH (ref 70–99)
Glucose-Capillary: 134 mg/dL — ABNORMAL HIGH (ref 70–99)
Glucose-Capillary: 146 mg/dL — ABNORMAL HIGH (ref 70–99)
Glucose-Capillary: 147 mg/dL — ABNORMAL HIGH (ref 70–99)
Glucose-Capillary: 158 mg/dL — ABNORMAL HIGH (ref 70–99)
Glucose-Capillary: 196 mg/dL — ABNORMAL HIGH (ref 70–99)
Glucose-Capillary: 233 mg/dL — ABNORMAL HIGH (ref 70–99)
Glucose-Capillary: 250 mg/dL — ABNORMAL HIGH (ref 70–99)

## 2024-02-17 LAB — COMPREHENSIVE METABOLIC PANEL WITH GFR
ALT: 14 U/L (ref 0–44)
AST: 27 U/L (ref 15–41)
Albumin: 3.3 g/dL — ABNORMAL LOW (ref 3.5–5.0)
Alkaline Phosphatase: 75 U/L (ref 38–126)
Anion gap: 10 (ref 5–15)
BUN: 21 mg/dL (ref 8–23)
CO2: 23 mmol/L (ref 22–32)
Calcium: 8.7 mg/dL — ABNORMAL LOW (ref 8.9–10.3)
Chloride: 102 mmol/L (ref 98–111)
Creatinine, Ser: 1.32 mg/dL — ABNORMAL HIGH (ref 0.44–1.00)
GFR, Estimated: 42 mL/min — ABNORMAL LOW
Glucose, Bld: 144 mg/dL — ABNORMAL HIGH (ref 70–99)
Potassium: 3.7 mmol/L (ref 3.5–5.1)
Sodium: 136 mmol/L (ref 135–145)
Total Bilirubin: 0.2 mg/dL (ref 0.0–1.2)
Total Protein: 6.1 g/dL — ABNORMAL LOW (ref 6.5–8.1)

## 2024-02-17 LAB — CBC
HCT: 31 % — ABNORMAL LOW (ref 36.0–46.0)
Hemoglobin: 10.6 g/dL — ABNORMAL LOW (ref 12.0–15.0)
MCH: 28.3 pg (ref 26.0–34.0)
MCHC: 34.2 g/dL (ref 30.0–36.0)
MCV: 82.9 fL (ref 80.0–100.0)
Platelets: 253 K/uL (ref 150–400)
RBC: 3.74 MIL/uL — ABNORMAL LOW (ref 3.87–5.11)
RDW: 14.6 % (ref 11.5–15.5)
WBC: 3 K/uL — ABNORMAL LOW (ref 4.0–10.5)
nRBC: 0 % (ref 0.0–0.2)

## 2024-02-17 LAB — BETA-HYDROXYBUTYRIC ACID
Beta-Hydroxybutyric Acid: 0.22 mmol/L (ref 0.05–0.27)
Beta-Hydroxybutyric Acid: 1.09 mmol/L — ABNORMAL HIGH (ref 0.05–0.27)

## 2024-02-17 MED ORDER — INSULIN GLARGINE-YFGN 100 UNIT/ML ~~LOC~~ SOLN
20.0000 [IU] | Freq: Every day | SUBCUTANEOUS | Status: DC
  Administered 2024-02-17: 20 [IU] via SUBCUTANEOUS
  Filled 2024-02-17 (×2): qty 0.2

## 2024-02-17 MED ORDER — INSULIN ASPART 100 UNIT/ML IJ SOLN
4.0000 [IU] | Freq: Three times a day (TID) | INTRAMUSCULAR | Status: DC
  Administered 2024-02-17 (×2): 4 [IU] via SUBCUTANEOUS
  Filled 2024-02-17 (×2): qty 4

## 2024-02-17 MED ORDER — INSULIN ASPART 100 UNIT/ML IJ SOLN
0.0000 [IU] | Freq: Three times a day (TID) | INTRAMUSCULAR | Status: DC
  Administered 2024-02-17: 3 [IU] via SUBCUTANEOUS
  Administered 2024-02-17: 5 [IU] via SUBCUTANEOUS
  Administered 2024-02-18: 15 [IU] via SUBCUTANEOUS
  Administered 2024-02-18: 8 [IU] via SUBCUTANEOUS
  Administered 2024-02-19: 5 [IU] via SUBCUTANEOUS
  Filled 2024-02-17: qty 8
  Filled 2024-02-17: qty 3
  Filled 2024-02-17: qty 5
  Filled 2024-02-17: qty 15
  Filled 2024-02-17: qty 5

## 2024-02-17 MED ORDER — INSULIN ASPART 100 UNIT/ML IJ SOLN
0.0000 [IU] | Freq: Every day | INTRAMUSCULAR | Status: DC
  Administered 2024-02-17: 2 [IU] via SUBCUTANEOUS
  Filled 2024-02-17: qty 2

## 2024-02-17 MED ORDER — INSULIN ASPART 100 UNIT/ML IJ SOLN: 0.0000 [IU] | Freq: Three times a day (TID) | INTRAMUSCULAR | Status: DC

## 2024-02-17 NOTE — Evaluation (Signed)
 Physical Therapy Evaluation Patient Details Name: [name] MRN: [id number] DOB: [birth year] Today's Date: 02/17/2024  History of Present Illness  73 yo female admitted with DKA, ARF on CKD. Hx of DM, factor V Leyden deficienty, CAD, OSA, hypothyroidism, DVT, NSTEMI, PVD  Clinical Impression  On eval, pt was CGA for mobility. She walked a short distance into/out of bathroom with furniture cruising technique. After a short rest break, pt then walked in hallway ~60 feet with a RW (pt requested use of RW). Pt presents with general weakness, decreased activity tolerance, and impaired gait/balance. She reported having some dyspnea and mild lightheadedness with ambulation. O2 98% on RA. Will plan to follow pt during this hospital stay. Will recommend HHPT at this time-may not need if pt progresses well.         If plan is discharge home, recommend the following: A little help with walking and/or transfers;A little help with bathing/dressing/bathroom;Assistance with cooking/housework;Assist for transportation;Help with stairs or ramp for entrance   Can travel by private vehicle        Equipment Recommendations None recommended by PT  Recommendations for Other Services       Functional Status Assessment Patient has had a recent decline in their functional status and demonstrates the ability to make significant improvements in function in a reasonable and predictable amount of time.     Precautions / Restrictions Precautions Precautions: Fall Restrictions Weight Bearing Restrictions Per Provider Order: No      Mobility  Bed Mobility Overal bed mobility: Needs Assistance Bed Mobility: Sit to Supine       Sit to supine: Modified independent (Device/Increase time)   General bed mobility comments: increased time.    Transfers Overall transfer level: Needs assistance Equipment used: None, Rolling walker (2 wheels) Transfers: Sit to/from Stand Sit to Stand: Contact guard  assist           General transfer comment: Increased time and effort. Cues for safety, hand placement.    Ambulation/Gait Ambulation/Gait assistance: Contact guard assist Gait Distance (Feet): 60 Feet Assistive device: Rolling walker (2 wheels) Gait Pattern/deviations: Step-through pattern, Decreased stride length       General Gait Details: Slow gait speed. Pt tolerated activity well. Some dyspnea with ambulation. Mild lightheadedness reported as well. No LOB with RW use (pt requested to use RW on today). She also walked ~15 feet wtihout a device but furniture cruising  Stairs            Wheelchair Mobility     Tilt Bed    Modified Rankin (Stroke Patients Only)       Balance Overall balance assessment: Mild deficits observed, not formally tested                                           Pertinent Vitals/Pain Pain Assessment Pain Assessment: No/denies pain    Home Living Family/patient expects to be discharged to:: Private residence Living Arrangements: Children Available Help at Discharge: Family Type of Home: House Home Access: Stairs to enter       Home Layout: One level Home Equipment: [place] - single Librarian, Academic (2 wheels)      Prior Function Prior Level of Function : Independent/Modified Independent             Mobility Comments: uses cane PRN       Extremity/Trunk Assessment   Upper  Extremity Assessment Upper Extremity Assessment: Defer to OT evaluation    Lower Extremity Assessment Lower Extremity Assessment: Generalized weakness    Cervical / Trunk Assessment Cervical / Trunk Assessment: Normal  Communication   Communication Communication: No apparent difficulties    Cognition Arousal: Alert Behavior During Therapy: WFL for tasks assessed/performed                             Following commands: Intact       Cueing Cueing Techniques: Verbal cues     General Comments       Exercises     Assessment/Plan    PT Assessment Patient needs continued PT services  PT Problem List Decreased strength;Decreased activity tolerance;Decreased balance;Decreased mobility;Decreased knowledge of use of DME       PT Treatment Interventions DME instruction;Functional mobility training;Gait training;Balance training;Therapeutic activities;Therapeutic exercise;Patient/family education    PT Goals (Current goals can be found in the Care Plan section)  Acute Rehab PT Goals Patient Stated Goal: feel better. home. PT Goal Formulation: With patient Time For Goal Achievement: 03/02/24 Potential to Achieve Goals: Good    Frequency Min 3X/week     Co-evaluation               AM-PAC PT 6 Clicks Mobility  Outcome Measure Help needed turning from your back to your side while in a flat bed without using bedrails?: None Help needed moving from lying on your back to sitting on the side of a flat bed without using bedrails?: None Help needed moving to and from a bed to a chair (including a wheelchair)?: A Little Help needed standing up from a chair using your arms (e.g., wheelchair or bedside chair)?: A Little Help needed to walk in hospital room?: A Little Help needed climbing 3-5 steps with a railing? : A Little 6 Click Score: 20    End of Session   Activity Tolerance: Patient tolerated treatment well Patient left: in bed;with call bell/phone within reach   PT Visit Diagnosis: Difficulty in walking, not elsewhere classified (R26.2);Muscle weakness (generalized) (M62.81);Unsteadiness on feet (R26.81)    Time: 8553-8486 PT Time Calculation (min) (ACUTE ONLY): 27 min   Charges:   PT Evaluation $PT Eval Low Complexity: 1 Low   PT General Charges $$ ACUTE PT VISIT: 1 Visit           Dannial SQUIBB, PT Acute Rehabilitation  Office: [phone]

## 2024-02-17 NOTE — Progress Notes (Signed)
 " Progress Note   Patient: [name] FMW:[id number] DOB: Aug 18, 1950 DOA: 02/15/2024     0 DOS: the patient was seen and examined on 02/17/2024   Brief hospital course: [name] is a 73 y.o. 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  Patient tested positive for rhinovirus, influenza A.  Admitted to stepdown unit on IV insulin  per Endo tool  Assessment and Plan: Diabetic ketoacidosis- Patient's anion gap closed, bicarb improved, beta-hydroxybutyrate dropped to normal. Blood sugars within 150 range. Transition IV insulin  to subcu insulin  with long-acting 20 units, Premeal 4 units. Diabetes coordinator on board. Heart healthy carb consistent diet ordered.  Influenza A/rhinovirus infection- Patient has symptoms since Friday. Hold off on giving Tamiflu as it is more than 4 days. Continue supportive care.  Droplet precaution. DuoNebs as needed. PT OT evaluation.  Acute on chronic kidney injury stage III: Continue gentle IV hydration. Avoid ARB, ACE inhibitor. Continue to monitor daily renal function.  Hyperkalemia: Improved with IV fluids, insulin . Monitor daily electrolytes.  Pseudohyponatremia: Sodium improved with correction of blood sugars. Continue to trend electrolytes.  Factor V Leiden mutation Activated protein C resistance History of  DVT- Currently not on anticoagulation Continue Lovenox  for DVT prophylaxis  Hypothyroidism- Continue home dose levothyroxine .  GERD-on pantoprazole .        Out of bed to chair. Incentive spirometry. Nursing supportive care. Fall, aspiration precautions. Diet:  Diet Orders (From admission, onward)     Start     Ordered   02/17/24 0748  Diet heart healthy/carb modified Room service appropriate? Yes; Fluid consistency: Thin  Diet effective now       Question Answer Comment  Diet-HS Snack? Nothing   Room service appropriate? Yes   Fluid consistency: Thin      02/17/24 0749           DVT prophylaxis: enoxaparin  (LOVENOX ) injection 40 mg Start: 02/16/24 2200  Level of care: Telemetry   Code Status: Full Code  Subjective: Patient is seen and examined today morning.  She is weak, did not get out of bed.  Nausea better, has bodyaches.  Blood sugars improved.  Son at bedside.  Physical Exam: Vitals:   02/17/24 0900 02/17/24 1135 02/17/24 1459 02/17/24 1740  BP: (!) 157/55  (!) 167/84 (!) 165/70  Pulse: 85  84 80  Resp: 17  20 16   Temp: 98.6 F (37 C) 98.2 F (36.8 C) 99.2 F (37.3 C) 98.9 F (37.2 C)  TempSrc:  Oral Oral Oral  SpO2: 100%  99%   Weight:      Height:        General - Elderly African-American female, distress due to body aches HEENT - PERRLA, EOMI, atraumatic head, non tender sinuses. Lung - Clear, basilar rales, rhonchi, no wheezes. Heart - S1, S2 heard, no murmurs, rubs, trace pedal edema. Abdomen - Soft, non tender, bowel sounds good Neuro - Alert, awake and oriented x 3, non focal exam. Skin - Warm and dry.  Data Reviewed:      Latest Ref Rng &  Units 02/17/2024    3:10 AM 02/15/2024    8:46 PM 08/22/2023    6:35 PM  CBC  WBC 4.0 - 10.5 K/uL 3.0  4.1  12.2   Hemoglobin 12.0 - 15.0 g/dL 89.3  88.3  86.6   Hematocrit 36.0 - 46.0 % 31.0  37.2  40.5   Platelets 150 - 400 K/uL 253  284  363       Latest Ref Rng & Units 02/17/2024     3:10 AM 02/16/2024   10:15 PM 02/16/2024    6:49 PM  BMP  Glucose 70 - 99 mg/dL 855  814  852   BUN 8 - 23 mg/dL 21  22  24    Creatinine 0.44 - 1.00 mg/dL 8.67  8.68  8.47   Sodium 135 - 145 mmol/L 136  135  136   Potassium 3.5 - 5.1 mmol/L 3.7  3.8  4.3   Chloride 98 - 111 mmol/L 102  101  99   CO2 22 - 32 mmol/L 23  22  21    Calcium 8.9 - 10.3 mg/dL 8.7  9.0  9.4    DG Chest Port 1 View Result Date: 02/16/2024 EXAM: 1 VIEW(S) XRAY OF THE CHEST 02/16/2024 07:38:00 AM COMPARISON: 09/16/2022 CLINICAL HISTORY: 73 year old female with cough and flu-like symptoms. FINDINGS: LUNGS AND PLEURA: No focal pulmonary opacity. No pleural effusion. No pneumothorax. HEART AND MEDIASTINUM: No acute abnormality of the cardiac and mediastinal silhouettes. BONES AND SOFT TISSUES: No acute osseous abnormality. IMPRESSION: 1. No acute cardiopulmonary abnormality. Electronically signed by: Helayne Hurst MD 02/16/2024 07:46 AM EST RP Workstation: [id number]    Family Communication: Discussed with patient, son at bedside, understand and agree. All questions answered.  Disposition: Status is: Observation The patient remains OBS appropriate and will d/c before 2 midnights.  Planned Discharge Destination: Home with Home Health     Time spent: 51 minutes  Author: Concepcion Riser, MD 02/17/2024 6:25 PM Secure chat 7am to 7pm For on call review [email or web address].    "

## 2024-02-17 NOTE — TOC Initial Note (Signed)
 Transition of Care [facility]) - Initial/Assessment Note    Patient Details  Name: [name] MRN: [id number] Date of Birth: Nov 06, 1950  [facility]) CM/SW Contact:    Jon ONEIDA Anon, RN Phone Number: 02/17/2024, 3:52 PM  Clinical Narrative:                 Pt is from home with relatives. States she cares for teenage grandchildren in the home. Pt states she uses a cane for ambulation at baseline. PT/OT consulted, awaiting recommendations. Pt states son will provide transportation at discharge. ICM following for DC planning needs.    Expected Discharge Plan: Home w Home Health Services Barriers to Discharge: Continued Medical Work up   Patient Goals and CMS Choice Patient states their goals for this hospitalization and ongoing recovery are:: Return home CMS Medicare.gov Compare Post Acute Care list provided to:: Patient Choice offered to / list presented to : Patient [place] ownership interest in [facility].provided to:: Patient    Expected Discharge Plan and Services In-house Referral: NA Discharge Planning Services: CM Consult Post Acute Care Choice: Durable Medical Equipment Living arrangements for the past 2 months: Single Family Home                                      Prior Living Arrangements/Services Living arrangements for the past 2 months: Single Family Home Lives with:: Relatives Patient language and need for interpreter reviewed:: Yes Do you feel safe going back to the place where you live?: Yes      Need for Family Participation in Patient Care: Yes (Comment) Care giver support system in place?: Yes (comment) Current home services: DME [facility]) Criminal Activity/Legal Involvement Pertinent to Current Situation/Hospitalization: No - Comment as needed  Activities of Daily Living   ADL Screening (condition at time of admission) Independently performs ADLs?: Yes (appropriate for developmental age) Is the patient deaf or  have difficulty hearing?: No Does the patient have difficulty seeing, even when wearing glasses/contacts?: No Does the patient have difficulty concentrating, remembering, or making decisions?: No  Permission Sought/Granted Permission sought to share information with : Family Supports Permission granted to share information with : Yes, Verbal Permission Granted  Share Information with NAME: Darwish,Timothy  Son, Emergency Contact  [phone]           Emotional Assessment Appearance:: Appears stated age Attitude/Demeanor/Rapport: Engaged Affect (typically observed): Accepting Orientation: : Oriented to Self, Oriented to Place, Oriented to  Time, Oriented to Situation Alcohol  / Substance Use: Not Applicable Psych Involvement: No (comment)  Admission diagnosis:  Hyperkalemia [E87.5] DKA (diabetic ketoacidosis) (HCC) [E11.10] Influenza-like illness [J11.1] Diabetic ketoacidosis without coma associated with type 2 diabetes mellitus (HCC) [E11.10] Patient Active Problem List   Diagnosis Date Noted   URI (upper respiratory infection) 02/16/2024   Reactive airway disease with wheezing 02/16/2024   Hyperkalemia 02/16/2024   Pseudohyponatremia 02/16/2024   Peripheral vascular disorder due to diabetes mellitus (HCC) 08/22/2023   Venous insufficiency (chronic) (peripheral) 10/31/2021   Lower limb pain, inferior, left 10/31/2021   Edema of both lower extremities due to peripheral venous insufficiency 10/31/2021   DKA (diabetic ketoacidosis) (HCC) 08/05/2021   Acute metabolic encephalopathy 08/04/2021   Acute renal failure superimposed on stage 3a chronic kidney disease (HCC) 08/04/2021   Idiopathic sleep related nonobstructive alveolar hypoventilation 10/10/2020   Noncompliance with treatment 10/10/2020   Cyst of right breast 09/20/2020  Acute non-ST segment elevation myocardial infarction (HCC) 09/19/2020   Factor V Leiden mutation 09/19/2020   GERD without esophagitis 09/19/2020    Sleep apnea 09/19/2020   Kidney disease 09/19/2020   Activated protein C resistance 03/24/2020   Coronary artery disease 03/24/2020   Diabetic nephropathy (HCC) 03/24/2020   Hyperlipidemia 03/24/2020   Long term (current) use of insulin  (HCC) 03/24/2020   Old myocardial infarction 03/24/2020   Chest pain 01/06/2018   Chronic kidney disease, stage 3a (HCC) 01/06/2018   History of DVT (deep vein thrombosis) 07/18/2017   Right leg pain 07/18/2017   Swelling of both lower extremities 07/18/2017   Insulin -requiring or dependent type II diabetes mellitus (HCC) 07/01/2016   Hx of non-ST elevation myocardial infarction (NSTEMI) 07/01/2016   Screening for lipid disorders 07/01/2016   Type 2 diabetes mellitus with ketoacidosis without coma, with long-term current use of insulin  (HCC) 06/22/2015   Cough 11/27/2007   PHLEBITIS, SUPERFICIAL VEINS, UPPER LIMB 09/29/2006   Venous (peripheral) insufficiency 07/30/2006   Hypothyroidism 04/17/2006   Essential hypertension 04/17/2006   DEEP VEIN THROMBOPHLEBITIS, LEG 04/17/2006   PCP:  Rik Glinda DASEN, FNP Pharmacy:   [id number] [address], [place] - [address] AT [facility] OF [address] [place] [address] Phone: [phone] Fax: [phone]     Social Drivers of Health (SDOH) Social History: SDOH Screenings   Food Insecurity: No Food Insecurity (02/16/2024)  Housing: Low Risk (02/16/2024)  Transportation Needs: No Transportation Needs (02/16/2024)  Utilities: Not At Risk (02/16/2024)  Social Connections: Moderately Integrated (02/16/2024)  Tobacco Use: Low Risk (02/15/2024)   SDOH Interventions:     Readmission Risk Interventions     No data to display

## 2024-02-17 NOTE — Progress Notes (Signed)
" °   02/17/24 2248  BiPAP/CPAP/SIPAP  BiPAP/CPAP/SIPAP Pt Type Adult  Reason BIPAP/CPAP not in use Non-compliant (Patient refused CPAP qhs.  Patient encouraged to contact RT should she change her mind.)    "

## 2024-02-17 NOTE — Plan of Care (Signed)
" °  Problem: Education: Goal: Ability to describe self-care measures that may prevent or decrease complications (Diabetes Survival Skills Education) will improve Outcome: Progressing Goal: Individualized Educational Video(s) Outcome: Progressing   Problem: Metabolic: Goal: Ability to maintain appropriate glucose levels will improve Outcome: Progressing   Problem: Skin Integrity: Goal: Risk for impaired skin integrity will decrease Outcome: Progressing   Problem: Health Behavior/Discharge Planning: Goal: Ability to identify and utilize available resources and services will improve Outcome: Progressing Goal: Ability to manage health-related needs will improve Outcome: Progressing   "

## 2024-02-17 NOTE — Care Management Obs Status (Signed)
 MEDICARE OBSERVATION STATUS NOTIFICATION   Patient Details  Name: GABRIELLE WAKELAND MRN: [id number] Date of Birth: 1950/07/30   Medicare Observation Status Notification Given:  Yes    Jon ONEIDA Anon, RN 02/17/2024, 3:50 PM

## 2024-02-18 DIAGNOSIS — E1110 Type 2 diabetes mellitus with ketoacidosis without coma: Secondary | ICD-10-CM | POA: Diagnosis not present

## 2024-02-18 DIAGNOSIS — B348 Other viral infections of unspecified site: Secondary | ICD-10-CM | POA: Diagnosis not present

## 2024-02-18 DIAGNOSIS — I10 Essential (primary) hypertension: Secondary | ICD-10-CM

## 2024-02-18 DIAGNOSIS — J101 Influenza due to other identified influenza virus with other respiratory manifestations: Secondary | ICD-10-CM | POA: Diagnosis not present

## 2024-02-18 DIAGNOSIS — D6851 Activated protein C resistance: Secondary | ICD-10-CM | POA: Diagnosis not present

## 2024-02-18 DIAGNOSIS — E1165 Type 2 diabetes mellitus with hyperglycemia: Secondary | ICD-10-CM

## 2024-02-18 LAB — BASIC METABOLIC PANEL WITH GFR
Anion gap: 17 — ABNORMAL HIGH (ref 5–15)
BUN: 12 mg/dL (ref 8–23)
CO2: 18 mmol/L — ABNORMAL LOW (ref 22–32)
Calcium: 8.8 mg/dL — ABNORMAL LOW (ref 8.9–10.3)
Chloride: 99 mmol/L (ref 98–111)
Creatinine, Ser: 1.18 mg/dL — ABNORMAL HIGH (ref 0.44–1.00)
GFR, Estimated: 49 mL/min — ABNORMAL LOW
Glucose, Bld: 437 mg/dL — ABNORMAL HIGH (ref 70–99)
Potassium: 4.5 mmol/L (ref 3.5–5.1)
Sodium: 133 mmol/L — ABNORMAL LOW (ref 135–145)

## 2024-02-18 LAB — GLUCOSE, CAPILLARY
Glucose-Capillary: 427 mg/dL — ABNORMAL HIGH (ref 70–99)
Glucose-Capillary: 269 mg/dL — ABNORMAL HIGH (ref 70–99)
Glucose-Capillary: 65 mg/dL — ABNORMAL LOW (ref 70–99)
Glucose-Capillary: 80 mg/dL (ref 70–99)
Glucose-Capillary: 121 mg/dL — ABNORMAL HIGH (ref 70–99)

## 2024-02-18 LAB — HEMOGLOBIN A1C
Hgb A1c MFr Bld: 11 % — ABNORMAL HIGH (ref 4.8–5.6)
Mean Plasma Glucose: 269 mg/dL

## 2024-02-18 MED ORDER — INSULIN GLARGINE-YFGN 100 UNIT/ML ~~LOC~~ SOLN
35.0000 [IU] | Freq: Every day | SUBCUTANEOUS | Status: DC
  Administered 2024-02-18 – 2024-02-19 (×2): 35 [IU] via SUBCUTANEOUS
  Filled 2024-02-18 (×2): qty 0.35

## 2024-02-18 MED ORDER — INSULIN ASPART 100 UNIT/ML IJ SOLN
7.0000 [IU] | Freq: Three times a day (TID) | INTRAMUSCULAR | Status: DC
  Administered 2024-02-18: 7 [IU] via SUBCUTANEOUS
  Filled 2024-02-18: qty 7

## 2024-02-18 MED ORDER — GUAIFENESIN-DM 100-10 MG/5ML PO SYRP
5.0000 mL | ORAL_SOLUTION | ORAL | Status: DC | PRN
  Administered 2024-02-18 – 2024-02-19 (×5): 5 mL via ORAL
  Filled 2024-02-18 (×5): qty 5

## 2024-02-18 MED ORDER — INSULIN ASPART 100 UNIT/ML IJ SOLN
7.0000 [IU] | Freq: Three times a day (TID) | INTRAMUSCULAR | Status: DC
  Administered 2024-02-18 – 2024-02-19 (×2): 7 [IU] via SUBCUTANEOUS
  Filled 2024-02-18: qty 7

## 2024-02-18 NOTE — Progress Notes (Signed)
 Patient asking for something sleep at night. At home she take trazodone  50mg . She also states that she has foam in her urine.

## 2024-02-18 NOTE — Progress Notes (Signed)
" °   02/18/24 2056  BiPAP/CPAP/SIPAP  BiPAP/CPAP/SIPAP Pt Type Adult  Reason BIPAP/CPAP not in use Non-compliant (Patient continues to refuse cpap qhs.)    "

## 2024-02-18 NOTE — Evaluation (Signed)
 Occupational Therapy Evaluation Patient Details Name: [name] MRN: [id number] DOB: 02/27/1950 Today's Date: 02/18/2024   History of Present Illness   73 yr old female admitted with cough, wheezing, and rhinorrhea. Pt was found to have DKA, acute renal failure on CKD, and influenza A. PMH:  DM, factor V Leyden deficienty, CAD, OSA, hypothyroidism, DVT, NSTEMI, PVD     Clinical Impressions The pt is currently presenting slightly below her baseline level of functioning for the management of ADLs. During the session today, she performed all assessed tasks at supervision level, including lower body dressing, sit to stand, and ambulating into the hall using a RW. Her O2 saturation was noted to be 95-96% on room air with activity. She will benefit from OT services in the hospital setting to maximize her functional independence & to facilitate her safe return home. No post-acute OT needs anticipated.      If plan is discharge home, recommend the following:   Help with stairs or ramp for entrance;Assistance with cooking/housework     Functional Status Assessment   Patient has had a recent decline in their functional status and demonstrates the ability to make significant improvements in function in a reasonable and predictable amount of time.     Equipment Recommendations   None recommended by OT     Recommendations for Other Services         Precautions/Restrictions         Mobility Bed Mobility Overal bed mobility: Modified Independent       Supine to sit: Modified independent (Device/Increase time)          Transfers Overall transfer level: Needs assistance Equipment used: None Transfers: Sit to/from Stand Sit to Stand: Supervision           Balance     Sitting balance-Leahy Scale: Good       Standing balance-Leahy Scale: Fair           ADL either performed or assessed with clinical judgement   ADL Overall ADL's : Needs  assistance/impaired Eating/Feeding: Independent;Sitting   Grooming: Set up;Standing   Upper Body Bathing: Set up;Sitting   Lower Body Bathing: Set up;Supervison/ safety;Sit to/from stand   Upper Body Dressing : Set up;Sitting   Lower Body Dressing: Set up;Sitting/lateral leans Lower Body Dressing Details (indicate cue type and reason): She doffed then donned her socks seated EOB. Toilet Transfer: Supervision/safety;Set up;Ambulation;Rolling walker (2 wheels)   Toileting- Clothing Manipulation and Hygiene: Set up;Supervision/safety;Sit to/from stand Toileting - Clothing Manipulation Details (indicate cue type and reason): at bathroom level             Vision   Additional Comments: She correctly read the time depicted on the wall clock.            Pertinent Vitals/Pain Pain Assessment Pain Assessment: No/denies pain     Extremity/Trunk Assessment Upper Extremity Assessment Upper Extremity Assessment: Right hand dominant;LUE deficits/detail;RUE deficits/detail RUE Deficits / Details: AROM and strength WFL LUE Deficits / Details: AROM and strength WFL   Lower Extremity Assessment Lower Extremity Assessment: Overall WFL for tasks assessed       Communication Communication Communication: No apparent difficulties   Cognition Arousal: Alert Behavior During Therapy: WFL for tasks assessed/performed Cognition: No apparent impairments             OT - Cognition Comments: Oriented x4            Following commands: Intact       Cueing  General Comments   Cueing Techniques: Verbal cues              Home Living Family/patient expects to be discharged to:: Private residence Living Arrangements: Children;Other (Comment) (59 yr old granddaughter and 60 year old grandson) Available Help at Discharge: Family Type of Home: House Home Access: Stairs to enter Secretary/administrator of Steps: 3 Entrance Stairs-Rails: Left;Right Home Layout: One level      Bathroom Shower/Tub: Walk-in shower         Home Equipment: Cane - single Librarian, Academic (2 wheels);BSC/3in1          Prior Functioning/Environment Prior Level of Function : Independent/Modified Independent;Driving      ADLs Comments: Independent with ADLs, cooking, cleaning and driving.    OT Problem List: Decreased strength;Decreased knowledge of use of DME or AE;Decreased activity tolerance   OT Treatment/Interventions: Self-care/ADL training;Therapeutic exercise;Energy conservation;DME and/or AE instruction;Therapeutic activities;Balance training;Patient/family education      OT Goals(Current goals can be found in the care plan section)   Acute Rehab OT Goals Patient Stated Goal: to discharge home today OT Goal Formulation: With patient Time For Goal Achievement: 03/03/24 Potential to Achieve Goals: Good ADL Goals Pt Will Perform Grooming: with modified independence;standing Pt Will Perform Lower Body Dressing: with modified independence;sit to/from stand Pt Will Transfer to Toilet: with modified independence;ambulating Pt Will Perform Toileting - Clothing Manipulation and hygiene: with modified independence;sit to/from stand Pt/caregiver will Perform Home Exercise Program: With theraband;With Supervision;Right Upper extremity;Left upper extremity;Increased strength   OT Frequency:  Min 1X/week       AM-PAC OT 6 Clicks Daily Activity     Outcome Measure Help from another person eating meals?: None Help from another person taking care of personal grooming?: None Help from another person toileting, which includes using toliet, bedpan, or urinal?: A Little Help from another person bathing (including washing, rinsing, drying)?: A Little Help from another person to put on and taking off regular upper body clothing?: None Help from another person to put on and taking off regular lower body clothing?: A Little 6 Click Score: 21   End of Session Equipment  Utilized During Treatment: Gait belt;Rolling walker (2 wheels) Nurse Communication: Mobility status  Activity Tolerance: Patient tolerated treatment well Patient left: in chair;with call bell/phone within reach  OT Visit Diagnosis: Muscle weakness (generalized) (M62.81)                Time: 8957-8941 OT Time Calculation (min): 16 min Charges:  OT General Charges $OT Visit: 1 Visit OT Evaluation $OT Eval Low Complexity: 1 Low    Delanna JINNY Lesches, OTR/L 02/18/2024, 12:15 PM

## 2024-02-18 NOTE — Progress Notes (Signed)
 " Progress Note   Patient: [name] FMW:[id number] DOB: [birth year] DOA: 02/15/2024     0 DOS: the patient was seen and examined on 02/18/2024   Brief hospital course: [name] is a 73 y.o. 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  Patient tested positive for rhinovirus, influenza A.  Admitted to stepdown unit on IV insulin  per Endo tool  Assessment and Plan: Diabetic ketoacidosis- resolved. Uncontrolled type 2 diabetes with hyperglycemia- A1c 13.3, repeat A1C ordered. Sugars are labile. Blood sugars elevated today to 400s, but then this evening down to 65. Long-acting insulin  dose increased to 35 units, Premeal dose increased to 7 units.  Discussed about diet, diabetes education. Diabetes coordinator on board. Plan to discharge her tomorrow once sugars better.  Influenza A/ Rhinovirus infection- Patient has symptoms since Friday.  Not started on Tamiflu. Continue supportive care.  Droplet precaution. DuoNebs as needed. PT OT worked with her, advised home health.  Acute on chronic kidney injury stage III: Improved with IV hydration. Continue to monitor daily renal function.  Hyperkalemia: Improved with IV fluids, insulin . Monitor daily electrolytes.  Pseudohyponatremia: Corrected sodium normal for high blood sugars. Continue to trend  electrolytes.  Factor V Leiden mutation Activated protein C resistance History of DVT- Currently not on anticoagulation Continue Lovenox  for DVT prophylaxis  Hypothyroidism- Continue home dose levothyroxine .  GERD-on pantoprazole .    Out of bed to chair. Incentive spirometry. Nursing supportive care. Fall, aspiration precautions. Diet:  Diet Orders (From admission, onward)     Start     Ordered   02/17/24 0748  Diet heart healthy/carb modified Room service appropriate? Yes; Fluid consistency: Thin  Diet effective now       Question Answer Comment  Diet-HS Snack? Nothing   Room service appropriate? Yes   Fluid consistency: Thin      02/17/24 0749           DVT prophylaxis: enoxaparin  (LOVENOX ) injection 40 mg Start: 02/16/24 2200  Level of care: Telemetry   Code Status: Full Code  Subjective: Patient is seen and examined today morning.  She feels weak, has bodyaches.  Blood sugars are labile.  Wishes to stay another day as she does not have nebulizer treatment at home. Physical Exam: Vitals:   02/17/24 2053 02/17/24 2158 02/18/24 0521 02/18/24 1244  BP: (!) 155/72  (!) 168/85 (!) 167/83  Pulse: 82  86 89  Resp: 18   15  Temp: 99.5 F (37.5 C)  99.1 F (37.3 C) 99.6 F (37.6 C)  TempSrc: Oral     SpO2: 100% 99% 98% 96%  Weight:      Height:        General - Elderly African-American female, no apparent distress HEENT - PERRLA, EOMI, atraumatic head, non tender sinuses. Lung - Clear, basilar rales, rhonchi, no wheezes. Heart - S1, S2 heard, no murmurs, rubs, trace pedal edema. Abdomen - Soft, non tender, bowel sounds good Neuro - Alert, awake and oriented x 3, non focal exam. Skin - Warm and dry.  Data  Reviewed:      Latest Ref Rng & Units 02/17/2024    3:10 AM 02/15/2024    8:46 PM 08/22/2023    6:35 PM  CBC  WBC 4.0 - 10.5 K/uL 3.0  4.1  12.2   Hemoglobin 12.0 - 15.0 g/dL 89.3  88.3  86.6   Hematocrit 36.0 - 46.0 % 31.0  37.2  40.5   Platelets  150 - 400 K/uL 253  284  363       Latest Ref Rng & Units 02/18/2024    6:49 AM 02/17/2024    3:10 AM 02/16/2024   10:15 PM  BMP  Glucose 70 - 99 mg/dL 562  855  814   BUN 8 - 23 mg/dL 12  21  22    Creatinine 0.44 - 1.00 mg/dL 8.81  8.67  8.68   Sodium 135 - 145 mmol/L 133  136  135   Potassium 3.5 - 5.1 mmol/L 4.5  3.7  3.8   Chloride 98 - 111 mmol/L 99  102  101   CO2 22 - 32 mmol/L 18  23  22    Calcium 8.9 - 10.3 mg/dL 8.8  8.7  9.0    No results found.   Family Communication: Discussed with patient,  she understand and agree. All questions answered.  Disposition: Status is: Observation The patient remains OBS appropriate and will d/c before 2 midnights.  Planned Discharge Destination: Home with Home Health     Time spent: 44 minutes  Author: Concepcion Riser, MD 02/18/2024 5:34 PM Secure chat 7am to 7pm For on call review [email or web address].    "

## 2024-02-18 NOTE — Inpatient Diabetes Management (Signed)
 Inpatient Diabetes Program Recommendations  AACE/ADA: New Consensus Statement on Inpatient Glycemic Control (2015)  Target Ranges:  Prepandial:   less than 140 mg/dL      Peak postprandial:   less than 180 mg/dL (1-2 hours)      Critically ill patients:  140 - 180 mg/dL   Lab Results  Component Value Date   GLUCAP 427 (H) 02/18/2024   HGBA1C 13.3 (H) 08/23/2023    Review of Glycemic Control  Diabetes history: DM2 Outpatient Diabetes medications: Lantus  30 QAM, Humalog  7 TID Current orders for Inpatient glycemic control: Semglee  35 daily, Novolog  0-15 TID with meals and 0-5 HS + 7 units TID with meals  HgbA1C - 13.3% PCP - Glinda Like, FNP 2nd admission for DKA this year. CBGs 437 this am  Inpatient Diabetes Program Recommendations:    Agree with increasing Semglee  to 35 units daily  Spoke with pt at bedside regarding her diabetes and HgbA1C of 13.3%. Pt admits to taking insulin  as prescribed. HgbA1C likely elevated, also, from virus and flu. Saw pt when hospitalized back in July 2025, and HgbA1C was same. Was not monitoring blood sugars or taking insulin  as prescribed. Has not been to PCP in over a year. Having issues in the past in getting her Lantus .   Discussed again her HgbA1C of 13.3% and importance of improving this to < 8%. Pt states she understands and wants to start taking care of herself again.   We discussed glucose and A1C goals. Discussed importance of checking CBGs and maintaining good CBG control to prevent long-term and short-term complications. Explained how hyperglycemia leads to damage within blood vessels which lead to the common complications seen with uncontrolled diabetes. Stressed to the patient the importance of improving glycemic control to prevent further complications from uncontrolled diabetes. Discussed impact of nutrition, exercise, stress, sickness, and medications on diabetes control. Pt states she is aware of complications of uncontrolled blood  sugars. Answered all questions. May benefit from f/u with OP Diabetes Education referral.   Thank you. Shona Brandy, RD, LDN, CDCES Inpatient Diabetes Coordinator [phone]

## 2024-02-19 DIAGNOSIS — K219 Gastro-esophageal reflux disease without esophagitis: Secondary | ICD-10-CM | POA: Diagnosis not present

## 2024-02-19 DIAGNOSIS — J101 Influenza due to other identified influenza virus with other respiratory manifestations: Secondary | ICD-10-CM | POA: Diagnosis not present

## 2024-02-19 DIAGNOSIS — E1110 Type 2 diabetes mellitus with ketoacidosis without coma: Secondary | ICD-10-CM | POA: Diagnosis not present

## 2024-02-19 DIAGNOSIS — Z794 Long term (current) use of insulin: Secondary | ICD-10-CM | POA: Diagnosis not present

## 2024-02-19 DIAGNOSIS — B348 Other viral infections of unspecified site: Secondary | ICD-10-CM | POA: Diagnosis not present

## 2024-02-19 DIAGNOSIS — D6851 Activated protein C resistance: Secondary | ICD-10-CM | POA: Diagnosis not present

## 2024-02-19 DIAGNOSIS — N179 Acute kidney failure, unspecified: Secondary | ICD-10-CM | POA: Diagnosis not present

## 2024-02-19 DIAGNOSIS — E1165 Type 2 diabetes mellitus with hyperglycemia: Secondary | ICD-10-CM | POA: Diagnosis not present

## 2024-02-19 DIAGNOSIS — E039 Hypothyroidism, unspecified: Secondary | ICD-10-CM

## 2024-02-19 DIAGNOSIS — I10 Essential (primary) hypertension: Secondary | ICD-10-CM | POA: Diagnosis not present

## 2024-02-19 DIAGNOSIS — N1831 Chronic kidney disease, stage 3a: Secondary | ICD-10-CM | POA: Diagnosis not present

## 2024-02-19 DIAGNOSIS — E875 Hyperkalemia: Secondary | ICD-10-CM | POA: Diagnosis not present

## 2024-02-19 LAB — CBC
HCT: 34.5 % — ABNORMAL LOW (ref 36.0–46.0)
Hemoglobin: 11.9 g/dL — ABNORMAL LOW (ref 12.0–15.0)
MCH: 28.6 pg (ref 26.0–34.0)
MCHC: 34.5 g/dL (ref 30.0–36.0)
MCV: 82.9 fL (ref 80.0–100.0)
Platelets: 243 K/uL (ref 150–400)
RBC: 4.16 MIL/uL (ref 3.87–5.11)
RDW: 14.6 % (ref 11.5–15.5)
WBC: 3.7 K/uL — ABNORMAL LOW (ref 4.0–10.5)
nRBC: 0 % (ref 0.0–0.2)

## 2024-02-19 LAB — BASIC METABOLIC PANEL WITH GFR
Anion gap: 12 (ref 5–15)
BUN: 12 mg/dL (ref 8–23)
CO2: 22 mmol/L (ref 22–32)
Calcium: 9.2 mg/dL (ref 8.9–10.3)
Chloride: 102 mmol/L (ref 98–111)
Creatinine, Ser: 1.1 mg/dL — ABNORMAL HIGH (ref 0.44–1.00)
GFR, Estimated: 53 mL/min — ABNORMAL LOW
Glucose, Bld: 227 mg/dL — ABNORMAL HIGH (ref 70–99)
Potassium: 4.3 mmol/L (ref 3.5–5.1)
Sodium: 135 mmol/L (ref 135–145)

## 2024-02-19 LAB — GLUCOSE, CAPILLARY: Glucose-Capillary: 222 mg/dL — ABNORMAL HIGH (ref 70–99)

## 2024-02-19 MED ORDER — GUAIFENESIN-DM 100-10 MG/5ML PO SYRP: 5.0000 mL | ORAL_SOLUTION | ORAL | 0 refills | Status: AC | PRN

## 2024-02-19 MED ORDER — ALBUTEROL SULFATE (2.5 MG/3ML) 0.083% IN NEBU: 2.5000 mg | INHALATION_SOLUTION | Freq: Four times a day (QID) | RESPIRATORY_TRACT | Status: DC | PRN

## 2024-02-19 NOTE — Discharge Summary (Signed)
 " Physician Discharge Summary   Patient: [name] MRN: [id number] DOB: 16-Jan-1951  Admit date:     02/15/2024  Discharge date: {dischdate:26783}  Discharge Physician: Concepcion Riser   PCP: Rik Glinda DASEN, FNP   Recommendations at discharge:  {Tip this will not be part of the note when signed- Example include specific recommendations for outpatient follow-up, pending tests to follow-up on. (Optional):26781}  ***  Discharge Diagnoses: Principal Problem:   DKA (diabetic ketoacidosis) (HCC) Active Problems:   Type 2 diabetes mellitus with ketoacidosis without coma, with long-term current use of insulin  (HCC)   Acute renal failure superimposed on stage 3a chronic kidney disease (HCC)   Essential hypertension   Hypothyroidism   GERD without esophagitis   Cough   History of DVT (deep vein thrombosis)   Activated protein C resistance   Coronary artery disease   Hyperlipidemia   Factor V Leiden mutation   Idiopathic sleep related nonobstructive alveolar hypoventilation   Peripheral vascular disorder due to diabetes mellitus (HCC)   URI (upper respiratory infection)   Reactive airway disease with wheezing   Hyperkalemia   Pseudohyponatremia   Influenza A   Rhinovirus infection  Resolved Problems:   * No resolved hospital problems. [facility] Course: No notes on file  Assessment and Plan: No notes have been filed under this hospital service. Service: Hospitalist     {Tip this will not be part of the note when signed Body mass index is 35.48 kg/m. , ,  (Optional):26781}  {(NOTE) Pain control PDMP Statment (Optional):26782} Consultants: *** Procedures performed: ***  Disposition: {Plan; Disposition:26390} Diet recommendation:  Discharge Diet Orders (From admission, onward)     Start     Ordered   01/74/26 0000  Diet - low sodium heart healthy        02/19/24 1045   01/74/26 0000  Diet Carb Modified        02/19/24 1045            {Diet_Plan:26776} DISCHARGE MEDICATION: Allergies as of 02/19/2024   No Known Allergies      Medication List     TAKE these medications    acetaminophen  500 MG tablet Commonly known as: TYLENOL  Take 500-1,000 mg by mouth every 6 (six) hours as needed for mild pain or headache.   albuterol  108 (90 Base) MCG/ACT inhaler Commonly known as: VENTOLIN  HFA Inhale 2 puffs into the lungs every 6 (six) hours as needed for wheezing or shortness of breath.   amLODipine  5 MG tablet Commonly known as: NORVASC  Take 1 tablet (5 mg total) by mouth daily.   aspirin  81 MG chewable tablet Commonly known as: Aspirin  81 Chew 1 tablet (81 mg total) by mouth daily.   atorvastatin 10 MG tablet Commonly known as: LIPITOR Take 10 mg by mouth daily.   ezetimibe -simvastatin  10-40 MG tablet Commonly known as: VYTORIN  Take 1 tablet by mouth daily. PATIENT MUST SCHEDULE APPOINTMENT FOR FUTURE REFILLS. FIRST ATTEMPT   ferrous sulfate  325 (65 FE) MG tablet Take 1 tablet (325 mg total) by mouth daily with breakfast.   fluticasone  50 MCG/ACT nasal spray Commonly known as: FLONASE  Place 1 spray into both nostrils daily. What changed:  when to take this reasons to take this   guaiFENesin -dextromethorphan  100-10 MG/5ML syrup Commonly known as: ROBITUSSIN DM Take 5 mLs by mouth every 4 (four) hours as needed for cough.   insulin  lispro 100 UNIT/ML KwikPen Commonly known as: HumaLOG  KwikPen Inject 7 Units into the skin 3 (three)  times daily before meals. What changed: how much to take   Lantus  SoloStar 100 UNIT/ML Solostar Pen Generic drug: insulin  glargine Inject 30 Units into the skin in the morning. What changed: how much to take   levothyroxine  200 MCG tablet Commonly known as: SYNTHROID  Take 200 mcg by mouth daily before breakfast.   losartan  100 MG tablet Commonly known as: COZAAR  Take 1 tablet (100 mg total) by mouth daily. What changed: how much to take   metoprolol  succinate 25  MG 24 hr tablet Commonly known as: TOPROL -XL Take 1 tablet (25 mg total) by mouth daily.   metoprolol  tartrate 25 MG tablet Commonly known as: LOPRESSOR  Take 25 mg by mouth daily.   pantoprazole  40 MG tablet Commonly known as: PROTONIX  Take 1 tablet (40 mg total) by mouth daily at 6 (six) AM.   polyethylene glycol 17 g packet Commonly known as: MIRALAX  / GLYCOLAX  Take 17 g by mouth daily as needed for mild constipation.   Restasis 0.05 % ophthalmic emulsion Generic drug: cycloSPORINE Place 1 drop into both eyes 2 (two) times daily as needed (for dry eyes).   traZODone  50 MG tablet Commonly known as: DESYREL  Take 50 mg by mouth at bedtime.        Discharge Exam: Filed Weights   02/16/24 1842  Weight: 96.7 kg   ***  Condition at discharge: {DC Condition:26389}  The results of significant diagnostics from this hospitalization (including imaging, microbiology, ancillary and laboratory) are listed below for reference.   Imaging Studies: DG Chest Port 1 View Result Date: 02/16/2024 EXAM: 1 VIEW(S) XRAY OF THE CHEST 02/16/2024 07:38:00 AM COMPARISON: 09/16/2022 CLINICAL HISTORY: 74 year old female with cough and flu-like symptoms. FINDINGS: LUNGS AND PLEURA: No focal pulmonary opacity. No pleural effusion. No pneumothorax. HEART AND MEDIASTINUM: No acute abnormality of the cardiac and mediastinal silhouettes. BONES AND SOFT TISSUES: No acute osseous abnormality. IMPRESSION: 1. No acute cardiopulmonary abnormality. Electronically signed by: Helayne Hurst MD 02/16/2024 07:46 AM EST RP Workstation: [id number]    Microbiology: Results for orders placed or performed during the hospital encounter of 02/15/24  Respiratory (~20 pathogens) panel by PCR     Status: Abnormal   Collection Time: 02/16/24  6:50 PM   Specimen: Nasopharyngeal Swab; Respiratory  Result Value Ref Range Status   Adenovirus NOT DETECTED NOT DETECTED Final   Coronavirus 229E NOT DETECTED NOT DETECTED Final     Comment: (NOTE) The Coronavirus on the Respiratory Panel, DOES NOT test for the novel  Coronavirus (2019 nCoV)    Coronavirus HKU1 NOT DETECTED NOT DETECTED Final   Coronavirus NL63 NOT DETECTED NOT DETECTED Final   Coronavirus OC43 NOT DETECTED NOT DETECTED Final   Metapneumovirus NOT DETECTED NOT DETECTED Final   Rhinovirus / Enterovirus DETECTED (A) NOT DETECTED Final   Influenza A H3 DETECTED (A) NOT DETECTED Final   Influenza B NOT DETECTED NOT DETECTED Final   Parainfluenza Virus 1 NOT DETECTED NOT DETECTED Final   Parainfluenza Virus 2 NOT DETECTED NOT DETECTED Final   Parainfluenza Virus 3 NOT DETECTED NOT DETECTED Final   Parainfluenza Virus 4 NOT DETECTED NOT DETECTED Final   Respiratory Syncytial Virus NOT DETECTED NOT DETECTED Final   Bordetella pertussis NOT DETECTED NOT DETECTED Final   Bordetella Parapertussis NOT DETECTED NOT DETECTED Final   Chlamydophila pneumoniae NOT DETECTED NOT DETECTED Final   Mycoplasma pneumoniae NOT DETECTED NOT DETECTED Final    Comment: Performed at [facility], [address] [place], [place] [address]  MRSA  Next Gen by PCR, Nasal     Status: None   Collection Time: 02/16/24  6:51 PM   Specimen: Nasal Mucosa; Nasal Swab  Result Value Ref Range Status   MRSA by PCR Next Gen NOT DETECTED NOT DETECTED Final    Comment: (NOTE) The GeneXpert MRSA Assay (FDA approved for NASAL specimens only), is one component of a comprehensive MRSA colonization surveillance program. It is not intended to diagnose MRSA infection nor to guide or monitor treatment for MRSA infections. Test performance is not FDA approved in patients less than 70 years old. Performed at [facility], [address] [place], [place] [address]     Labs: CBC: Recent Labs  Lab 02/15/24 2046 02/17/24 0310 02/19/24 0555  WBC 4.1 3.0* 3.7*  HGB 11.6* 10.6* 11.9*  HCT 37.2 31.0* 34.5*  MCV 91.0 82.9 82.9  PLT 284 253 243   Basic Metabolic  Panel: Recent Labs  Lab 02/16/24 1849 02/16/24 2215 02/17/24 0310 02/18/24 0649 02/19/24 0555  NA 136 135 136 133* 135  K 4.3 3.8 3.7 4.5 4.3  CL 99 101 102 99 102  CO2 21* 22 23 18* 22  GLUCOSE 147* 185* 144* 437* 227*  BUN 24* 22 21 12 12   CREATININE 1.52* 1.31* 1.32* 1.18* 1.10*  CALCIUM 9.4 9.0 8.7* 8.8* 9.2   Liver Function Tests: Recent Labs  Lab 02/16/24 0703 02/17/24 0310  AST 29 27  ALT 17 14  ALKPHOS 99 75  BILITOT 0.3 0.2  PROT 7.6 6.1*  ALBUMIN 4.0 3.3*   CBG: Recent Labs  Lab 02/18/24 1216 02/18/24 1647 02/18/24 1738 02/18/24 2036 02/19/24 0730  GLUCAP 269* 65* 80 121* 222*    Discharge time spent: {LESS THAN/GREATER THAN:26388} 30 minutes.  Signed: Concepcion Riser, MD [facility] 02/19/2024 "

## 2024-02-19 NOTE — TOC Progression Note (Addendum)
 Transition of Care [facility]) - Progression Note    Patient Details  Name: [name] MRN: [id number] Date of Birth: 10-12-1950  [facility] CM/SW Contact  Toy LITTIE Agar, RN Phone [phone]  02/19/2024, 11:54 AM  Clinical Narrative:    CM  received message from MD requesting [facility] referral. Cm spoke with patient to offer choice. Patient has nop preference. Referral sent pout via HUB and accepted bt [place] with [facility]. No DME needs noted per therapy documentation.  AVS has been updated. No other needs noted.    Expected Discharge Plan: Home w Home Health Services Barriers to Discharge: Continued Medical Work up               Expected Discharge Plan and Services In-house Referral: NA Discharge Planning Services: CM Consult Post Acute Care Choice: Durable Medical Equipment Living arrangements for the past 2 months: Single Family Home Expected Discharge Date: 02/19/24                                     Social Drivers of Health (SDOH) Interventions SDOH Screenings   Food Insecurity: No Food Insecurity (02/16/2024)  Housing: Low Risk (02/16/2024)  Transportation Needs: No Transportation Needs (02/16/2024)  Utilities: Not At Risk (02/16/2024)  Social Connections: Moderately Integrated (02/16/2024)  Tobacco Use: Low Risk (02/15/2024)    Readmission Risk Interventions     No data to display

## 2024-02-19 NOTE — TOC Transition Note (Signed)
 Transition of Care [facility]) - Discharge Note   Patient Details  Name: NAYDENE KAMROWSKI MRN: [id number] Date of Birth: [birth year]  [facility]) CM/SW Contact:  Toy LITTIE Agar, RN Phone Number:[phone]  02/19/2024, 12:04 PM   Clinical Narrative:    Patient discharging home. Home health has been set up. There are no other needs. AVS updated .      Barriers to Discharge: Continued Medical Work up   Patient Goals and CMS Choice Patient states their goals for this hospitalization and ongoing recovery are:: Return home CMS Medicare.gov Compare Post Acute Care list provided to:: Patient Choice offered to / list presented to : Patient  ownership interest in [facility].provided to:: Patient    Discharge Placement                       Discharge Plan and Services Additional resources added to the After Visit Summary for   In-house Referral: NA Discharge Planning Services: CM Consult Post Acute Care Choice: Durable Medical Equipment                               Social Drivers of Health (SDOH) Interventions SDOH Screenings   Food Insecurity: No Food Insecurity (02/16/2024)  Housing: Low Risk (02/16/2024)  Transportation Needs: No Transportation Needs (02/16/2024)  Utilities: Not At Risk (02/16/2024)  Social Connections: Moderately Integrated (02/16/2024)  Tobacco Use: Low Risk (02/15/2024)     Readmission Risk Interventions     No data to display

## 2024-02-19 NOTE — Plan of Care (Signed)
" °  Problem: Education: Goal: Ability to describe self-care measures that may prevent or decrease complications (Diabetes Survival Skills Education) will improve Outcome: Progressing   Problem: Coping: Goal: Ability to adjust to condition or change in health will improve Outcome: Progressing   Problem: Fluid Volume: Goal: Ability to maintain a balanced intake and output will improve Outcome: Progressing   Problem: Health Behavior/Discharge Planning: Goal: Ability to identify and utilize available resources and services will improve Outcome: Progressing Goal: Ability to manage health-related needs will improve Outcome: Progressing   Problem: Metabolic: Goal: Ability to maintain appropriate glucose levels will improve Outcome: Progressing   Problem: Nutritional: Goal: Maintenance of adequate nutrition will improve Outcome: Progressing   Problem: Health Behavior/Discharge Planning: Goal: Ability to identify and utilize available resources and services will improve Outcome: Progressing Goal: Ability to manage health-related needs will improve Outcome: Progressing   Problem: Metabolic: Goal: Ability to maintain appropriate glucose levels will improve Outcome: Progressing   Problem: Nutritional: Goal: Maintenance of adequate nutrition will improve Outcome: Progressing Goal: Maintenance of adequate weight for body size and type will improve Outcome: Progressing   "

## 2024-03-09 ENCOUNTER — Ambulatory Visit: Admitting: Podiatry

## 2024-03-09 DIAGNOSIS — Z91198 Patient's noncompliance with other medical treatment and regimen for other reason: Secondary | ICD-10-CM

## 2024-03-09 NOTE — Progress Notes (Signed)
 1. Failure to attend appointment with reason given    Patient rescheduled to tomorrow.

## 2024-03-10 ENCOUNTER — Encounter: Payer: Self-pay | Admitting: Podiatry

## 2024-03-10 ENCOUNTER — Ambulatory Visit: Admitting: Podiatry

## 2024-03-10 DIAGNOSIS — M79675 Pain in left toe(s): Secondary | ICD-10-CM | POA: Diagnosis not present

## 2024-03-10 DIAGNOSIS — Z794 Long term (current) use of insulin: Secondary | ICD-10-CM

## 2024-03-10 DIAGNOSIS — E119 Type 2 diabetes mellitus without complications: Secondary | ICD-10-CM

## 2024-03-10 DIAGNOSIS — B351 Tinea unguium: Secondary | ICD-10-CM

## 2024-03-10 DIAGNOSIS — M79674 Pain in right toe(s): Secondary | ICD-10-CM | POA: Diagnosis not present

## 2024-03-15 NOTE — Progress Notes (Signed)
"  °  Subjective:  Patient ID: [name], female    DOB: 16-Sep-1950,  MRN: [id number]  [name] presents to clinic today for preventative diabetic foot care for painful thick toenails that are difficult to trim. Pain interferes with ambulation. Aggravating factors include wearing enclosed shoe gear. Pain is relieved with periodic professional debridement. She is recovering from recent hospitalization. Chief Complaint  Patient presents with   [facility]    IDDM Patient with an A1C of 11.0 presents to office today for [facility] and Nail trim PCP: DENEDA DAYE LMV : 02/18/2024   New problem(s): None.   PCP is Rik Glinda DASEN, FNP.  Allergies[1]  Review of Systems: Negative except as noted in the HPI.  Objective: No changes noted in today's physical examination. There were no vitals filed for this visit. [name] is a pleasant 75 y.o. female WD, WN in NAD. AAO x 3. Vascular Examination: Vascular status intact b/l with palpable pedal pulses. CFT immediate b/l. Pedal hair present. No edema. No pain with calf compression b/l. Skin temperature gradient WNL b/l. No varicosities noted. No cyanosis or clubbing noted.  Neurological Examination: Sensation grossly intact b/l with 10 gram monofilament. Vibratory sensation intact b/l.  Dermatological Examination: Pedal skin with normal turgor, texture and tone b/l. No open wounds nor interdigital macerations noted. Toenails 1-5 b/l thick, discolored, elongated with subungual debris and pain on dorsal palpation. No hyperkeratotic lesions noted b/l.   Musculoskeletal Examination: Muscle strength 5/5 to b/l LE.  No pain, crepitus noted b/l. No gross pedal deformities. Patient ambulates independently without assistive aids.   Radiographs: None  Assessment/Plan: 1. Pain due to onychomycosis of toenails of both feet   2. Type 2 diabetes mellitus without complication, with long-term current use of insulin  [facility])   Patient was evaluated and treated.  All patient's and/or POA's questions/concerns addressed on today's visit. Mycotic toenails 1-5 b/l debrided in length and girth without incident.  Continue daily foot inspections and monitor blood glucose per PCP/Endocrinologist's recommendations.Continue soft, supportive shoe gear daily. Report any pedal injuries to medical professional. Call office if there are any quesitons/concerns. -Patient/POA to call should there be question/concern in the interim.   Return in about 3 months (around 06/08/2024).  [name], DPM      [facility] LOCATION: [address] [place] [address]                   Office [phone]   [facility] LOCATION: [address] [place], [place] [address] Office [phone]     [1] No Known Allergies  "

## 2024-03-23 ENCOUNTER — Encounter (HOSPITAL_BASED_OUTPATIENT_CLINIC_OR_DEPARTMENT_OTHER): Payer: Self-pay | Admitting: Pulmonary Disease

## 2024-03-23 DIAGNOSIS — R0683 Snoring: Secondary | ICD-10-CM

## 2024-03-23 DIAGNOSIS — G4700 Insomnia, unspecified: Secondary | ICD-10-CM

## 2024-06-15 ENCOUNTER — Ambulatory Visit: Admitting: Podiatry
